# Patient Record
Sex: Female | Born: 1965
Health system: Southern US, Community
[De-identification: ages and names within clinical notes are randomized; demographics above are authoritative.]

## PROBLEM LIST (undated history)

## (undated) DIAGNOSIS — E039 Hypothyroidism, unspecified: Secondary | ICD-10-CM

## (undated) DIAGNOSIS — N926 Irregular menstruation, unspecified: Secondary | ICD-10-CM

## (undated) DIAGNOSIS — T8859XA Other complications of anesthesia, initial encounter: Secondary | ICD-10-CM

## (undated) DIAGNOSIS — K5900 Constipation, unspecified: Secondary | ICD-10-CM

## (undated) DIAGNOSIS — T4145XA Adverse effect of unspecified anesthetic, initial encounter: Secondary | ICD-10-CM

## (undated) DIAGNOSIS — Z9889 Other specified postprocedural states: Secondary | ICD-10-CM

## (undated) DIAGNOSIS — N809 Endometriosis, unspecified: Secondary | ICD-10-CM

## (undated) DIAGNOSIS — K219 Gastro-esophageal reflux disease without esophagitis: Secondary | ICD-10-CM

## (undated) DIAGNOSIS — M797 Fibromyalgia: Secondary | ICD-10-CM

## (undated) DIAGNOSIS — R112 Nausea with vomiting, unspecified: Secondary | ICD-10-CM

## (undated) DIAGNOSIS — G4700 Insomnia, unspecified: Secondary | ICD-10-CM

## (undated) DIAGNOSIS — D649 Anemia, unspecified: Secondary | ICD-10-CM

## (undated) DIAGNOSIS — R030 Elevated blood-pressure reading, without diagnosis of hypertension: Secondary | ICD-10-CM

## (undated) HISTORY — DX: Elevated blood-pressure reading, without diagnosis of hypertension: R03.0

## (undated) HISTORY — DX: Hypothyroidism, unspecified: E03.9

## (undated) HISTORY — DX: Constipation, unspecified: K59.00

## (undated) HISTORY — DX: Fibromyalgia: M79.7

## (undated) HISTORY — PX: CHOLECYSTECTOMY: SHX55

## (undated) HISTORY — DX: Irregular menstruation, unspecified: N92.6

## (undated) HISTORY — DX: Endometriosis, unspecified: N80.9

---

## 2007-01-29 ENCOUNTER — Ambulatory Visit (HOSPITAL_COMMUNITY): Admission: RE | Admit: 2007-01-29 | Discharge: 2007-01-29 | Payer: Self-pay | Admitting: Obstetrics and Gynecology

## 2008-02-03 ENCOUNTER — Other Ambulatory Visit: Admission: RE | Admit: 2008-02-03 | Discharge: 2008-02-03 | Payer: Self-pay | Admitting: Obstetrics and Gynecology

## 2008-04-27 ENCOUNTER — Ambulatory Visit (HOSPITAL_COMMUNITY): Admission: RE | Admit: 2008-04-27 | Discharge: 2008-04-27 | Payer: Self-pay | Admitting: Obstetrics and Gynecology

## 2009-02-16 ENCOUNTER — Other Ambulatory Visit: Admission: RE | Admit: 2009-02-16 | Discharge: 2009-02-16 | Payer: Self-pay | Admitting: Obstetrics and Gynecology

## 2009-02-16 ENCOUNTER — Encounter: Admission: RE | Admit: 2009-02-16 | Discharge: 2009-02-16 | Payer: Self-pay | Admitting: Allergy and Immunology

## 2009-02-18 ENCOUNTER — Ambulatory Visit (HOSPITAL_COMMUNITY): Admission: RE | Admit: 2009-02-18 | Discharge: 2009-02-18 | Payer: Self-pay | Admitting: Obstetrics & Gynecology

## 2009-09-03 ENCOUNTER — Inpatient Hospital Stay (HOSPITAL_COMMUNITY): Admission: EM | Admit: 2009-09-03 | Discharge: 2009-09-06 | Payer: Self-pay | Admitting: Emergency Medicine

## 2009-09-05 ENCOUNTER — Encounter (INDEPENDENT_AMBULATORY_CARE_PROVIDER_SITE_OTHER): Payer: Self-pay | Admitting: Internal Medicine

## 2010-03-09 ENCOUNTER — Other Ambulatory Visit: Admission: RE | Admit: 2010-03-09 | Discharge: 2010-03-09 | Payer: Self-pay | Admitting: Obstetrics and Gynecology

## 2010-03-15 ENCOUNTER — Ambulatory Visit (HOSPITAL_COMMUNITY): Admission: RE | Admit: 2010-03-15 | Discharge: 2010-03-15 | Payer: Self-pay | Admitting: Obstetrics & Gynecology

## 2010-05-03 ENCOUNTER — Ambulatory Visit (HOSPITAL_COMMUNITY)
Admission: RE | Admit: 2010-05-03 | Discharge: 2010-05-03 | Payer: Self-pay | Source: Home / Self Care | Attending: Obstetrics & Gynecology | Admitting: Obstetrics & Gynecology

## 2010-05-22 ENCOUNTER — Encounter: Payer: Self-pay | Admitting: Family Medicine

## 2010-07-19 LAB — COMPREHENSIVE METABOLIC PANEL
ALT: 44 U/L — ABNORMAL HIGH (ref 0–35)
ALT: 80 U/L — ABNORMAL HIGH (ref 0–35)
AST: 106 U/L — ABNORMAL HIGH (ref 0–37)
AST: 26 U/L (ref 0–37)
Albumin: 2.8 g/dL — ABNORMAL LOW (ref 3.5–5.2)
Albumin: 2.8 g/dL — ABNORMAL LOW (ref 3.5–5.2)
Albumin: 3.5 g/dL (ref 3.5–5.2)
Alkaline Phosphatase: 39 U/L (ref 39–117)
Alkaline Phosphatase: 51 U/L (ref 39–117)
Alkaline Phosphatase: 78 U/L (ref 39–117)
BUN: 4 mg/dL — ABNORMAL LOW (ref 6–23)
BUN: 6 mg/dL (ref 6–23)
CO2: 22 mEq/L (ref 19–32)
Calcium: 7.8 mg/dL — ABNORMAL LOW (ref 8.4–10.5)
Calcium: 8.6 mg/dL (ref 8.4–10.5)
Chloride: 109 mEq/L (ref 96–112)
Chloride: 109 mEq/L (ref 96–112)
Chloride: 110 mEq/L (ref 96–112)
Creatinine, Ser: 0.64 mg/dL (ref 0.4–1.2)
Creatinine, Ser: 0.74 mg/dL (ref 0.4–1.2)
GFR calc Af Amer: >60 mL/min (ref >60)
GFR calc Af Amer: >60 mL/min (ref >60)
GFR calc Af Amer: >60 mL/min (ref >60)
GFR calc non Af Amer: >60 mL/min (ref >60)
Glucose, Bld: 106 mg/dL — ABNORMAL HIGH (ref 70–99)
Glucose, Bld: 89 mg/dL (ref 70–99)
Potassium: 3.6 mEq/L (ref 3.5–5.1)
Potassium: 4.3 mEq/L (ref 3.5–5.1)
Sodium: 138 mEq/L (ref 135–145)
Sodium: 139 mEq/L (ref 135–145)
Total Bilirubin: 0.3 mg/dL (ref 0.3–1.2)
Total Bilirubin: 0.8 mg/dL (ref 0.3–1.2)
Total Bilirubin: 1.1 mg/dL (ref 0.3–1.2)
Total Protein: 5.7 g/dL — ABNORMAL LOW (ref 6.0–8.3)
Total Protein: 7 g/dL (ref 6.0–8.3)

## 2010-07-19 LAB — DIFFERENTIAL
Basophils Absolute: 0 10*3/uL (ref 0.0–0.1)
Basophils Relative: 0 % (ref 0–1)
Eosinophils Absolute: 0.1 10*3/uL (ref 0.0–0.7)
Eosinophils Relative: 1 % (ref 0–5)
Lymphocytes Relative: 28 % (ref 12–46)
Lymphs Abs: 2.4 10*3/uL (ref 0.7–4.0)
Lymphs Abs: 3.2 10*3/uL (ref 0.7–4.0)
Monocytes Absolute: 0.4 10*3/uL (ref 0.1–1.0)
Monocytes Absolute: 0.8 10*3/uL (ref 0.1–1.0)
Monocytes Relative: 7 % (ref 3–12)
Neutro Abs: 5.5 10*3/uL (ref 1.7–7.7)

## 2010-07-19 LAB — CBC
HCT: 29.3 % — ABNORMAL LOW (ref 36.0–46.0)
HCT: 31.2 % — ABNORMAL LOW (ref 36.0–46.0)
Hemoglobin: 12.4 g/dL (ref 12.0–15.0)
Hemoglobin: 9.7 g/dL — ABNORMAL LOW (ref 12.0–15.0)
Hemoglobin: 9.7 g/dL — ABNORMAL LOW (ref 12.0–15.0)
MCHC: 32.3 g/dL (ref 30.0–36.0)
MCHC: 33.1 g/dL (ref 30.0–36.0)
MCV: 87.5 fL (ref 78.0–100.0)
MCV: 87.8 fL (ref 78.0–100.0)
MCV: 88.1 fL (ref 78.0–100.0)
Platelets: 201 10*3/uL (ref 150–400)
Platelets: 208 10*3/uL (ref 150–400)
RBC: 3.38 MIL/uL — ABNORMAL LOW (ref 3.87–5.11)
RDW: 13.5 % (ref 11.5–15.5)
RDW: 13.6 % (ref 11.5–15.5)
RDW: 13.7 % (ref 11.5–15.5)
WBC: 6.6 10*3/uL (ref 4.0–10.5)
WBC: 8.4 10*3/uL (ref 4.0–10.5)

## 2010-07-19 LAB — URINALYSIS, ROUTINE W REFLEX MICROSCOPIC
Bilirubin Urine: NEGATIVE
Glucose, UA: NEGATIVE mg/dL
Ketones, ur: NEGATIVE mg/dL
Nitrite: NEGATIVE
Specific Gravity, Urine: 1.003 — ABNORMAL LOW (ref 1.005–1.030)
pH: 7 (ref 5.0–8.0)

## 2010-07-19 LAB — URINE MICROSCOPIC-ADD ON

## 2010-07-19 LAB — PREGNANCY, URINE: Preg Test, Ur: NEGATIVE

## 2010-07-19 LAB — LIPASE, BLOOD
Lipase: 22 U/L (ref 11–59)
Lipase: 647 U/L — ABNORMAL HIGH (ref 11–59)

## 2011-04-18 IMAGING — RF DG CHOLANGIOGRAM OPERATIVE
1 series · 4 of 4 positions shown · non-contrast
Comparison: None.

CLINICAL DATA: Gallstones

INTRAOPERATIVE CHOLANGIOGRAM
TECHNIQUE: Cholangiographic images from the C-arm fluoroscopic
device were submitted for interpretation post-operatively.  Please
see the procedural report for the amount of contrast and the
fluoroscopy time utilized.

[Series 1: run · 4 of 92 frames shown]
[frame 2/92]
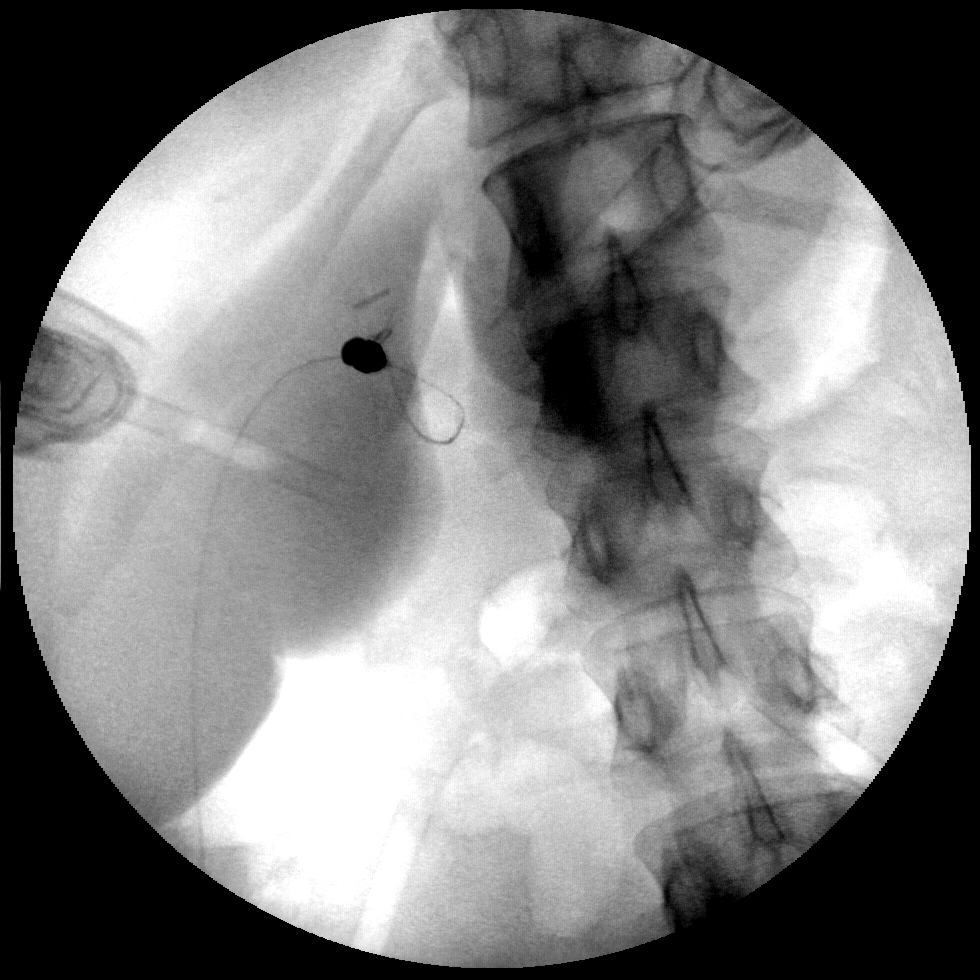
[frame 14/92]
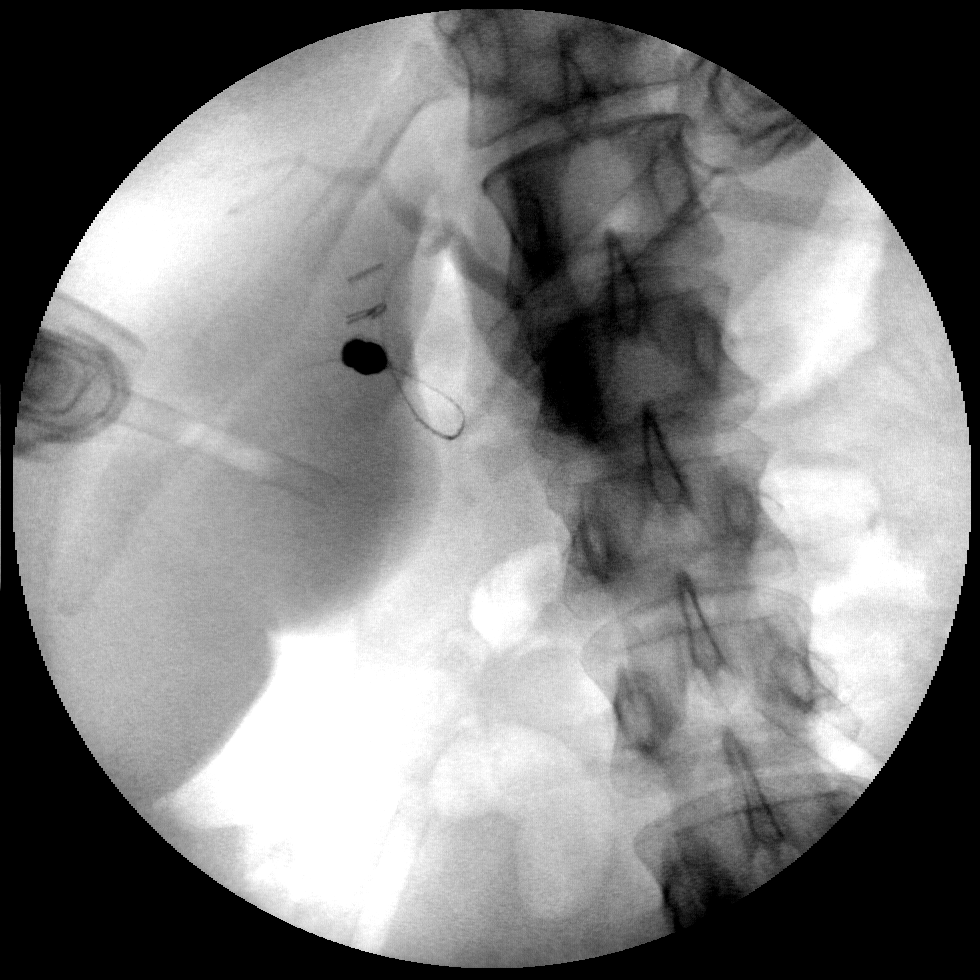
[frame 47/92]
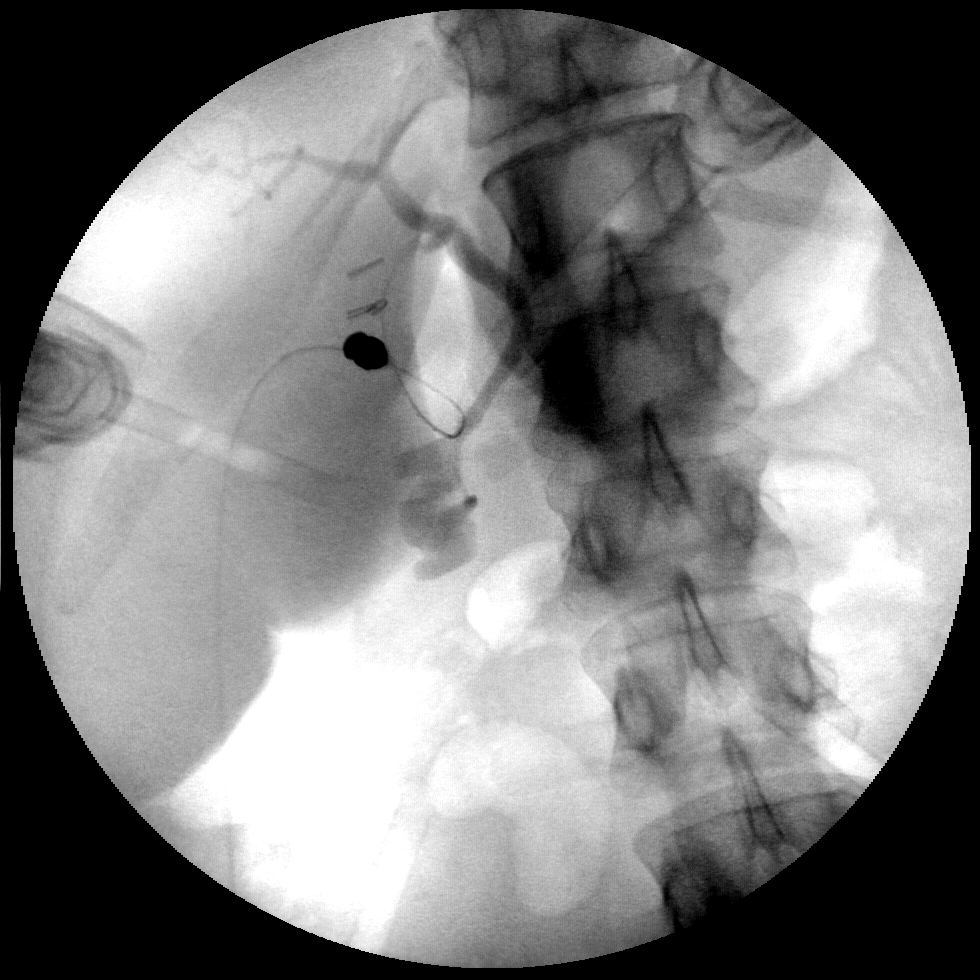
[frame 79/92]
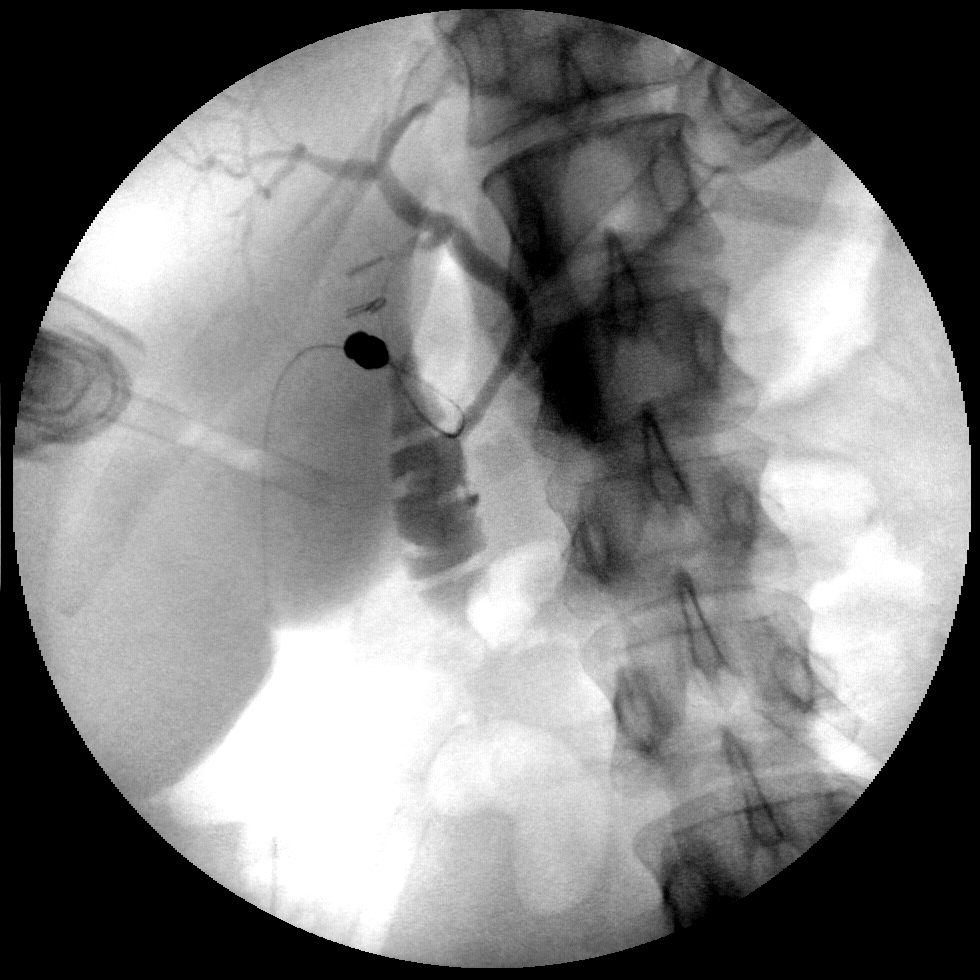

[4 of 4 positions shown; findings below may reference images not displayed]

FINDINGS: Contrast fills the biliary tree and duodenum compatible
patency.  No filling defects are present to suggest duct stones.
IMPRESSION: Biliary system is patent.  No definite duct stones.

## 2011-04-25 ENCOUNTER — Encounter: Payer: Self-pay | Admitting: Emergency Medicine

## 2011-04-25 ENCOUNTER — Emergency Department (HOSPITAL_COMMUNITY): Payer: 59

## 2011-04-25 ENCOUNTER — Other Ambulatory Visit: Payer: Self-pay

## 2011-04-25 ENCOUNTER — Emergency Department (HOSPITAL_COMMUNITY)
Admission: EM | Admit: 2011-04-25 | Discharge: 2011-04-25 | Disposition: A | Discharge status: Home / Self Care | Payer: 59 | Attending: Emergency Medicine | Admitting: Emergency Medicine

## 2011-04-25 DIAGNOSIS — K219 Gastro-esophageal reflux disease without esophagitis: Secondary | ICD-10-CM | POA: Insufficient documentation

## 2011-04-25 DIAGNOSIS — J45909 Unspecified asthma, uncomplicated: Secondary | ICD-10-CM | POA: Insufficient documentation

## 2011-04-25 DIAGNOSIS — R6884 Jaw pain: Secondary | ICD-10-CM | POA: Insufficient documentation

## 2011-04-25 DIAGNOSIS — R079 Chest pain, unspecified: Secondary | ICD-10-CM | POA: Insufficient documentation

## 2011-04-25 DIAGNOSIS — R112 Nausea with vomiting, unspecified: Secondary | ICD-10-CM | POA: Insufficient documentation

## 2011-04-25 DIAGNOSIS — E039 Hypothyroidism, unspecified: Secondary | ICD-10-CM | POA: Insufficient documentation

## 2011-04-25 DIAGNOSIS — J029 Acute pharyngitis, unspecified: Secondary | ICD-10-CM | POA: Insufficient documentation

## 2011-04-25 DIAGNOSIS — H9209 Otalgia, unspecified ear: Secondary | ICD-10-CM | POA: Insufficient documentation

## 2011-04-25 DIAGNOSIS — R109 Unspecified abdominal pain: Secondary | ICD-10-CM | POA: Insufficient documentation

## 2011-04-25 HISTORY — DX: Gastro-esophageal reflux disease without esophagitis: K21.9

## 2011-04-25 HISTORY — DX: Anemia, unspecified: D64.9

## 2011-04-25 HISTORY — DX: Insomnia, unspecified: G47.00

## 2011-04-25 HISTORY — DX: Hypothyroidism, unspecified: E03.9

## 2011-04-25 LAB — COMPREHENSIVE METABOLIC PANEL
AST: 12 U/L (ref 0–37)
Albumin: 3.2 g/dL — ABNORMAL LOW (ref 3.5–5.2)
BUN: 11 mg/dL (ref 6–23)
Calcium: 8.8 mg/dL (ref 8.4–10.5)
Creatinine, Ser: 0.66 mg/dL (ref 0.50–1.10)
Total Bilirubin: 0.3 mg/dL (ref 0.3–1.2)
Total Protein: 7 g/dL (ref 6.0–8.3)

## 2011-04-25 LAB — URINALYSIS, ROUTINE W REFLEX MICROSCOPIC
Glucose, UA: NEGATIVE mg/dL
Ketones, ur: NEGATIVE mg/dL
Leukocytes, UA: NEGATIVE
Nitrite: NEGATIVE
Protein, ur: NEGATIVE mg/dL

## 2011-04-25 LAB — DIFFERENTIAL
Basophils Absolute: 0 10*3/uL (ref 0.0–0.1)
Lymphocytes Relative: 20 % (ref 12–46)
Lymphs Abs: 3.4 10*3/uL (ref 0.7–4.0)
Monocytes Absolute: 1.1 10*3/uL — ABNORMAL HIGH (ref 0.1–1.0)
Neutro Abs: 12.2 10*3/uL — ABNORMAL HIGH (ref 1.7–7.7)

## 2011-04-25 LAB — CBC
HCT: 35.7 % — ABNORMAL LOW (ref 36.0–46.0)
Hemoglobin: 11.8 g/dL — ABNORMAL LOW (ref 12.0–15.0)
MCV: 84.4 fL (ref 78.0–100.0)
RBC: 4.23 MIL/uL (ref 3.87–5.11)
RDW: 13.7 % (ref 11.5–15.5)
WBC: 16.8 10*3/uL — ABNORMAL HIGH (ref 4.0–10.5)

## 2011-04-25 LAB — POCT PREGNANCY, URINE: Preg Test, Ur: NEGATIVE

## 2011-04-25 LAB — URINE MICROSCOPIC-ADD ON

## 2011-04-25 LAB — LIPASE, BLOOD: Lipase: 26 U/L (ref 11–59)

## 2011-04-25 MED ORDER — ONDANSETRON HCL 4 MG PO TABS: 4.0000 mg | ORAL_TABLET | Freq: Four times a day (QID) | ORAL | Status: AC | PRN

## 2011-04-25 MED ORDER — GI COCKTAIL ~~LOC~~
ORAL | Status: AC
  Filled 2011-04-25: qty 30

## 2011-04-25 MED ORDER — ONDANSETRON 4 MG PO TBDP
ORAL_TABLET | ORAL | Status: AC
  Filled 2011-04-25: qty 2

## 2011-04-25 MED ORDER — GI COCKTAIL ~~LOC~~
30.0000 mL | Freq: Once | ORAL | Status: AC
  Administered 2011-04-25: 30 mL via ORAL

## 2011-04-25 MED ORDER — SODIUM CHLORIDE 0.9 % IV BOLUS (SEPSIS)
1000.0000 mL | Freq: Once | INTRAVENOUS | Status: AC
  Administered 2011-04-25: 1000 mL via INTRAVENOUS

## 2011-04-25 MED ORDER — ONDANSETRON 4 MG PO TBDP
8.0000 mg | ORAL_TABLET | Freq: Once | ORAL | Status: AC
  Administered 2011-04-25: 8 mg via ORAL

## 2011-04-25 NOTE — ED Notes (Signed)
Pt reports became nauseated and threw up once. Reports feeling of something in throat but cannot clear it. Pain in ears, back, behind neck, shaky but not cold. Pt reports jaw and throat sore especially when swallowing.

## 2011-04-25 NOTE — ED Provider Notes (Signed)
History     CSN: [id number]  Arrival date & time 04/25/11  0123    Chief Complaint  Patient presents with  . Abdominal Pain    HPI Pt was seen at 0145.  Per pt, c/o sudden onset and resolution of one episode of N/V that began PTA.  Pt states she has felt "shakey" since vomiting, as well as has pain in the side of her jaw, ears, sore throat as well as her lower lateral ribs.  Sore throat worsens with swallowing.  Denies diarrhea, no CP/palpitations, no SOB/cough, no fevers, no back pain, no rash.     Past Medical History  Diagnosis Date  . Asthma   . GERD (gastroesophageal reflux disease)   . Insomnia   . Anemia   . Hypothyroidism     Past Surgical History  Procedure Date  . Cholecystectomy     History  Substance Use Topics  . Smoking status: Never Smoker   . Smokeless tobacco: Not on file  . Alcohol Use: Yes   Review of Systems ROS: Statement: All systems negative except as marked or noted in the HPI; Constitutional: Negative for fever and chills. ; ; Eyes: Negative for eye pain, redness and discharge. ; ; ENMT: Negative for ear pain, hoarseness, nasal congestion, sinus pressure and +sore throat. ; ; Cardiovascular: Negative for chest pain, palpitations, diaphoresis, dyspnea and peripheral edema. ; ; Respiratory: Negative for cough, wheezing and stridor. ; ; Gastrointestinal: +N/V.  Negative for diarrhea, abdominal pain, blood in stool, hematemesis, jaundice and rectal bleeding. . ; ; Genitourinary: Negative for dysuria, flank pain and hematuria. ; ; Musculoskeletal: Negative for back pain and neck pain. Negative for swelling and trauma.; ; Skin: Negative for pruritus, rash, abrasions, blisters, bruising and skin lesion.; ; Neuro: Negative for headache, lightheadedness and neck stiffness. Negative for weakness, altered level of consciousness , altered mental status, extremity weakness, paresthesias, involuntary movement, seizure and syncope.     Allergies  Review of  patient's allergies indicates no known allergies.  Home Medications   Current Outpatient Rx  Name Route Sig Dispense Refill  . ALBUTEROL SULFATE HFA 108 (90 BASE) MCG/ACT IN AERS Inhalation Inhale 2 puffs into the lungs every 6 (six) hours as needed. For breathing     . BUDESONIDE-FORMOTEROL FUMARATE 80-4.5 MCG/ACT IN AERO Inhalation Inhale 2 puffs into the lungs 2 (two) times daily.      Marland Kitchen LEVOTHYROXINE SODIUM 100 MCG PO TABS Oral Take 100 mcg by mouth daily.      Marland Kitchen LORATADINE 10 MG PO TABS Oral Take 10 mg by mouth daily.      Vladimir Creeks ESTRAD-FE 1-20/1-30/1-35 MG-MCG PO TABS Oral Take 1 tablet by mouth daily.        BP 154/85  Pulse 131  Temp(Src) 98.2 F (36.8 C) (Oral)  Resp 18  SpO2 97%  Physical Exam 0150: Physical examination:  Nursing notes reviewed; Vital signs and O2 SAT reviewed;  Constitutional: Well developed, Well nourished, Well hydrated, In no acute distress; Head:  Normocephalic, atraumatic; Eyes: EOMI, PERRL, No scleral icterus; ENMT: Mouth and pharynx normal, Mucous membranes moist; Neck: Supple, Full range of motion, No lymphadenopathy; Cardiovascular: Tachycardic rate and rhythm, No murmur or gallop; Respiratory: Breath sounds clear & equal bilaterally, No rales, rhonchi, wheezes, or rub, Normal respiratory effort/excursion; Chest: Nontender, Movement normal; Abdomen: Soft, Nontender, Nondistended, Normal bowel sounds; Genitourinary: No CVA tenderness; Extremities: Pulses normal, No tenderness, No edema, No calf edema or asymmetry.; Neuro: AA&Ox3, Major CN grossly  intact.  No gross focal motor or sensory deficits in extremities.; Skin: Color normal, Warm, Dry, no rash.  Psych:  Very anxious.    ED Course  Procedures    MDM  MDM Reviewed: nursing note and vitals Interpretation: ECG, labs and x-ray    Date: 04/25/2011  Rate: 93  Rhythm: normal sinus rhythm  QRS Axis: normal  Intervals: normal  ST/T Wave abnormalities: normal  Conduction  Disutrbances:none  Narrative Interpretation:   Old EKG Reviewed: none available.  Results for orders placed during the hospital encounter of 04/25/11  URINALYSIS, ROUTINE W REFLEX MICROSCOPIC      Component Value Range   Color, Urine YELLOW  YELLOW    APPearance TURBID (*) CLEAR    Specific Gravity, Urine 1.030  1.005 - 1.030    pH 5.0  5.0 - 8.0    Glucose, UA NEGATIVE  NEGATIVE (mg/dL)   Hgb urine dipstick TRACE (*) NEGATIVE    Bilirubin Urine NEGATIVE  NEGATIVE    Ketones, ur NEGATIVE  NEGATIVE (mg/dL)   Protein, ur NEGATIVE  NEGATIVE (mg/dL)   Urobilinogen, UA 0.2  0.0 - 1.0 (mg/dL)   Nitrite NEGATIVE  NEGATIVE    Leukocytes, UA NEGATIVE  NEGATIVE   POCT PREGNANCY, URINE      Component Value Range   Preg Test, Ur NEGATIVE    COMPREHENSIVE METABOLIC PANEL      Component Value Range   Sodium 136  135 - 145 (mEq/L)   Potassium 3.4 (*) 3.5 - 5.1 (mEq/L)   Chloride 102  96 - 112 (mEq/L)   CO2 22  19 - 32 (mEq/L)   Glucose, Bld 116 (*) 70 - 99 (mg/dL)   BUN 11  6 - 23 (mg/dL)   Creatinine, Ser 6.96  0.50 - 1.10 (mg/dL)   Calcium 8.8  8.4 - 29.5 (mg/dL)   Total Protein 7.0  6.0 - 8.3 (g/dL)   Albumin 3.2 (*) 3.5 - 5.2 (g/dL)   AST 12  0 - 37 (U/L)   ALT 10  0 - 35 (U/L)   Alkaline Phosphatase 46  39 - 117 (U/L)   Total Bilirubin 0.3  0.3 - 1.2 (mg/dL)   GFR calc non Af Amer >90  >90 (mL/min)   GFR calc Af Amer >90  >90 (mL/min)  LIPASE, BLOOD      Component Value Range   Lipase 26  11 - 59 (U/L)  CBC      Component Value Range   WBC 16.8 (*) 4.0 - 10.5 (K/uL)   RBC 4.23  3.87 - 5.11 (MIL/uL)   Hemoglobin 11.8 (*) 12.0 - 15.0 (g/dL)   HCT 28.4 (*) 13.2 - 46.0 (%)   MCV 84.4  78.0 - 100.0 (fL)   MCH 27.9  26.0 - 34.0 (pg)   MCHC 33.1  30.0 - 36.0 (g/dL)   RDW 44.0  10.2 - 72.5 (%)   Platelets 248  150 - 400 (K/uL)  DIFFERENTIAL      Component Value Range   Neutrophils Relative 72  43 - 77 (%)   Neutro Abs 12.2 (*) 1.7 - 7.7 (K/uL)   Lymphocytes Relative 20  12 -  46 (%)   Lymphs Abs 3.4  0.7 - 4.0 (K/uL)   Monocytes Relative 6  3 - 12 (%)   Monocytes Absolute 1.1 (*) 0.1 - 1.0 (K/uL)   Eosinophils Relative 1  0 - 5 (%)   Eosinophils Absolute 0.2  0.0 - 0.7 (K/uL)   Basophils  Relative 0  0 - 1 (%)   Basophils Absolute 0.0  0.0 - 0.1 (K/uL)  RAPID STREP SCREEN      Component Value Range   Streptococcus, Group A Screen (Direct) NEGATIVE  NEGATIVE   URINE MICROSCOPIC-ADD ON      Component Value Range   Squamous Epithelial / LPF MANY (*) RARE    WBC, UA 0-2  <3 (WBC/hpf)   RBC / HPF 3-6  <3 (RBC/hpf)   Bacteria, UA MANY (*) RARE    Urine-Other MUCOUS PRESENT    POCT I-STAT TROPONIN I      Component Value Range   Troponin i, poc 0.00  0.00 - 0.08 (ng/mL)   Comment 3            Dg Neck Soft Tissue 04/25/2011  *RADIOLOGY REPORT*  Clinical Data: Throat pain; multiple episodes of vomiting.  NECK SOFT TISSUES - 1+ VIEW  Comparison: None.  Findings: The nasopharynx, oropharynx and hypopharynx are unremarkable in appearance.  The epiglottis appears grossly normal in thickness, though not well assessed due to its position.  The aryepiglottic folds are within normal limits.  The proximal trachea is normal in appearance.  Prevertebral soft tissues are normal in thickness.  The visualized paranasal sinuses and mastoid air cells are well- aerated.  No acute osseous abnormalities are seen.  Mild degenerative change is noted about the dens.  IMPRESSION: No significant abnormalities seen with respect to the soft tissues of the neck.  Original Report Authenticated By: Tonia Ghent, M.D.   Dg Chest 2 View 04/25/2011  *RADIOLOGY REPORT*  Clinical Data: Shortness of breath and jaw pain.  CHEST - 2 VIEW  Comparison: Chest radiograph performed 02/16/2009  Findings: The lungs are well-aerated and clear.  There is no evidence of focal opacification, pleural effusion or pneumothorax.  The heart is borderline normal in size; the mediastinal contour is within normal limits.  No  acute osseous abnormalities are seen. Clips are noted within the right upper quadrant, reflecting prior cholecystectomy.  IMPRESSION: No acute cardiopulmonary process seen.  Original Report Authenticated By: Tonia Ghent, M.D.    3:47 AM:  UA contaminated.  Feels better after meds.  Has tol PO well in ED without N/V.  Pt has ambulated to the BR with easy resps, steady gait, NAD.  Appears less anxious.  HR improved to 90's.  Wants to go home now.  Dx testing d/w pt and family.  Questions answered.  Verb understanding, agreeable to d/c home with outpt f/u.       Brett Darko Allison Quarry, DO 04/26/11 [phone]

## 2011-04-25 NOTE — ED Notes (Signed)
Pt reports no nausea at this time. Pt reports tenderness under ribs.

## 2011-04-25 NOTE — ED Notes (Signed)
Patient transported to X-ray 

## 2011-04-25 NOTE — ED Notes (Signed)
EKG GIVEN TO DR [facility]

## 2011-05-08 ENCOUNTER — Other Ambulatory Visit: Payer: Self-pay | Admitting: Obstetrics and Gynecology

## 2011-05-08 DIAGNOSIS — Z139 Encounter for screening, unspecified: Secondary | ICD-10-CM

## 2011-05-22 ENCOUNTER — Ambulatory Visit (HOSPITAL_COMMUNITY)
Admission: RE | Admit: 2011-05-22 | Discharge: 2011-05-22 | Disposition: A | Discharge status: Home / Self Care | Payer: 59 | Source: Ambulatory Visit | Attending: Obstetrics and Gynecology | Admitting: Obstetrics and Gynecology

## 2011-05-22 DIAGNOSIS — Z139 Encounter for screening, unspecified: Secondary | ICD-10-CM

## 2011-05-22 DIAGNOSIS — Z1231 Encounter for screening mammogram for malignant neoplasm of breast: Secondary | ICD-10-CM | POA: Insufficient documentation

## 2011-06-05 ENCOUNTER — Other Ambulatory Visit (HOSPITAL_COMMUNITY)
Admission: RE | Admit: 2011-06-05 | Discharge: 2011-06-05 | Disposition: A | Discharge status: Home / Self Care | Payer: 59 | Source: Ambulatory Visit | Attending: Obstetrics and Gynecology | Admitting: Obstetrics and Gynecology

## 2011-06-05 ENCOUNTER — Other Ambulatory Visit: Payer: Self-pay | Admitting: Adult Health

## 2011-06-05 DIAGNOSIS — Z01419 Encounter for gynecological examination (general) (routine) without abnormal findings: Secondary | ICD-10-CM | POA: Insufficient documentation

## 2011-06-05 DIAGNOSIS — Z113 Encounter for screening for infections with a predominantly sexual mode of transmission: Secondary | ICD-10-CM | POA: Insufficient documentation

## 2012-03-30 ENCOUNTER — Other Ambulatory Visit (HOSPITAL_COMMUNITY): Payer: Self-pay | Admitting: Family Medicine

## 2012-03-30 ENCOUNTER — Ambulatory Visit (HOSPITAL_COMMUNITY)
Admission: RE | Admit: 2012-03-30 | Discharge: 2012-03-30 | Disposition: A | Discharge status: Home / Self Care | Payer: 59 | Source: Ambulatory Visit | Attending: Family Medicine | Admitting: Family Medicine

## 2012-03-30 DIAGNOSIS — S8990XA Unspecified injury of unspecified lower leg, initial encounter: Secondary | ICD-10-CM | POA: Insufficient documentation

## 2012-03-30 DIAGNOSIS — S99919A Unspecified injury of unspecified ankle, initial encounter: Secondary | ICD-10-CM | POA: Insufficient documentation

## 2012-03-30 DIAGNOSIS — X58XXXA Exposure to other specified factors, initial encounter: Secondary | ICD-10-CM | POA: Insufficient documentation

## 2012-03-30 DIAGNOSIS — T1490XA Injury, unspecified, initial encounter: Secondary | ICD-10-CM

## 2012-05-06 ENCOUNTER — Other Ambulatory Visit: Payer: Self-pay | Admitting: Adult Health

## 2012-05-06 DIAGNOSIS — Z139 Encounter for screening, unspecified: Secondary | ICD-10-CM

## 2012-05-28 ENCOUNTER — Ambulatory Visit (HOSPITAL_COMMUNITY)
Admission: RE | Admit: 2012-05-28 | Discharge: 2012-05-28 | Disposition: A | Discharge status: Home / Self Care | Payer: 59 | Source: Ambulatory Visit | Attending: Adult Health | Admitting: Adult Health

## 2012-05-28 DIAGNOSIS — Z1231 Encounter for screening mammogram for malignant neoplasm of breast: Secondary | ICD-10-CM | POA: Insufficient documentation

## 2012-05-28 DIAGNOSIS — Z139 Encounter for screening, unspecified: Secondary | ICD-10-CM

## 2012-12-05 ENCOUNTER — Encounter: Payer: Self-pay | Admitting: Adult Health

## 2012-12-05 ENCOUNTER — Ambulatory Visit (INDEPENDENT_AMBULATORY_CARE_PROVIDER_SITE_OTHER): Payer: 59 | Admitting: Adult Health

## 2012-12-05 VITALS — BP 120/88 | Ht 60.0 in | Wt 203.0 lb

## 2012-12-05 DIAGNOSIS — E039 Hypothyroidism, unspecified: Secondary | ICD-10-CM

## 2012-12-05 DIAGNOSIS — K5900 Constipation, unspecified: Secondary | ICD-10-CM

## 2012-12-05 DIAGNOSIS — N926 Irregular menstruation, unspecified: Secondary | ICD-10-CM

## 2012-12-05 HISTORY — DX: Constipation, unspecified: K59.00

## 2012-12-05 HISTORY — DX: Hypothyroidism, unspecified: E03.9

## 2012-12-05 HISTORY — DX: Irregular menstruation, unspecified: N92.6

## 2012-12-05 LAB — CBC
HCT: 36.1 % (ref 36.0–46.0)
Hemoglobin: 11.7 g/dL — ABNORMAL LOW (ref 12.0–15.0)
MCV: 84.3 fL (ref 78.0–100.0)
RDW: 14.3 % (ref 11.5–15.5)
WBC: 10.7 10*3/uL — ABNORMAL HIGH (ref 4.0–10.5)

## 2012-12-05 IMAGING — CR DG CHEST 2V
2 series · 2 of 2 positions shown · non-contrast
Comparison: Chest radiograph performed 02/16/2009

CLINICAL DATA: Shortness of breath and jaw pain.

CHEST - 2 VIEW

[w chest pa]
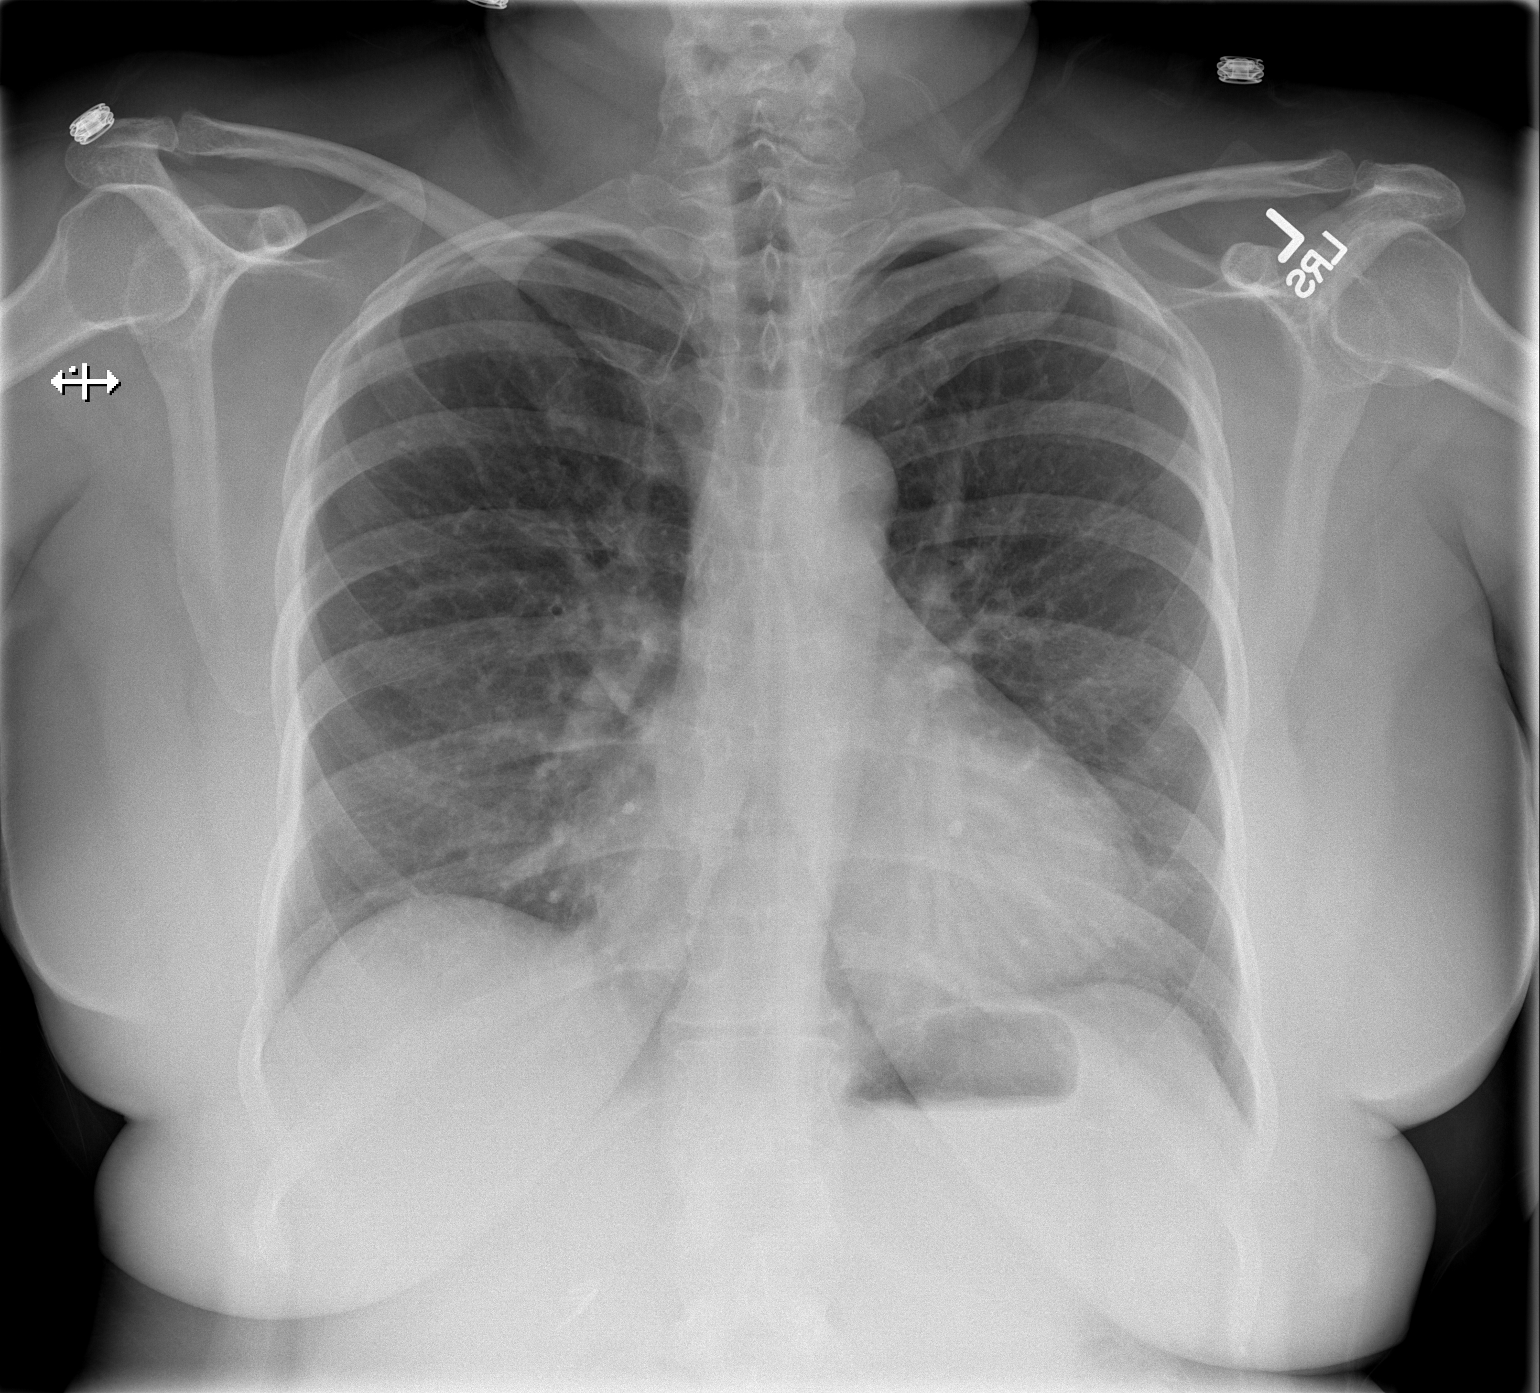

[w chest lat]
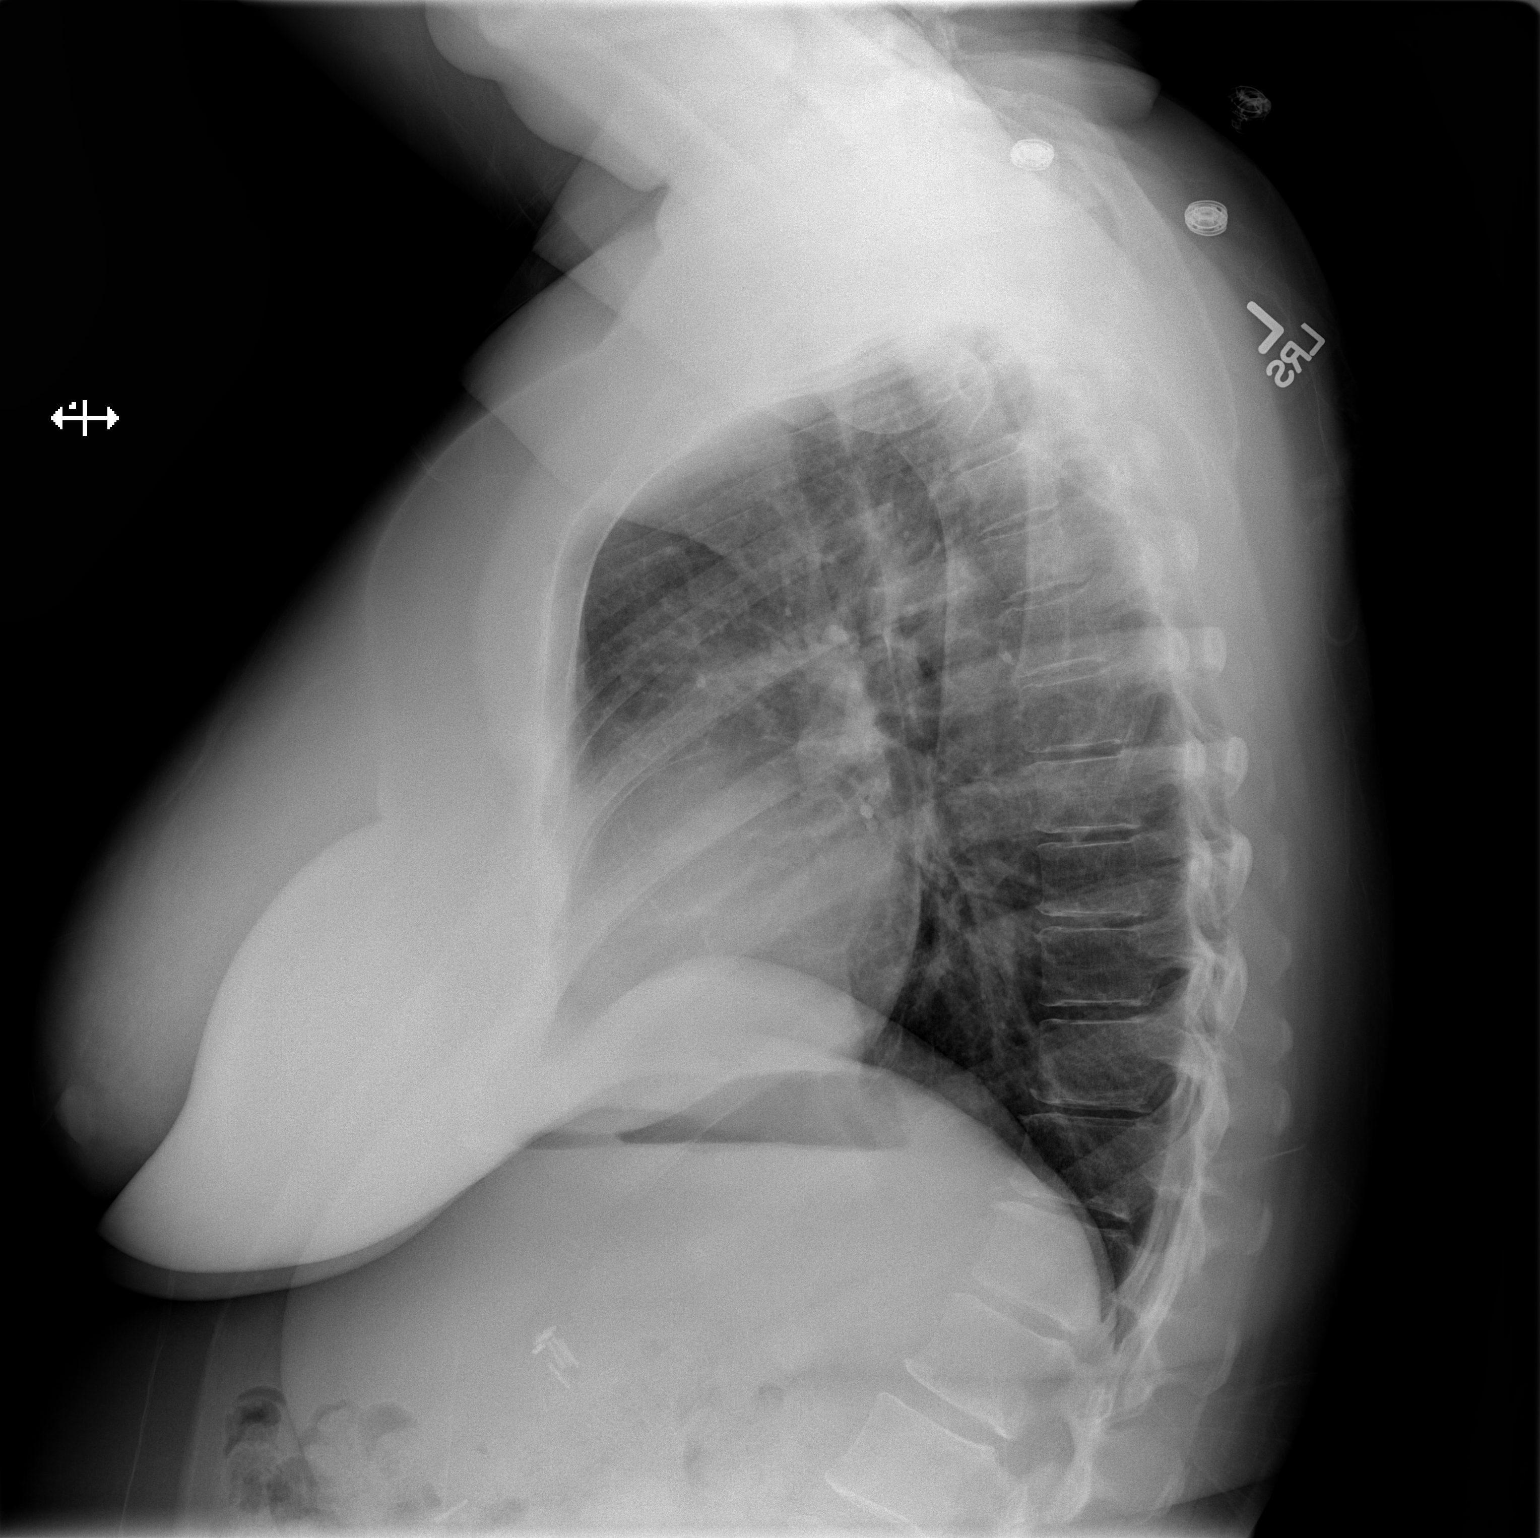

[2 of 2 positions shown; findings below may reference images not displayed]

FINDINGS: The lungs are well-aerated and clear.  There is no
evidence of focal opacification, pleural effusion or pneumothorax.

The heart is borderline normal in size; the mediastinal contour is
within normal limits.  No acute osseous abnormalities are seen.
Clips are noted within the right upper quadrant, reflecting prior
cholecystectomy.
IMPRESSION: No acute cardiopulmonary process seen.

## 2012-12-05 NOTE — Patient Instructions (Addendum)

## 2012-12-05 NOTE — Progress Notes (Signed)
Subjective:     Patient ID: Valerie Henderson, female   DOB: 11/20/1965, 47 y.o.   MRN: [id number]  HPI Valerie Henderson is a 47 year old white female married in today complaining of irregular bleeding, tired with decrease energy,can not get to sleep, constipated, bloating, dizziness at times and achy at times,pain with sex and teary at times.And some occasional hot flashes and weight gain. She is on synthroid and estrostep.   Review of Systems Positives in HPI Reviewed past medical,surgical, social and family history. Reviewed medications and allergies.     Objective:   Physical Exam BP 120/88  Ht 5' (1.524 m)  Wt 203 lb (92.08 kg)  BMI 39.65 kg/m2  LMP 11/24/2012   Skin warm and dry.Pelvic: external genitalia is normal in appearance, vagina: pinkish discharge without odor, cervix:smooth and bulbous, uterus: slightly enlarged, shape and contour, non tender, no masses felt, adnexa: no masses or tenderness noted.   Assessment:     Irregular bleeding Hypothyroid Constipation Fatigue Peri menopausal symptoms    Plan:     Continue OCs  Check CBC,CMP,TSH Return in 1 week for pelvic US and see me, will discuss options then Review handout on DUB

## 2012-12-06 LAB — COMPREHENSIVE METABOLIC PANEL
AST: 12 U/L (ref 0–37)
BUN: 10 mg/dL (ref 6–23)
Calcium: 9.1 mg/dL (ref 8.4–10.5)
Chloride: 103 mEq/L (ref 96–112)
Creat: 0.71 mg/dL (ref 0.50–1.10)
Glucose, Bld: 79 mg/dL (ref 70–99)

## 2012-12-06 LAB — TSH: TSH: 3.662 u[IU]/mL (ref 0.350–4.500)

## 2012-12-09 ENCOUNTER — Telehealth: Payer: Self-pay | Admitting: Adult Health

## 2012-12-09 NOTE — Telephone Encounter (Signed)
Spoke with the pt and she stated that she has stopped bleeding but should be starting a period next week. Pt feels very drained. Pt states she is supposed to have an [place] on the 18th do you want her to continue on with that? Does she need OTC iron? Pt aware of results.

## 2012-12-10 NOTE — Telephone Encounter (Signed)
Keep [place] appt and it is ok to take MV with Fe

## 2012-12-16 ENCOUNTER — Ambulatory Visit (INDEPENDENT_AMBULATORY_CARE_PROVIDER_SITE_OTHER): Payer: 59 | Admitting: Adult Health

## 2012-12-16 ENCOUNTER — Ambulatory Visit (INDEPENDENT_AMBULATORY_CARE_PROVIDER_SITE_OTHER): Payer: 59

## 2012-12-16 ENCOUNTER — Encounter: Payer: Self-pay | Admitting: Adult Health

## 2012-12-16 VITALS — BP 120/82 | Ht 60.0 in | Wt 204.0 lb

## 2012-12-16 DIAGNOSIS — N926 Irregular menstruation, unspecified: Secondary | ICD-10-CM

## 2012-12-16 DIAGNOSIS — D259 Leiomyoma of uterus, unspecified: Secondary | ICD-10-CM

## 2012-12-16 DIAGNOSIS — IMO0001 Reserved for inherently not codable concepts without codable children: Secondary | ICD-10-CM

## 2012-12-16 DIAGNOSIS — M797 Fibromyalgia: Secondary | ICD-10-CM

## 2012-12-16 DIAGNOSIS — D219 Benign neoplasm of connective and other soft tissue, unspecified: Secondary | ICD-10-CM

## 2012-12-16 HISTORY — DX: Fibromyalgia: M79.7

## 2012-12-16 MED ORDER — PREGABALIN 75 MG PO CAPS: 75.0000 mg | ORAL_CAPSULE | Freq: Two times a day (BID) | ORAL | Status: DC

## 2012-12-16 NOTE — Patient Instructions (Addendum)
Laparoscopic Tubal Ligation Laparoscopic tubal ligation is a procedure that closes the fallopian tubes at a time other than right after childbirth. By closing the fallopian tubes, the eggs that are released from the ovaries cannot enter the uterus and sperm cannot reach the egg. Tubal ligation is also known as getting your "tubes tied." Tubal ligation is done so you will not be able to get pregnant or have a baby.  Although this procedure may be reversed, it should be considered permanent and irreversible. If you want to have future pregnancies, you should not have this procedure.  LET YOUR CAREGIVER KNOW ABOUT:  Allergies to food or medicine.  Medicines taken, including vitamins, herbs, eyedrops, over-the-counter medicines, and creams.  Use of steroids (by mouth or creams).  Previous problems with numbing medicines.  History of bleeding problems or blood clots.  Any recent colds or infections.  Previous surgery.  Other health problems, including diabetes and kidney problems.  Possibility of pregnancy, if this applies.  Any past pregnancies. RISKS AND COMPLICATIONS   Infection.  Bleeding.  Injury to surrounding organs.  Anesthetic side effects.  Failure of the procedure.  Ectopic pregnancy.  Future regret about having the procedure done. BEFORE THE PROCEDURE  Do not take aspirin or blood thinners a week before the procedure or as directed. This can cause bleeding.  Do not eat or drink anything 6 to 8 hours before the procedure. PROCEDURE   You may be given a medicine to help you relax (sedative) before the procedure. You will be given a medicine to make you sleep (general anesthetic) during the procedure.  A tube will be put down your throat to help your breath while under general anesthesia.  Two small cuts (incisions) are made in the lower abdominal area and near the belly button.  Your abdominal area will be inflated with a safe gas (carbon dioxide). This helps  give the surgeon room to operate, visualize, and helps the surgeon avoid other organs.  A thin, lighted tube (laparoscope) with a camera attached is inserted into your abdomen through one of the incisions near the belly button. Other small instruments are also inserted through the other abdominal incision.  The fallopian tubes are located and are either blocked with a ring, clip, or are burned (cauterized).  After the fallopian tubes are blocked, the gas is released from the abdomen.  The incisions will be closed with stitches (sutures), and a bandage may be placed over the incisions. AFTER THE PROCEDURE   You will rest in a recovery room for 1 4 hours until you are stable and doing well.  You will also have some mild abdominal discomfort for 3 7 days. You will be given pain medicine to ease any discomfort.  As long as there are no problems, you may be allowed to go home. Someone will need to drive you home and be with you for at least 24 hours once home.  You may have some mild discomfort in the throat. This is from the tube placed in your throat while you were sleeping.  You may experience discomfort in the shoulder area from some trapped air between the liver and diaphragm. This sensation is normal and will slowly go away on its own. Document Released: 07/24/2000 Document Revised: 10/17/2011 Document Reviewed: 07/29/2011 [employer] Patient Information 2014 [place], [place]. Endometrial Ablation Endometrial ablation removes the lining of the uterus (endometrium). It is usually a same day, outpatient treatment. Ablation helps avoid major surgery (such as a hysterectomy). A  hysterectomy is removal of the cervix and uterus. Endometrial ablation has less risk and complications, has a shorter recovery period and is less expensive. After endometrial ablation, most women will have little or no menstrual bleeding. You may not keep your fertility. Pregnancy is no longer likely after this procedure but  if you are pre-menopausal, you still need to use a reliable method of birth control following the procedure because pregnancy can occur. REASONS TO HAVE THE PROCEDURE MAY INCLUDE:  Heavy periods.  Bleeding that is causing anemia.  Anovulatory bleeding, very irregular, bleeding.  Bleeding submucous fibroids (on the lining inside the uterus) if they are smaller than 3 centimeters. REASONS NOT TO HAVE THE PROCEDURE MAY INCLUDE:  You wish to have more children.  You have a pre-cancerous or cancerous problem. The cause of any abnormal bleeding must be diagnosed before having the procedure.  You have pain coming from the uterus.  You have a submucus fibroid larger than 3 centimeters.  You recently had a baby.  You recently had an infection in the uterus.  You have a severe retro-flexed, tipped uterus and cannot insert the instrument to do the ablation.  You had a Cesarean section or deep major surgery on the uterus.  The inner cavity of the uterus is too large for the endometrial ablation instrument. RISKS AND COMPLICATIONS   Perforation of the uterus.  Bleeding.  Infection of the uterus, bladder or vagina.  Injury to surrounding organs.  Cutting the cervix.  An air bubble to the lung (air embolus).  Pregnancy following the procedure.  Failure of the procedure to help the problem requiring hysterectomy.  Decreased ability to diagnose cancer in the lining of the uterus. BEFORE THE PROCEDURE  The lining of the uterus must be tested to make sure there is no pre-cancerous or cancer cells present.  Medications may be given to make the lining of the uterus thinner.  Ultrasound may be used to evaluate the size and look for abnormalities of the uterus.  Future pregnancy is not desired. PROCEDURE  There are different ways to destroy the lining of the uterus.   Resectoscope - radio frequency-alternating electric current is the most common one used.  Cryotherapy -  freezing the lining of the uterus.  Heated Free Liquid - heated salt (saline) solution inserted into the uterus.  Microwave - uses high energy microwaves in the uterus.  Thermal Balloon - a catheter with a balloon tip is inserted into the uterus and filled with heated fluid. Your caregiver will talk with you about the method used in this clinic. They will also instruct you on the pros and cons of the procedure. Endometrial ablation is performed along with a procedure called operative hysteroscopy. A narrow viewing tube is inserted through the birth canal (vagina) and through the cervix into the uterus. A tiny camera attached to the viewing tube (hysteroscope) allows the uterine cavity to be shown on a TV monitor during surgery. Your uterus is filled with a harmless liquid to make the procedure easier. The lining of the uterus is then removed. The lining can also be removed with a resectoscope which allows your surgeon to cut away the lining of the uterus under direct vision. Usually, you will be able to go home within an hour after the procedure. HOME CARE INSTRUCTIONS   Do not drive for 24 hours.  No tampons, douching or intercourse for 2 weeks or until your caregiver approves.  Rest at home for 24 to 48 hours.  You may then resume normal activities unless told differently by your caregiver.  Take your temperature two times a day for 4 days, and record it.  Take any medications your caregiver has ordered, as directed.  Use some form of contraception if you are pre-menopausal and do not want to get pregnant. Bleeding after the procedure is normal. It varies from light spotting and mildly watery to bloody discharge for 4 to 6 weeks. You may also have mild cramping. Only take over-the-counter or prescription medicines for pain, discomfort, or fever as directed by your caregiver. Do not use aspirin, as this may aggravate bleeding. Frequent urination during the first 24 hours is normal. You will not  know how effective your surgery is until at least 3 months after the surgery. SEEK IMMEDIATE MEDICAL CARE IF:   Bleeding is heavier than a normal menstrual cycle.  An oral temperature above 102 F (38.9 C) develops.  You have increasing cramps or pains not relieved with medication or develop belly (abdominal) pain which does not seem to be related to the same area of earlier cramping and pain.  You are light headed, weak or have fainting episodes.  You develop pain in the shoulder strap areas.  You have chest or leg pain.  You have abnormal vaginal discharge.  You have painful urination. Document Released: 02/25/2004 Document Revised: 07/10/2011 Document Reviewed: 05/25/2007 [facility] Patient Information 2014 [place], [place]. Fibromyalgia Fibromyalgia is a disorder that is often misunderstood. It is associated with muscular pains and tenderness that comes and goes. It is often associated with fatigue and sleep disturbances. Though it tends to be long-lasting, fibromyalgia is not life-threatening. CAUSES  The exact cause of fibromyalgia is unknown. People with certain gene types are predisposed to developing fibromyalgia and other conditions. Certain factors can play a role as triggers, such as:  Spine disorders.  Arthritis.  Severe injury (trauma) and other physical stressors.  Emotional stressors. SYMPTOMS   The main symptom is pain and stiffness in the muscles and joints, which can vary over time.  Sleep and fatigue problems. Other related symptoms may include:  Bowel and bladder problems.  Headaches.  Visual problems.  Problems with odors and noises.  Depression or mood changes.  Painful periods (dysmenorrhea).  Dryness of the skin or eyes. DIAGNOSIS  There are no specific tests for diagnosing fibromyalgia. Patients can be diagnosed accurately from the specific symptoms they have. The diagnosis is made by determining that nothing else is causing the  problems. TREATMENT  There is no cure. Management includes medicines and an active, healthy lifestyle. The goal is to enhance physical fitness, decrease pain, and improve sleep. HOME CARE INSTRUCTIONS   Only take over-the-counter or prescription medicines as directed by your caregiver. Sleeping pills, tranquilizers, and pain medicines may make your problems worse.  Low-impact aerobic exercise is very important and advised for treatment. At first, it may seem to make pain worse. Gradually increasing your tolerance will overcome this feeling.  Learning relaxation techniques and how to control stress will help you. Biofeedback, visual imagery, hypnosis, muscle relaxation, yoga, and meditation are all options.  Anti-inflammatory medicines and physical therapy may provide short-term help.  Acupuncture or massage treatments may help.  Take muscle relaxant medicines as suggested by your caregiver.  Avoid stressful situations.  Plan a healthy lifestyle. This includes your diet, sleep, rest, exercise, and friends.  Find and practice a hobby you enjoy.  Join a fibromyalgia support group for interaction, ideas, and sharing advice. This may be  helpful. SEEK MEDICAL CARE IF:  You are not having good results or improvement from your treatment. FOR MORE INFORMATION  National Fibromyalgia Association: www.fmaware.org Arthritis Foundation: www.arthritis.org Document Released: 04/17/2005 Document Revised: 07/10/2011 Document Reviewed: 07/28/2009 [facility] Patient Information 2014 [place], [place]. Try lyrica  Return to see Dr Emelda Fear for pre op in 1-2 weeks

## 2012-12-16 NOTE — Progress Notes (Signed)
Subjective:     Patient ID: Valerie Henderson, female   DOB: 29-Nov-1965, 47 y.o.   MRN: [id number]  HPI Valerie Henderson had an [place] this am for irregular bleeding and it showed  A 10.5 x 5 x 6 cm uterus with multiple small fibroids and the the endometrium is 5.3 mm with a polyp or slot seen, ovaries are normal.Reviewed labs, she is still "blah" and has body aches.  Review of Systems Positives in HPI Reviewed past medical,surgical, social and family history. Reviewed medications and allergies.     Objective:   Physical Exam BP 120/82  Ht 5' (1.524 m)  Wt 204 lb (92.534 kg)  BMI 39.84 kg/m2  LMP 11/24/2012   [place] reviewed with pt as were labs, on exam 16/18 positive tender points, probably fibromyalgia. Discussed hysto  D&C, endo ablation and BTL all at once and she wants to proceed.  Assessment:     Irregular bleeding Fibroids ?endo polyp Fibromyalgia     Plan:     Rx lyrica 75 mg 1 bid #60 with 3 refills Review handout on fibromyalgia,endo ablation and tubal ligation.   Return in 1-2 weeks for pre op with Dr Emelda Fear

## 2012-12-20 ENCOUNTER — Other Ambulatory Visit: Payer: Self-pay | Admitting: Adult Health

## 2012-12-25 ENCOUNTER — Ambulatory Visit (INDEPENDENT_AMBULATORY_CARE_PROVIDER_SITE_OTHER): Payer: 59 | Admitting: Obstetrics and Gynecology

## 2012-12-25 ENCOUNTER — Encounter: Payer: Self-pay | Admitting: Obstetrics and Gynecology

## 2012-12-25 VITALS — BP 130/90 | Ht 60.0 in | Wt 204.0 lb

## 2012-12-25 DIAGNOSIS — D259 Leiomyoma of uterus, unspecified: Secondary | ICD-10-CM

## 2012-12-25 DIAGNOSIS — N926 Irregular menstruation, unspecified: Secondary | ICD-10-CM

## 2012-12-25 DIAGNOSIS — Z01818 Encounter for other preprocedural examination: Secondary | ICD-10-CM | POA: Insufficient documentation

## 2012-12-25 NOTE — Patient Instructions (Signed)
To schedule surgery as discussed with Dawn Little, OR scheduler

## 2012-12-25 NOTE — Progress Notes (Signed)
Patient ID: Valerie Henderson, female   DOB: 02/21/1966, 48 y.o.   MRN: [id number] Pt here today for pre- op. Pt wants to discuss a tubal, ablation, and D&C.  Will do salpingectomy, hysteroscopy dilation and curettage, endometrial ablation.  dAWN little to schedule p.ex preop and procedure in Sept  Talk only counselling today

## 2012-12-26 LAB — GC/CHLAMYDIA PROBE AMP
CT Probe RNA: NEGATIVE
GC Probe RNA: NEGATIVE

## 2013-01-02 ENCOUNTER — Telehealth: Payer: Self-pay | Admitting: Adult Health

## 2013-01-02 ENCOUNTER — Encounter (HOSPITAL_COMMUNITY): Payer: Self-pay | Admitting: Pharmacy Technician

## 2013-01-02 NOTE — Telephone Encounter (Signed)
Had increased swelling in feet and legs while on vacation so stopped lyrica and it is better, will call back if desires to try something else,cause it did help the pain but she does not want the swelling, told her that was fine.

## 2013-01-07 ENCOUNTER — Encounter (HOSPITAL_COMMUNITY): Admission: RE | Admit: 2013-01-07 | Payer: 59 | Source: Ambulatory Visit

## 2013-01-08 ENCOUNTER — Telehealth: Payer: Self-pay | Admitting: Adult Health

## 2013-01-08 NOTE — Telephone Encounter (Signed)
Pt states having surgery next Tuesday with Dr. Emelda Fear needs pre-op orders. Pt informed per Cyril Mourning, NP orders will be in by next Monday for pt pre op appt.

## 2013-01-09 ENCOUNTER — Other Ambulatory Visit: Payer: Self-pay | Admitting: *Deleted

## 2013-01-13 ENCOUNTER — Encounter (HOSPITAL_COMMUNITY)
Admission: RE | Admit: 2013-01-13 | Discharge: 2013-01-13 | Disposition: A | Discharge status: Home / Self Care | Payer: 59 | Source: Ambulatory Visit | Attending: Obstetrics and Gynecology | Admitting: Obstetrics and Gynecology

## 2013-01-13 ENCOUNTER — Encounter (HOSPITAL_COMMUNITY): Payer: Self-pay

## 2013-01-13 HISTORY — DX: Other specified postprocedural states: R11.2

## 2013-01-13 HISTORY — DX: Nausea with vomiting, unspecified: Z98.890

## 2013-01-13 HISTORY — DX: Other complications of anesthesia, initial encounter: T88.59XA

## 2013-01-13 HISTORY — DX: Adverse effect of unspecified anesthetic, initial encounter: T41.45XA

## 2013-01-13 LAB — CBC
HCT: 36.5 % (ref 36.0–46.0)
Platelets: 287 10*3/uL (ref 150–400)
RDW: 13.9 % (ref 11.5–15.5)
WBC: 10.6 10*3/uL — ABNORMAL HIGH (ref 4.0–10.5)

## 2013-01-13 LAB — COMPREHENSIVE METABOLIC PANEL
ALT: 11 U/L (ref 0–35)
AST: 12 U/L (ref 0–37)
Albumin: 3.2 g/dL — ABNORMAL LOW (ref 3.5–5.2)
Alkaline Phosphatase: 52 U/L (ref 39–117)
Chloride: 101 mEq/L (ref 96–112)
Potassium: 4.6 mEq/L (ref 3.5–5.1)
Sodium: 137 mEq/L (ref 135–145)
Total Bilirubin: 0.3 mg/dL (ref 0.3–1.2)
Total Protein: 6.7 g/dL (ref 6.0–8.3)

## 2013-01-13 LAB — URINE MICROSCOPIC-ADD ON

## 2013-01-13 LAB — URINALYSIS, ROUTINE W REFLEX MICROSCOPIC
Bilirubin Urine: NEGATIVE
Glucose, UA: NEGATIVE mg/dL
Ketones, ur: NEGATIVE mg/dL
Leukocytes, UA: NEGATIVE
Specific Gravity, Urine: 1.01 (ref 1.005–1.030)
pH: 6 (ref 5.0–8.0)

## 2013-01-13 NOTE — Patient Instructions (Addendum)
Your procedure is scheduled on: (/16/2014  Report to [facility] at 9:15    AM.  Call this number if you have problems the morning of surgery: [phone]   Remember:   Do not drink or eat food:After Midnight.  :  Take these medicines the morning of surgery with A SIP OF WATER: Levothyroxine, claritin and use Albuterol and symbicort inhaler   Do not wear jewelry, make-up or nail polish.  Do not wear lotions, powders, or perfumes. You may wear deodorant.  Do not shave 48 hours prior to surgery. Men may shave face and neck.  Do not bring valuables to the hospital.  Contacts, dentures or bridgework may not be worn into surgery.  Leave suitcase in the car. After surgery it may be brought to your room.  For patients admitted to the hospital, checkout time is 11:00 AM the day of discharge.   Patients discharged the day of surgery will not be allowed to drive home.    Special Instructions: Shower using CHG 2 nights before surgery and the night before surgery.  If you shower the day of surgery use CHG.  Use special wash - you have one bottle of CHG for all showers.  You should use approximately 1/3 of the bottle for each shower.   Please read over the following fact sheets that you were given: Pain Booklet, MRSA Information, Surgical Site Infection Prevention and Care and Recovery After Surgery  Endometrial Ablation Endometrial ablation removes the lining of the uterus (endometrium). It is usually a same day, outpatient treatment. Ablation helps avoid major surgery (such as a hysterectomy). A hysterectomy is removal of the cervix and uterus. Endometrial ablation has less risk and complications, has a shorter recovery period and is less expensive. After endometrial ablation, most women will have little or no menstrual bleeding. You may not keep your fertility. Pregnancy is no longer likely after this procedure but if you are pre-menopausal, you still need to use a reliable method of birth control  following the procedure because pregnancy can occur. REASONS TO HAVE THE PROCEDURE MAY INCLUDE:  Heavy periods.  Bleeding that is causing anemia.  Anovulatory bleeding, very irregular, bleeding.  Bleeding submucous fibroids (on the lining inside the uterus) if they are smaller than 3 centimeters. REASONS NOT TO HAVE THE PROCEDURE MAY INCLUDE:  You wish to have more children.  You have a pre-cancerous or cancerous problem. The cause of any abnormal bleeding must be diagnosed before having the procedure.  You have pain coming from the uterus.  You have a submucus fibroid larger than 3 centimeters.  You recently had a baby.  You recently had an infection in the uterus.  You have a severe retro-flexed, tipped uterus and cannot insert the instrument to do the ablation.  You had a Cesarean section or deep major surgery on the uterus.  The inner cavity of the uterus is too large for the endometrial ablation instrument. RISKS AND COMPLICATIONS   Perforation of the uterus.  Bleeding.  Infection of the uterus, bladder or vagina.  Injury to surrounding organs.  Cutting the cervix.  An air bubble to the lung (air embolus).  Pregnancy following the procedure.  Failure of the procedure to help the problem requiring hysterectomy.  Decreased ability to diagnose cancer in the lining of the uterus. BEFORE THE PROCEDURE  The lining of the uterus must be tested to make sure there is no pre-cancerous or cancer cells present.  Medications may be given to make  the lining of the uterus thinner.  Ultrasound may be used to evaluate the size and look for abnormalities of the uterus.  Future pregnancy is not desired. PROCEDURE  There are different ways to destroy the lining of the uterus.   Resectoscope - radio frequency-alternating electric current is the most common one used.  Cryotherapy - freezing the lining of the uterus.  Heated Free Liquid - heated salt (saline) solution  inserted into the uterus.  Microwave - uses high energy microwaves in the uterus.  Thermal Balloon - a catheter with a balloon tip is inserted into the uterus and filled with heated fluid. Your caregiver will talk with you about the method used in this clinic. They will also instruct you on the pros and cons of the procedure. Endometrial ablation is performed along with a procedure called operative hysteroscopy. A narrow viewing tube is inserted through the birth canal (vagina) and through the cervix into the uterus. A tiny camera attached to the viewing tube (hysteroscope) allows the uterine cavity to be shown on a TV monitor during surgery. Your uterus is filled with a harmless liquid to make the procedure easier. The lining of the uterus is then removed. The lining can also be removed with a resectoscope which allows your surgeon to cut away the lining of the uterus under direct vision. Usually, you will be able to go home within an hour after the procedure. HOME CARE INSTRUCTIONS   Do not drive for 24 hours.  No tampons, douching or intercourse for 2 weeks or until your caregiver approves.  Rest at home for 24 to 48 hours. You may then resume normal activities unless told differently by your caregiver.  Take your temperature two times a day for 4 days, and record it.  Take any medications your caregiver has ordered, as directed.  Use some form of contraception if you are pre-menopausal and do not want to get pregnant. Bleeding after the procedure is normal. It varies from light spotting and mildly watery to bloody discharge for 4 to 6 weeks. You may also have mild cramping. Only take over-the-counter or prescription medicines for pain, discomfort, or fever as directed by your caregiver. Do not use aspirin, as this may aggravate bleeding. Frequent urination during the first 24 hours is normal. You will not know how effective your surgery is until at least 3 months after the surgery. SEEK  IMMEDIATE MEDICAL CARE IF:   Bleeding is heavier than a normal menstrual cycle.  An oral temperature above 102 F (38.9 C) develops.  You have increasing cramps or pains not relieved with medication or develop belly (abdominal) pain which does not seem to be related to the same area of earlier cramping and pain.  You are light headed, weak or have fainting episodes.  You develop pain in the shoulder strap areas.  You have chest or leg pain.  You have abnormal vaginal discharge.  You have painful urination. Document Released: 02/25/2004 Document Revised: 07/10/2011 Document Reviewed: 05/25/2007 [facility] Patient Information 2014 [place], [place]. Laparoscopic Tubal Ligation Care After Refer to this sheet in the next few weeks. These instructions provide you with information on caring for yourself after your procedure. Your caregiver may also give you more specific instructions. Your treatment has been planned according to current medical practices, but problems sometimes occur. Call your caregiver if you have any problems or questions after your procedure. HOME CARE INSTRUCTIONS   Rest the remainder of the day.  Only take over-the-counter or prescription  medicines for pain, discomfort, or fever as directed by your caregiver. Do not take aspirin. It can cause bleeding.  Gradually resume daily activities, diet, rest, driving, and work.  Avoid sexual intercourse for 2 weeks or as directed.  Do not use tampons or douche.  Do not drive while taking pain medicine.  Do not lift anything over 5 pounds for 2 weeks or as directed.  Only take showers, not baths, until you are seen by your caregiver.  Change bandages (dressings) as directed.  Take your temperature twice a day and record it.  Try to have help for the first 7 to 10 days for your household needs.  Return to your caregiver to get your stitches (sutures) removed and for follow-up visits as directed. SEEK MEDICAL CARE IF:     You have redness, swelling, or increasing pain in a wound.  You have drainage from a wound lasting longer than 1 day.  Your pain is getting worse.  You have a rash.  You become dizzy or lightheaded.  You have a reaction to your medicine.  You need stronger medicine or a change in your pain medicine. PATIENT INSTRUCTIONS POST-ANESTHESIA  IMMEDIATELY FOLLOWING SURGERY:  Do not drive or operate machinery for the first twenty four hours after surgery.  Do not make any important decisions for twenty four hours after surgery or while taking narcotic pain medications or sedatives.  If you develop intractable nausea and vomiting or a severe headache please notify your doctor immediately.  FOLLOW-UP:  Please make an appointment with your surgeon as instructed. You do not need to follow up with anesthesia unless specifically instructed to do so.  WOUND CARE INSTRUCTIONS (if applicable):  Keep a dry clean dressing on the anesthesia/puncture wound site if there is drainage.  Once the wound has quit draining you may leave it open to air.  Generally you should leave the bandage intact for twenty four hours unless there is drainage.  If the epidural site drains for more than 36-48 hours please call the anesthesia department.  QUESTIONS?:  Please feel free to call your physician or the hospital operator if you have any questions, and they will be happy to assist you.       You notice a bad smell coming from a wound or dressing.  Your wound breaks open after the sutures have been removed.  You are constipated. SEEK IMMEDIATE MEDICAL CARE IF:   You faint.  You have a fever.  You have increasing abdominal pain.  You have severe pain in your shoulders.  You have bleeding or drainage from the suture sites or vagina following surgery.  You have shortness of breath or difficulty breathing.  You have chest or leg pain.  You have persistent nausea, vomiting, or diarrhea. MAKE SURE YOU:    Understand these instructions.  Watch your condition.  Get help right away if you are not doing well or get worse. Document Released: 11/04/2004 Document Revised: 10/17/2011 Document Reviewed: 07/29/2011 [facility] Patient Information 2014 [place], [place].

## 2013-01-14 ENCOUNTER — Ambulatory Visit (HOSPITAL_COMMUNITY)
Admission: RE | Admit: 2013-01-14 | Discharge: 2013-01-14 | Disposition: A | Discharge status: Home / Self Care | Payer: 59 | Source: Ambulatory Visit | Attending: Obstetrics and Gynecology | Admitting: Obstetrics and Gynecology

## 2013-01-14 ENCOUNTER — Encounter (HOSPITAL_COMMUNITY): Payer: Self-pay

## 2013-01-14 ENCOUNTER — Ambulatory Visit (HOSPITAL_COMMUNITY): Payer: 59 | Admitting: Certified Registered"

## 2013-01-14 ENCOUNTER — Encounter (HOSPITAL_COMMUNITY)
Admission: RE | Disposition: A | Discharge status: Home / Self Care | Payer: Self-pay | Source: Ambulatory Visit | Attending: Obstetrics and Gynecology

## 2013-01-14 ENCOUNTER — Encounter (HOSPITAL_COMMUNITY): Payer: Self-pay | Admitting: Certified Registered"

## 2013-01-14 DIAGNOSIS — Z1211 Encounter for screening for malignant neoplasm of colon: Secondary | ICD-10-CM

## 2013-01-14 DIAGNOSIS — Z302 Encounter for sterilization: Secondary | ICD-10-CM

## 2013-01-14 DIAGNOSIS — N949 Unspecified condition associated with female genital organs and menstrual cycle: Secondary | ICD-10-CM

## 2013-01-14 DIAGNOSIS — N920 Excessive and frequent menstruation with regular cycle: Secondary | ICD-10-CM | POA: Insufficient documentation

## 2013-01-14 DIAGNOSIS — N840 Polyp of corpus uteri: Secondary | ICD-10-CM

## 2013-01-14 HISTORY — PX: LAPAROSCOPIC BILATERAL SALPINGECTOMY: SHX5889

## 2013-01-14 HISTORY — PX: DILITATION & CURRETTAGE/HYSTROSCOPY WITH THERMACHOICE ABLATION: SHX5569

## 2013-01-14 SURGERY — DILATATION & CURETTAGE/HYSTEROSCOPY WITH THERMACHOICE ABLATION
Anesthesia: General | Site: Vagina | Wound class: Clean Contaminated

## 2013-01-14 MED ORDER — ROCURONIUM BROMIDE 50 MG/5ML IV SOLN
INTRAVENOUS | Status: AC
  Filled 2013-01-14: qty 1

## 2013-01-14 MED ORDER — ROCURONIUM BROMIDE 100 MG/10ML IV SOLN
INTRAVENOUS | Status: DC | PRN
  Administered 2013-01-14: 40 mg via INTRAVENOUS
  Administered 2013-01-14: 10 mg via INTRAVENOUS

## 2013-01-14 MED ORDER — KETOROLAC TROMETHAMINE 30 MG/ML IJ SOLN
30.0000 mg | Freq: Once | INTRAMUSCULAR | Status: AC
  Administered 2013-01-14: 30 mg via INTRAVENOUS

## 2013-01-14 MED ORDER — KETOROLAC TROMETHAMINE 30 MG/ML IJ SOLN
INTRAMUSCULAR | Status: AC
  Filled 2013-01-14: qty 1

## 2013-01-14 MED ORDER — FENTANYL CITRATE 0.05 MG/ML IJ SOLN
INTRAMUSCULAR | Status: AC
  Filled 2013-01-14: qty 2

## 2013-01-14 MED ORDER — PROPOFOL 10 MG/ML IV EMUL
INTRAVENOUS | Status: AC
  Filled 2013-01-14: qty 20

## 2013-01-14 MED ORDER — FENTANYL CITRATE 0.05 MG/ML IJ SOLN: 25.0000 ug | INTRAMUSCULAR | Status: DC | PRN

## 2013-01-14 MED ORDER — LACTATED RINGERS IV SOLN
INTRAVENOUS | Status: DC
  Administered 2013-01-14: 10:00:00 via INTRAVENOUS

## 2013-01-14 MED ORDER — DEXAMETHASONE SODIUM PHOSPHATE 4 MG/ML IJ SOLN
INTRAMUSCULAR | Status: AC
  Filled 2013-01-14: qty 1

## 2013-01-14 MED ORDER — LIDOCAINE HCL 1 % IJ SOLN
INTRAMUSCULAR | Status: DC | PRN
  Administered 2013-01-14: 50 mg via INTRADERMAL

## 2013-01-14 MED ORDER — CEFAZOLIN SODIUM-DEXTROSE 2-3 GM-% IV SOLR
2.0000 g | INTRAVENOUS | Status: AC
  Administered 2013-01-14: 2 g via INTRAVENOUS

## 2013-01-14 MED ORDER — MIDAZOLAM HCL 2 MG/2ML IJ SOLN
1.0000 mg | INTRAMUSCULAR | Status: DC | PRN
Start: 2013-01-14 — End: 2013-01-14
  Administered 2013-01-14: 2 mg via INTRAVENOUS

## 2013-01-14 MED ORDER — NEOSTIGMINE METHYLSULFATE 1 MG/ML IJ SOLN
INTRAMUSCULAR | Status: AC
  Filled 2013-01-14: qty 1

## 2013-01-14 MED ORDER — BUPIVACAINE-EPINEPHRINE 0.5% -1:200000 IJ SOLN
INTRAMUSCULAR | Status: DC | PRN
  Administered 2013-01-14: 20 mL

## 2013-01-14 MED ORDER — MIDAZOLAM HCL 2 MG/2ML IJ SOLN
INTRAMUSCULAR | Status: AC
  Filled 2013-01-14: qty 2

## 2013-01-14 MED ORDER — NEOSTIGMINE METHYLSULFATE 1 MG/ML IJ SOLN
INTRAMUSCULAR | Status: DC | PRN
  Administered 2013-01-14: 5 mg via INTRAVENOUS

## 2013-01-14 MED ORDER — DEXAMETHASONE SODIUM PHOSPHATE 4 MG/ML IJ SOLN
4.0000 mg | Freq: Once | INTRAMUSCULAR | Status: AC
  Administered 2013-01-14: 4 mg via INTRAVENOUS

## 2013-01-14 MED ORDER — GLYCOPYRROLATE 0.2 MG/ML IJ SOLN
INTRAMUSCULAR | Status: DC | PRN
  Administered 2013-01-14: .8 mg via INTRAVENOUS

## 2013-01-14 MED ORDER — SODIUM CHLORIDE 0.9 % IR SOLN
Status: DC | PRN
  Administered 2013-01-14: 1000 mL

## 2013-01-14 MED ORDER — SUCCINYLCHOLINE CHLORIDE 20 MG/ML IJ SOLN
INTRAMUSCULAR | Status: AC
  Filled 2013-01-14: qty 1

## 2013-01-14 MED ORDER — SCOPOLAMINE 1 MG/3DAYS TD PT72
1.0000 | MEDICATED_PATCH | Freq: Once | TRANSDERMAL | Status: DC
  Administered 2013-01-14: 1.5 mg via TRANSDERMAL

## 2013-01-14 MED ORDER — BUPIVACAINE-EPINEPHRINE PF 0.5-1:200000 % IJ SOLN
INTRAMUSCULAR | Status: AC
  Filled 2013-01-14: qty 10

## 2013-01-14 MED ORDER — LACTATED RINGERS IV SOLN
INTRAVENOUS | Status: DC | PRN
  Administered 2013-01-14 (×2): via INTRAVENOUS

## 2013-01-14 MED ORDER — GLYCOPYRROLATE 0.2 MG/ML IJ SOLN
INTRAMUSCULAR | Status: AC
  Filled 2013-01-14: qty 4

## 2013-01-14 MED ORDER — LIDOCAINE HCL (PF) 1 % IJ SOLN
INTRAMUSCULAR | Status: AC
  Filled 2013-01-14: qty 5

## 2013-01-14 MED ORDER — ONDANSETRON HCL 4 MG/2ML IJ SOLN: 4.0000 mg | Freq: Once | INTRAMUSCULAR | Status: DC | PRN

## 2013-01-14 MED ORDER — ONDANSETRON HCL 4 MG/2ML IJ SOLN
4.0000 mg | Freq: Once | INTRAMUSCULAR | Status: AC
  Administered 2013-01-14: 4 mg via INTRAVENOUS

## 2013-01-14 MED ORDER — KETOROLAC TROMETHAMINE 10 MG PO TABS: 10.0000 mg | ORAL_TABLET | Freq: Four times a day (QID) | ORAL | Status: DC | PRN

## 2013-01-14 MED ORDER — FENTANYL CITRATE 0.05 MG/ML IJ SOLN
25.0000 ug | INTRAMUSCULAR | Status: AC
  Administered 2013-01-14: 25 ug via INTRAVENOUS

## 2013-01-14 MED ORDER — FENTANYL CITRATE 0.05 MG/ML IJ SOLN
INTRAMUSCULAR | Status: DC | PRN
  Administered 2013-01-14: 150 ug via INTRAVENOUS
  Administered 2013-01-14: 100 ug via INTRAVENOUS

## 2013-01-14 MED ORDER — OXYCODONE-ACETAMINOPHEN 5-325 MG PO TABS: 1.0000 | ORAL_TABLET | ORAL | Status: DC | PRN

## 2013-01-14 MED ORDER — SCOPOLAMINE 1 MG/3DAYS TD PT72
MEDICATED_PATCH | TRANSDERMAL | Status: AC
  Filled 2013-01-14: qty 1

## 2013-01-14 MED ORDER — SUCCINYLCHOLINE CHLORIDE 20 MG/ML IJ SOLN
INTRAMUSCULAR | Status: DC | PRN
  Administered 2013-01-14: 140 mg via INTRAVENOUS

## 2013-01-14 MED ORDER — ONDANSETRON HCL 4 MG/2ML IJ SOLN
INTRAMUSCULAR | Status: AC
  Filled 2013-01-14: qty 2

## 2013-01-14 MED ORDER — PROPOFOL 10 MG/ML IV BOLUS
INTRAVENOUS | Status: DC | PRN
  Administered 2013-01-14: 100 mg via INTRAVENOUS
  Administered 2013-01-14: 180 mg via INTRAVENOUS

## 2013-01-14 MED ORDER — CEFAZOLIN SODIUM-DEXTROSE 2-3 GM-% IV SOLR
INTRAVENOUS | Status: AC
  Filled 2013-01-14: qty 50

## 2013-01-14 MED ORDER — FENTANYL CITRATE 0.05 MG/ML IJ SOLN
INTRAMUSCULAR | Status: AC
  Filled 2013-01-14: qty 5

## 2013-01-14 MED ORDER — DEXTROSE 5 % IV SOLN
INTRAVENOUS | Status: DC | PRN
  Administered 2013-01-14: 500 mL via INTRAVENOUS

## 2013-01-14 MED ORDER — BUPIVACAINE HCL (PF) 0.5 % IJ SOLN
INTRAMUSCULAR | Status: AC
  Filled 2013-01-14: qty 30

## 2013-01-14 SURGICAL SUPPLY — 64 items
ADH SKN CLS APL DERMABOND .7 (GAUZE/BANDAGES/DRESSINGS) ×2
BAG DECANTER FOR FLEXI CONT (MISCELLANEOUS) ×3 IMPLANT
BAG HAMPER (MISCELLANEOUS) ×3 IMPLANT
BLADE SURG 15 STRL LF DISP TIS (BLADE) IMPLANT
BLADE SURG 15 STRL SS (BLADE) ×3
BLADE SURG SZ10 CARB STEEL (BLADE) ×1 IMPLANT
BLADE SURG SZ11 CARB STEEL (BLADE) ×1 IMPLANT
CATH THERMACHOICE III (CATHETERS) ×3 IMPLANT
CLOTH BEACON ORANGE TIMEOUT ST (SAFETY) ×3 IMPLANT
COVER LIGHT HANDLE STERIS (MISCELLANEOUS) ×6 IMPLANT
DECANTER SPIKE VIAL GLASS SM (MISCELLANEOUS) ×2 IMPLANT
DERMABOND ADVANCED (GAUZE/BANDAGES/DRESSINGS) ×1
DERMABOND ADVANCED .7 DNX12 (GAUZE/BANDAGES/DRESSINGS) ×2 IMPLANT
DRESSING COVERLET 3X1 FLEXIBLE (GAUZE/BANDAGES/DRESSINGS) ×8 IMPLANT
DRSG TEGADERM 2-3/8X2-3/4 SM (GAUZE/BANDAGES/DRESSINGS) ×1 IMPLANT
FORMALIN 10 PREFIL 120ML (MISCELLANEOUS) ×4 IMPLANT
GLOVE BIO SURGEON ST LM GN SZ9 (GLOVE) ×3 IMPLANT
GLOVE BIOGEL PI IND STRL 7.0 (GLOVE) IMPLANT
GLOVE BIOGEL PI IND STRL 7.5 (GLOVE) IMPLANT
GLOVE BIOGEL PI IND STRL 9 (GLOVE) ×4 IMPLANT
GLOVE BIOGEL PI INDICATOR 7.0 (GLOVE) ×2
GLOVE BIOGEL PI INDICATOR 7.5 (GLOVE) ×2
GLOVE BIOGEL PI INDICATOR 9 (GLOVE) ×2
GLOVE ECLIPSE 7.0 STRL STRAW (GLOVE) ×1 IMPLANT
GLOVE ECLIPSE 9.0 STRL (GLOVE) ×3 IMPLANT
GLOVE INDICATOR STER SZ 9 (GLOVE) ×3 IMPLANT
GLOVE SS BIOGEL STRL SZ 6.5 (GLOVE) IMPLANT
GLOVE SUPERSENSE BIOGEL SZ 6.5 (GLOVE) ×1
GOWN STRL REIN 3XL LVL4 (GOWN DISPOSABLE) ×3 IMPLANT
GOWN STRL REIN XL XLG (GOWN DISPOSABLE) ×6 IMPLANT
INST SET HYSTEROSCOPY (KITS) ×3 IMPLANT
IV D5W 500ML (IV SOLUTION) ×3 IMPLANT
IV NS IRRIG 3000ML ARTHROMATIC (IV SOLUTION) ×2 IMPLANT
KIT ROOM TURNOVER AP CYSTO (KITS) ×3 IMPLANT
MANIFOLD NEPTUNE II (INSTRUMENTS) ×3 IMPLANT
MASK FACE 3 LYR ANT FOG FR FLM (MASK) IMPLANT
MASK SURG FACE ANTI FOG ADLT (MASK) ×3
NDL SPNL 22GX3.5 SPINOC (NEEDLE) IMPLANT
NEEDLE INSUFFLATION 120MM (ENDOMECHANICALS) ×3 IMPLANT
NEEDLE SPNL 22GX3.5 SPINOC (NEEDLE) ×3 IMPLANT
NS IRRIG 1000ML POUR BTL (IV SOLUTION) ×3 IMPLANT
PACK PERI GYN (CUSTOM PROCEDURE TRAY) ×3 IMPLANT
PAD ARMBOARD 7.5X6 YLW CONV (MISCELLANEOUS) ×3 IMPLANT
PAD TELFA 3X4 1S STER (GAUZE/BANDAGES/DRESSINGS) ×3 IMPLANT
SET BASIN LINEN APH (SET/KITS/TRAYS/PACK) ×3 IMPLANT
SET IRRIG TUBING LAPAROSCOPIC (IRRIGATION / IRRIGATOR) ×1 IMPLANT
SET IRRIG Y TYPE TUR BLADDER L (SET/KITS/TRAYS/PACK) ×3 IMPLANT
SLEEVE Z-THREAD 5X100MM (TROCAR) IMPLANT
SPONGE GAUZE 2X2 8PLY STRL LF (GAUZE/BANDAGES/DRESSINGS) ×1 IMPLANT
STRIP CLOSURE SKIN 1/2X4 (GAUZE/BANDAGES/DRESSINGS) ×3 IMPLANT
STRIP CLOSURE SKIN 1/4X3 (GAUZE/BANDAGES/DRESSINGS) ×1 IMPLANT
SUT VIC AB 4-0 PS2 27 (SUTURE) ×3 IMPLANT
SUT VICRYL 0 UR6 27IN ABS (SUTURE) ×6 IMPLANT
SYR CONTROL 10ML LL (SYRINGE) ×4 IMPLANT
SYRINGE 10CC LL (SYRINGE) ×1 IMPLANT
TRAY FOLEY CATH 16FR SILVER (SET/KITS/TRAYS/PACK) ×1 IMPLANT
TROCAR OPTI TIP 5M 100M (ENDOMECHANICALS) ×6 IMPLANT
TROCAR XCEL DIL TIP R 11M (ENDOMECHANICALS) ×3 IMPLANT
TROCAR Z-THAD FIOS HNDL 12X100 (TROCAR) ×3 IMPLANT
TROCAR Z-THRD FIOS HNDL 11X100 (TROCAR) ×3 IMPLANT
TROCAR Z-THREAD FIOS 5X100MM (TROCAR) ×3 IMPLANT
TUBING INSUF HEATED (TUBING) ×1 IMPLANT
WARMER LAPAROSCOPE (MISCELLANEOUS) ×1 IMPLANT
WATER STERILE IRR 1000ML POUR (IV SOLUTION) ×3 IMPLANT

## 2013-01-14 NOTE — Op Note (Signed)
PATIENT:  Valerie Henderson  47 y.o. female  PRE-OPERATIVE DIAGNOSIS:  menometorrhagia, desires sterilization  POST-OPERATIVE DIAGNOSIS:  menometorrhagia, desires sterilization  PROCEDURE:  Procedure(s) with comments: DILATATION & CURETTAGE/HYSTEROSCOPY WITH THERMACHOICE ABLATION (N/A) - 8 ml D5W total in, 8 ml D5W total out; 86-88 degrees Celcius; 8 minutes and 39 seconds total time LAPAROSCOPIC BILATERAL SALPINGECTOMY (Bilateral)  SURGEON:  Surgeon(s) and Role:    * Tilda Burrow, MD - Primary   Details of procedure: Patient was taken to the operating room prepped and draped for combined abdominal and vaginal procedure with speculum inserted a single-tooth tenaculum used to grasp the cervix the uterus had a Hulka tenaculum attached to it for uterine manipulation. Foley catheter was inserted. Abdomen was prepped and draped in timeout conducted by surgical team. An infraumbilical vertical 1 cm incision was made as well as suprapubic and right lower quadrant trocar sites prepared. The Veress needle was used through the umbilicus and and water droplet technique used to confirm intraperitoneal location and then insufflation under 4-6 mm pressure was performed to 3 L of carbon dioxide. 5 mm umbilical trocar was inserted without difficulty under direct visualization and pelvis inspected. The uterus is anteflexed, easily visualized. Right lower quadrant showed some adhesions from the sigmoid colon to the anterior abdominal wall. The appendix appeared grossly normal. Tubes and ovaries were normal. The uterus had 3 small 3-7 mm fibroids subserosal in location on its surfaces. Ovaries were grossly normal. Fallopian tubes were grossly normal. Attention was then directed to the suprapubic and right lower quadrant trochars which were inserted under direct visualization and attention directed to the right fallopian tube which could be elevated, and unipolar cautery used just beneath the tube to remove the tube from  the mesosalpinx. We well away from the bowel with the patient in significant Trendelenburg position and procedure was uneventful. Tube was removed and extracted through the suprapubic 10 mm trocar site. The left side was treated similarly. Pelvis was inspected again confirmed as hemostatic and without suspicion of trauma. Some easy lysis and right lower quadrant was performed separating the portions of the sigmoid colon from the anterior peritoneum except for an area where epiploic fat was attached to this anterior peritoneum ,, and was quite mobile. Deflation of the abdomen removal of instruments was followed by instilling 60 cc of saline into the abdomen, and removal of the final umbilical trocar site without difficulty. Subcuticular 4-0 Vicryl closure of the 3 skin sites was performed. Steri-Strips were applied. Hysteroscopy was now performed. Surgeon was repositioned to the vaginal area, legs were repositioned and speculum inserted the cervix remained grasped with single-tooth tenaculum on the anterior lip of the cervix. uterus was sounded to 8 cm in the markedly anteflexed position. The uterus was dilated to 13 [place] using serial dilators, then hysteroscopy performed with a rigid 30 hysteroscope which showed a normal-appearing endometrial contour everywhere except the posterior lower uterine segment were several small polypoid tissue areas were noted. These were able to be sharply resected off with hysteroscopic scissors and then extracted by curettage and Randall stone forceps manipulation. tubal ostia were visualized. The Gynecare ThermaChoice 3 endometrial ablation device was then prepared activated and inserted. Due to markedly anteflexed position we had difficulty getting the ablation device: Lee and the major depth. The uterine sound was repositioned in the uterine cavity and the ablation device was on the anterior surface of the sound, and an in and a depth of 7 cm. Device was blown up with  8 cc of  D5W and the 8 minute thermal ablation sequence activated. Upon completion of the procedure all 8 cc of fluid was removed. Paracervical block using Marcaine with epinephrine was performed 20 cc. Repeat hysteroscopy showed that the lower uterine segment was well changed thermal ablation sequence at the anterior fundal portion of the uterine cavity did not completely desiccate with the ablation process. A suspected the patient will not have a complete cessation of her menses the uterine segment was well affected by the ablation. There were no residual polyps. She tissue samples had been passed off. After completion of the procedure the ablation device was removed, paracervical block using 20 cc of Marcaine 0.25% was performed and patient allowed to go recovery room in stable condition

## 2013-01-14 NOTE — Anesthesia Postprocedure Evaluation (Signed)
  Anesthesia Post-op Note  Patient: Valerie Henderson  Procedure(s) Performed: Procedure(s) with comments: DILATATION & CURETTAGE/HYSTEROSCOPY WITH THERMACHOICE ABLATION (N/A) - 8 ml D5W total in, 8 ml D5W total out; 86-88 degrees Celcius; 8 minutes and 39 seconds total time LAPAROSCOPIC BILATERAL SALPINGECTOMY (Bilateral)  Patient Location: PACU  Anesthesia Type:General  Level of Consciousness: awake, alert , oriented and patient cooperative  Airway and Oxygen Therapy: Patient Spontanous Breathing and Patient connected to face mask oxygen  Post-op Pain: mild  Post-op Assessment: Post-op Vital signs reviewed, Patient's Cardiovascular Status Stable, Respiratory Function Stable, Patent Airway and No signs of Nausea or vomiting  Post-op Vital Signs: Reviewed and stable  Complications: No apparent anesthesia complications

## 2013-01-14 NOTE — Anesthesia Preprocedure Evaluation (Signed)
Anesthesia Evaluation  Patient identified by MRN, date of birth, ID band Patient awake    Reviewed: Allergy & Precautions, H&P , NPO status , Patient's Chart, lab work & pertinent test results  History of Anesthesia Complications (+) PONV  Airway Mallampati: I TM Distance: >3 FB Neck ROM: Full    Dental  (+) Teeth Intact   Pulmonary asthma ,  breath sounds clear to auscultation        Cardiovascular negative cardio ROS  Rhythm:Regular Rate:Normal     Neuro/Psych    GI/Hepatic GERD-  ,  Endo/Other  Hypothyroidism   Renal/GU      Musculoskeletal  (+) Fibromyalgia -  Abdominal   Peds  Hematology   Anesthesia Other Findings   Reproductive/Obstetrics                           Anesthesia Physical Anesthesia Plan  ASA: II  Anesthesia Plan: General   Post-op Pain Management:    Induction: Intravenous, Rapid sequence and Cricoid pressure planned  Airway Management Planned: Oral ETT  Additional Equipment:   Intra-op Plan:   Post-operative Plan: Extubation in OR  Informed Consent: I have reviewed the patients History and Physical, chart, labs and discussed the procedure including the risks, benefits and alternatives for the proposed anesthesia with the patient or authorized representative who has indicated his/her understanding and acceptance.     Plan Discussed with:   Anesthesia Plan Comments:         Anesthesia Quick Evaluation

## 2013-01-14 NOTE — H&P (Signed)
Valerie Henderson is an 47 y.o. female. She is admitted for Hysteroscopy with Dilation and curettage, Endometrial Ablation, as well as sterilization by bilateral salpingectomy.  She has been seen in the office for irregular and heavy bleeding , with ultrasound showing a slightly enlarged uterus, 10 cm long x 5 cm x 6 cm, several small intramural fibroids suspected, with a thin endometrium at 5.3 mm, containing a small polyp or blood clot.   She tolerates the discomfort of menses poorly due to her fibromyalgia, for which she has been placed on Lyrica.  Tissue sampling at the time of hysteroscopy will be performed.   In addition, the patient requests permanent sterilization, which will be performed by bilateral salpingectomy to reduce the risk of future tubal or ovarian neoplasm.    Treatment alternatives discussed such as endometrial biopsy and subsequent IUD placement discussed during preop discussion, and hysteroscopy treatment chosen.     Pertinent Gynecological History: Menses: becoming slightly irregular, perimenopausal. Bleeding:  Contraception: salpingectomy planned,  negative pregnancy test 9/15 DES exposure: unknown Blood transfusions: none Sexually transmitted diseases: no past history and negative GC/Chl at last pap Previous GYN Procedures:   Last mammogram: normal Date: 05/2012 Last pap: normal Date: feb 2013 OB History: G, P   Menstrual History: Menarche age:  No LMP recorded.    Past Medical History  Diagnosis Date  . Asthma   . GERD (gastroesophageal reflux disease)   . Insomnia   . Anemia   . Hypothyroidism   . Irregular bleeding 12/05/2012  . Hypothyroid 12/05/2012  . Constipation 12/05/2012  . Fibromyalgia 12/16/2012  . Complication of anesthesia   . PONV (postoperative nausea and vomiting)     Past Surgical History  Procedure Laterality Date  . Cholecystectomy      Family History  Problem Relation Age of Onset  . Heart disease Mother     heart attack  . Heart  disease Father     heart attack  . Cancer Maternal Aunt     breast  . Cancer Maternal Grandmother     cervical  . Cancer Maternal Grandfather     lung  . Cancer Paternal Grandmother     cervical   . Hypertension Other     Social History:  reports that she has never smoked. She has never used smokeless tobacco. She reports that  drinks alcohol. She reports that she does not use illicit drugs.  Allergies: No Known Allergies  No prescriptions prior to admission    ROS  There were no vitals taken for this visit. There were no vitals taken for this visit. Physical Exam Physical Examination: General appearance - alert, well appearing, and in no distress, oriented to person, place, and time and overweight Mental status - alert, oriented to person, place, and time, normal mood, behavior, speech, dress, motor activity, and thought processes Eyes - pupils equal and reactive, extraocular eye movements intact Chest - clear to auscultation, no wheezes, rales or rhonchi, symmetric air entry Heart - normal rate and regular rhythm Abdomen - soft, nontender, nondistended, no masses or organomegaly Pelvic - VULVA: normal appearing vulva with no masses, tenderness or lesions, VAGINA: normal appearing vagina with normal color and discharge, no lesions, CERVIX: normal appearing cervix without discharge or lesions, on menses, UTERUS: uterus is normal size, shape, consistency and nontender, enlarged to 6-8 week's size, ADNEXA: normal adnexa in size, nontender and no masses   Results for orders placed during the hospital encounter of 01/13/13 (from the past  24 hour(s))  URINALYSIS, ROUTINE W REFLEX MICROSCOPIC     Status: Abnormal   Collection Time    01/13/13  8:31 AM      Result Value Range   Color, Urine YELLOW  YELLOW   APPearance CLEAR  CLEAR   Specific Gravity, Urine 1.010  1.005 - 1.030   pH 6.0  5.0 - 8.0   Glucose, UA NEGATIVE  NEGATIVE mg/dL   Hgb urine dipstick LARGE (*) NEGATIVE    Bilirubin Urine NEGATIVE  NEGATIVE   Ketones, ur NEGATIVE  NEGATIVE mg/dL   Protein, ur NEGATIVE  NEGATIVE mg/dL   Urobilinogen, UA 0.2  0.0 - 1.0 mg/dL   Nitrite NEGATIVE  NEGATIVE   Leukocytes, UA NEGATIVE  NEGATIVE  URINE MICROSCOPIC-ADD ON     Status: Abnormal   Collection Time    01/13/13  8:31 AM      Result Value Range   Squamous Epithelial / LPF MANY (*) RARE   RBC / HPF TOO NUMEROUS TO COUNT  <3 RBC/hpf   Bacteria, UA FEW (*) RARE  CBC     Status: Abnormal   Collection Time    01/13/13  8:32 AM      Result Value Range   WBC 10.6 (*) 4.0 - 10.5 K/uL   RBC 4.26  3.87 - 5.11 MIL/uL   Hemoglobin 11.9 (*) 12.0 - 15.0 g/dL   HCT 14.7  82.9 - 56.2 %   MCV 85.7  78.0 - 100.0 fL   MCH 27.9  26.0 - 34.0 pg   MCHC 32.6  30.0 - 36.0 g/dL   RDW 13.0  86.5 - 78.4 %   Platelets 287  150 - 400 K/uL  COMPREHENSIVE METABOLIC PANEL     Status: Abnormal   Collection Time    01/13/13  8:32 AM      Result Value Range   Sodium 137  135 - 145 mEq/L   Potassium 4.6  3.5 - 5.1 mEq/L   Chloride 101  96 - 112 mEq/L   CO2 27  19 - 32 mEq/L   Glucose, Bld 94  70 - 99 mg/dL   BUN 10  6 - 23 mg/dL   Creatinine, Ser 6.96  0.50 - 1.10 mg/dL   Calcium 9.1  8.4 - 29.5 mg/dL   Total Protein 6.7  6.0 - 8.3 g/dL   Albumin 3.2 (*) 3.5 - 5.2 g/dL   AST 12  0 - 37 U/L   ALT 11  0 - 35 U/L   Alkaline Phosphatase 52  39 - 117 U/L   Total Bilirubin 0.3  0.3 - 1.2 mg/dL   GFR calc non Af Amer >90  >90 mL/min   GFR calc Af Amer >90  >90 mL/min  HCG, QUANTITATIVE, PREGNANCY     Status: None   Collection Time    01/13/13  8:33 AM      Result Value Range   hCG, Beta Chain, Quant, S <1  <5 mIU/mL    No results found.  Assessment/Plan: Menorrhagia, suspected endometrial polyp Desire for sterilization Plan:  Hysteroscopy, Dilation and curettage, with endometrial ablation           Sterilization by bilateral salpingectomy.  Mussa Groesbeck V 01/14/2013, 6:30 AM

## 2013-01-14 NOTE — Transfer of Care (Signed)
Immediate Anesthesia Transfer of Care Note  Patient: BEATRYCE COLOMBO  Procedure(s) Performed: Procedure(s) with comments: DILATATION & CURETTAGE/HYSTEROSCOPY WITH THERMACHOICE ABLATION (N/A) - 8 ml D5W total in, 8 ml D5W total out; 86-88 degrees Celcius; 8 minutes and 39 seconds total time LAPAROSCOPIC BILATERAL SALPINGECTOMY (Bilateral)  Patient Location: PACU  Anesthesia Type:General  Level of Consciousness: awake, alert  and patient cooperative  Airway & Oxygen Therapy: Patient Spontanous Breathing and Patient connected to face mask oxygen  Post-op Assessment: Report given to PACU RN and Post -op Vital signs reviewed and stable  Post vital signs: Reviewed and stable  Complications: No apparent anesthesia complications

## 2013-01-14 NOTE — Interval H&P Note (Signed)
History and Physical Interval Note:  01/14/2013 9:40 AM  Valerie Henderson  has presented today for surgery, with the diagnosis of menometorrhagia, desires sterilization  The various methods of treatment have been discussed with the patient and family. After consideration of risks, benefits and other options for treatment, the patient has consented to  Procedure(s): DILATATION & CURETTAGE/HYSTEROSCOPY WITH THERMACHOICE ABLATION (N/A) LAPAROSCOPIC BILATERAL SALPINGECTOMY (Bilateral) as a surgical intervention .  The patient's history has been reviewed, patient examined, no change in status, stable for surgery.  I have reviewed the patient's chart and labs.  Questions were answered to the patient's satisfaction.   She finished pill pack last week and just began her menses. She has a menstrual migraine this morning. No other changes in exam or plans.   Valerie Henderson

## 2013-01-14 NOTE — Brief Op Note (Signed)
01/14/2013  12:29 PM  PATIENT:  Valerie Henderson  47 y.o. female  PRE-OPERATIVE DIAGNOSIS:  menometorrhagia, desires sterilization  POST-OPERATIVE DIAGNOSIS:  menometorrhagia, desires sterilization  PROCEDURE:  Procedure(s) with comments: DILATATION & CURETTAGE/HYSTEROSCOPY WITH THERMACHOICE ABLATION (N/A) - 8 ml D5W total in, 8 ml D5W total out; 86-88 degrees Celcius; 8 minutes and 39 seconds total time LAPAROSCOPIC BILATERAL SALPINGECTOMY (Bilateral)  SURGEON:  Surgeon(s) and Role:    * Tilda Burrow, MD - Primary  PHYSICIAN ASSISTANT:   ASSISTANTS: none   ANESTHESIA:   local, general and paracervical block  EBL:  Total I/O In: 1700 [I.V.:1700] Out: 400 [Urine:350; Blood:50]  BLOOD ADMINISTERED:none  DRAINS: none   LOCAL MEDICATIONS USED:  MARCAINE    and Amount: 20 ml  SPECIMEN:  Source of Specimen:  endometrial curettings bilateral fallopian tubes  DISPOSITION OF SPECIMEN:  PATHOLOGY  COUNTS:  YES  TOURNIQUET:  * No tourniquets in log *  DICTATION: .Dragon Dictation  PLAN OF CARE: Discharge to home after PACU  PATIENT DISPOSITION:  PACU - hemodynamically stable.   Delay start of Pharmacological VTE agent (>24hrs) due to surgical blood loss or risk of bleeding: not applicable

## 2013-01-15 ENCOUNTER — Encounter (HOSPITAL_COMMUNITY): Payer: Self-pay | Admitting: Obstetrics and Gynecology

## 2013-01-20 ENCOUNTER — Encounter: Payer: Self-pay | Admitting: Obstetrics and Gynecology

## 2013-01-22 ENCOUNTER — Encounter: Payer: 59 | Admitting: Obstetrics and Gynecology

## 2013-01-29 ENCOUNTER — Encounter: Payer: Self-pay | Admitting: Obstetrics and Gynecology

## 2013-01-29 ENCOUNTER — Ambulatory Visit (INDEPENDENT_AMBULATORY_CARE_PROVIDER_SITE_OTHER): Payer: 59 | Admitting: Obstetrics and Gynecology

## 2013-01-29 VITALS — BP 120/80 | Ht 60.0 in | Wt 202.0 lb

## 2013-01-29 DIAGNOSIS — Z9889 Other specified postprocedural states: Secondary | ICD-10-CM

## 2013-01-29 DIAGNOSIS — G4700 Insomnia, unspecified: Secondary | ICD-10-CM

## 2013-01-29 MED ORDER — ZOLPIDEM TARTRATE 5 MG PO TABS: 5.0000 mg | ORAL_TABLET | Freq: Every evening | ORAL | Status: DC | PRN

## 2013-01-29 NOTE — Patient Instructions (Signed)
Keep a calendar of your future periods. Let me know if it is heavy or irregular We have discussed the biopsy results

## 2013-01-29 NOTE — Progress Notes (Signed)
Patient ID: Valerie Henderson, female   DOB: 02-Jul-1965, 47 y.o.   MRN: [id number] Pt here for post op visit. Pt had ablation and polyp removal on 01/14/13. Pt states she is still having a pinkish discharge, and having some pain on the left side when she voids. Pt states that she does have some pains here and there but she thinks it is normal. ROS:  Slight weekend pain, feels some pain on left with voiding, only in last wk. Minimal pain immed postop "a Lot" of watery pink d/c. Physical Examination: General appearance - alert, well appearing, and in no distress Mental status - alert, oriented to person, place, and time, affect appropriate to mood Abdomen - soft, nontender, nondistended, no masses or organomegaly Pelvic - VULVA: normal appearing vulva with no masses, tenderness or lesions, VAGINA: normal appearing vagina with normal color and discharge, no lesions, watery lite discharge , CERVIX: normal appearing cervix without discharge or lesions, appropriate watery d/c pink from uterus ,no odor, UTERUS: uterus is normal size, shape, consistency and nontender, midplane, low mobility, ADNEXA: normal adnexa in size, nontender and no masses, no masses, surgically absent tubes.

## 2013-02-10 ENCOUNTER — Telehealth: Payer: Self-pay | Admitting: Obstetrics and Gynecology

## 2013-02-10 NOTE — Telephone Encounter (Signed)
Pt had surgery on 01/14/2013, endo ablation, now having heavy bleeding, no odor. Appt made for pt to see Dr. Emelda Fear for Wednesday at 11:15 pm.

## 2013-02-12 ENCOUNTER — Encounter: Payer: 59 | Admitting: Obstetrics and Gynecology

## 2013-02-24 ENCOUNTER — Telehealth: Payer: Self-pay | Admitting: Adult Health

## 2013-02-24 NOTE — Telephone Encounter (Signed)
Left message

## 2013-02-24 NOTE — Telephone Encounter (Signed)
Tamiya called back complains of body aches,like lyrica except for swelling told her to reach neurotin and cymbalta and let me know which she wants to try.

## 2013-02-24 NOTE — Telephone Encounter (Signed)
Pt states had surgery 01/14/2013, had stopped Lyrica due to swelling in lower extremities per Victorino Dike. Pt states recovering from surgery now and would like to discuss medication options verses Lyrica.

## 2013-02-25 ENCOUNTER — Telehealth: Payer: Self-pay | Admitting: Adult Health

## 2013-02-25 MED ORDER — GABAPENTIN 300 MG PO CAPS: 300.0000 mg | ORAL_CAPSULE | Freq: Three times a day (TID) | ORAL | Status: DC

## 2013-02-25 NOTE — Telephone Encounter (Signed)
Pt wants to try neurotin will rx 300 mg tid

## 2013-02-25 NOTE — Telephone Encounter (Signed)
Pt states would like to try Neurontin because of less side effects.

## 2013-03-06 ENCOUNTER — Other Ambulatory Visit: Payer: Self-pay

## 2013-07-17 ENCOUNTER — Ambulatory Visit (INDEPENDENT_AMBULATORY_CARE_PROVIDER_SITE_OTHER): Payer: 59 | Admitting: Obstetrics and Gynecology

## 2013-07-17 ENCOUNTER — Encounter: Payer: Self-pay | Admitting: Obstetrics and Gynecology

## 2013-07-17 VITALS — BP 120/60 | Ht 60.0 in | Wt 212.4 lb

## 2013-07-17 DIAGNOSIS — N946 Dysmenorrhea, unspecified: Secondary | ICD-10-CM

## 2013-07-17 NOTE — Patient Instructions (Signed)
Intrauterine Device Information An intrauterine device (IUD) is inserted into your uterus to prevent pregnancy. There are two types of IUDs available:   Copper IUD This type of IUD is wrapped in copper wire and is placed inside the uterus. Copper makes the uterus and fallopian tubes produce a fluid that kills sperm. The copper IUD can stay in place for 10 years.  Hormone IUD This type of IUD contains the hormone progestin (synthetic progesterone). The hormone thickens the cervical mucus and prevents sperm from entering the uterus. It also thins the uterine lining to prevent implantation of a fertilized egg. The hormone can weaken or kill the sperm that get into the uterus. One type of hormone IUD can stay in place for 5 years, and another type can stay in place for 3 years. Your health care provider will make sure you are a good candidate for a contraceptive IUD. Discuss with your health care provider the possible side effects.  ADVANTAGES OF AN INTRAUTERINE DEVICE  IUDs are highly effective, reversible, long acting, and low maintenance.   There are no estrogen-related side effects.   An IUD can be used when breastfeeding.   IUDs are not associated with weight gain.   The copper IUD works immediately after insertion.   The hormone IUD works right away if inserted within 7 days of your period starting. You will need to use a backup method of birth control for 7 days if the hormone IUD is inserted at any other time in your cycle.  The copper IUD does not interfere with your female hormones.   The hormone IUD can make heavy menstrual periods lighter and decrease cramping.   The hormone IUD can be used for 3 or 5 years.   The copper IUD can be used for 10 years. DISADVANTAGES OF AN INTRAUTERINE DEVICE  The hormone IUD can be associated with irregular bleeding patterns.   The copper IUD can make your menstrual flow heavier and more painful.   You may experience cramping and  vaginal bleeding after insertion.  Document Released: 03/21/2004 Document Revised: 12/18/2012 Document Reviewed: 10/06/2012 [employer] Patient Information 2014 [employer].  

## 2013-07-17 NOTE — Progress Notes (Signed)
This chart was scribed by Ludger Nutting, Medical Scribe, for Dr. Mallory Shirk on 07/17/13 at 3:48 PM. This chart was reviewed by Dr. Mallory Shirk and is accurate.   [facility] Visit  Patient name: Valerie Henderson MRN [id number]  Date of birth: 06/11/1965  CC & HPI:  Valerie Henderson is a 48 y.o. female presenting today for heavy and prolonged menses. She reports having an endometrial ablation in 12/2012. She reports initially having thin vaginal bleeding with her periods. She states the bleeding is becoming heavier with each month. States her most current period is 7-8 days long, started on 07/09/13.   ROS:  Negative except listed above  Pertinent History Reviewed:  Medical & Surgical Hx:  Reviewed: Significant for   Past Medical History  Diagnosis Date  . Asthma   . GERD (gastroesophageal reflux disease)   . Insomnia   . Anemia   . Hypothyroidism   . Irregular bleeding 12/05/2012    Endometrial  ablation 9/14  . Hypothyroid 12/05/2012  . Constipation 12/05/2012  . Fibromyalgia 12/16/2012  . Complication of anesthesia   . PONV (postoperative nausea and vomiting)     Past Surgical History  Procedure Laterality Date  . Cholecystectomy    . Dilitation & currettage/hystroscopy with thermachoice ablation N/A 01/14/2013    Procedure: DILATATION & CURETTAGE/HYSTEROSCOPY WITH THERMACHOICE ABLATION;  Surgeon: Jonnie Kind, MD;  Location: [facility];  Service: Gynecology;  Laterality: N/A;  8 ml D5W total in, 8 ml D5W total out; 86-88 degrees Celcius; 8 minutes and 39 seconds total time  . Laparoscopic bilateral salpingectomy Bilateral 01/14/2013    Procedure: LAPAROSCOPIC BILATERAL SALPINGECTOMY;  Surgeon: Jonnie Kind, MD;  Location: [facility];  Service: Gynecology;  Laterality: Bilateral;     Medications: Reviewed & Updated - see associated section Social History: Reviewed -  reports that she has quit smoking. She has never used smokeless tobacco.  Objective Findings:  Vitals: BP 120/60   Ht 5' (1.524 m)  Wt 212 lb 6.4 oz (96.344 kg)  BMI 41.48 kg/m2  LMP 07/09/2013  Physical Examination: General appearance - alert, well appearing, and in no distress and oriented to person, place, and time Pelvic - Chaperone present  VULVA: normal appearing vulva with no masses, tenderness or lesions,  VAGINA: normal appearing vagina with normal color and discharge, no lesions, CERVIX: normal appearing cervix without discharge or lesions, well supported and very firm.  UTERUS: uterus is normal size, shape, consistency and nontender,  ADNEXA: normal adnexa in size, nontender and no masses   Assessment & Plan:   A: 1. Dysmenorrhea   2. S/p endometrial ablation 2014  P: 1. Pelvic U/s in 3 weeks, to check endometrial stripe 2. Discussed IUD for long term suppression of uterine bleeding

## 2013-07-22 ENCOUNTER — Other Ambulatory Visit: Payer: Self-pay | Admitting: Obstetrics and Gynecology

## 2013-07-22 DIAGNOSIS — Z1231 Encounter for screening mammogram for malignant neoplasm of breast: Secondary | ICD-10-CM

## 2013-07-29 ENCOUNTER — Telehealth: Payer: Self-pay | Admitting: *Deleted

## 2013-07-29 NOTE — Telephone Encounter (Signed)
Pt is going to call [facility] to have them send [place] records.

## 2013-07-30 ENCOUNTER — Ambulatory Visit (HOSPITAL_COMMUNITY)
Admission: RE | Admit: 2013-07-30 | Discharge: 2013-07-30 | Disposition: A | Discharge status: Home / Self Care | Payer: 59 | Source: Ambulatory Visit | Attending: Allergy and Immunology | Admitting: Allergy and Immunology

## 2013-07-30 ENCOUNTER — Other Ambulatory Visit: Payer: Self-pay | Admitting: Allergy and Immunology

## 2013-07-30 DIAGNOSIS — R0602 Shortness of breath: Secondary | ICD-10-CM | POA: Insufficient documentation

## 2013-07-30 DIAGNOSIS — J45909 Unspecified asthma, uncomplicated: Secondary | ICD-10-CM

## 2013-07-30 DIAGNOSIS — R0600 Dyspnea, unspecified: Secondary | ICD-10-CM

## 2013-08-05 ENCOUNTER — Other Ambulatory Visit: Payer: Self-pay | Admitting: Obstetrics and Gynecology

## 2013-08-05 DIAGNOSIS — N946 Dysmenorrhea, unspecified: Secondary | ICD-10-CM

## 2013-08-06 ENCOUNTER — Encounter: Payer: Self-pay | Admitting: Obstetrics and Gynecology

## 2013-08-06 ENCOUNTER — Ambulatory Visit (INDEPENDENT_AMBULATORY_CARE_PROVIDER_SITE_OTHER): Payer: 59 | Admitting: Obstetrics and Gynecology

## 2013-08-06 ENCOUNTER — Ambulatory Visit (INDEPENDENT_AMBULATORY_CARE_PROVIDER_SITE_OTHER): Payer: 59

## 2013-08-06 VITALS — BP 132/90 | Ht 60.0 in | Wt 210.0 lb

## 2013-08-06 DIAGNOSIS — N920 Excessive and frequent menstruation with regular cycle: Secondary | ICD-10-CM

## 2013-08-06 DIAGNOSIS — N946 Dysmenorrhea, unspecified: Secondary | ICD-10-CM

## 2013-08-06 DIAGNOSIS — N882 Stricture and stenosis of cervix uteri: Secondary | ICD-10-CM

## 2013-08-06 MED ORDER — PHENTERMINE HCL 37.5 MG PO CAPS: 37.5000 mg | ORAL_CAPSULE | ORAL | Status: DC

## 2013-08-06 NOTE — Progress Notes (Signed)
This chart was scribed by Jenne Campus, Medical Scribe, for Dr. Mallory Shirk on 08/06/13 at 3:02 PM. This chart was reviewed by Dr. Mallory Shirk and is accurate.   [facility] Visit  Patient name: Valerie Henderson MRN [id number]  Date of birth: April 22, 1966  CC & HPI:  Valerie Henderson is a 48 y.o. female presenting today for severe suprapubic pain with menorrhagia. Reports symptoms are worsening. H/O endometrial polyps treated with endometrial ablation in September 2104 with no improvement. SHG was attempted today but was unable to be performed due to cervical stenosis.   Transvaginal US done on 08/06/13 Anteverted uterus noted with asymmetrical endometrial cavity noted, fundal area of thickening noted within endom. Cavity ?polyp with +Doppler flow noted(SHG attempted but Dr. Glo Herring was unable to insert catheter into cx due to stenosis)   ROS:  +menorrhagia +suprapubic pain No other complaints   Pertinent History Reviewed:  Medical & Surgical Hx:  Reviewed: Significant for endometrial polyps, bilateral salpingectomy  Medications: Reviewed & Updated - see associated section Social History: Reviewed -  reports that she has quit smoking. She has never used smokeless tobacco.  Objective Findings:  Vitals: BP 132/90  Ht 5' (1.524 m)  Wt 210 lb (95.255 kg)  BMI 41.01 kg/m2  LMP 07/09/2013  Physical Examination: General appearance - alert, well appearing, and in no distress, oriented to person, place, and time and overweight Mental status - alert, oriented to person, place, and time, normal mood, behavior, speech, dress, motor activity, and thought processes   Assessment & Plan:  A: 1. Menorrhagia  2. Cervical Stenosis   P: 1. Schedule supracervical hysterectomy  2. Preop exam in 2 weeks 3. Pt given prescription for phentermine for weight loss preceding surgery

## 2013-08-06 NOTE — Patient Instructions (Signed)
Supracervical Hysterectomy A supracervical hysterectomy is surgery to remove the top part of the uterus, but not the cervix. You will no longer have menstrual periods or be able to get pregnant after this surgery. The fallopian tubes and ovaries may also be removed (bilateral salpingo-oophorectomy) during this surgery. This surgery is usually performed using a minimally invasive technique called laparoscopy. This technique allows the surgery to be done through small incisions. The minimally invasive technique provides benefits such as less pain, less risk of infection, and shorter recovery time. LET [facility] CARE PROVIDER KNOW ABOUT:  Any allergies you have.  All medicines you are taking, including vitamins, herbs, eye drops, creams, and over-the-counter medicines.  Previous problems you or members of your family have had with the use of anesthetics.  Any blood disorders you have.  Previous surgeries you have had.  Medical conditions you have. RISKS AND COMPLICATIONS  Generally, this is a safe procedure. However, as with any procedure, complications can occur. Possible complications include:  Bleeding.  Blood clots in the legs or lung.  Infection.  Injury to surrounding organs.  Problems related to anesthesia.  Conversion to an open abdominal surgery.  Additional surgery later to remove the cervix if you have problems with the cervix. BEFORE THE PROCEDURE  Ask your health care provider about changing or stopping your regular medicines.  Do not take aspirin or blood thinners (anticoagulants) for 1 week before the surgery, or as directed by your health care provider.  Do not eat or drink anything for 8 hours before the surgery, or as directed by your health care provider.  Quit smoking if you smoke.  Arrange for a ride home after surgery and for someone to help you at home during recovery. PROCEDURE   You will be given an antibiotic medicine.  An IV tube will be placed  in one of your veins. You will be given medicine to make you sleep (general anesthetic).  A gas (carbon dioxide) will be used to inflate your abdomen. This will allow your surgeon to look inside your abdomen, perform your surgery, and treat any other problems found if necessary.  Three or four small incisions will be made in your abdomen. One of these incisions will be made in the area of your belly button (navel). A thin, flexible tube with a tiny camera and light on the end of it (laparoscope) will be inserted into the incision. The camera on the laparoscope sends a picture to a TV screen in the operating room. This gives your surgeon a good view inside the abdomen.  Other surgical instruments will be inserted through the other incisions.  The uterus will be cut into small pieces and removed through the small incisions.  Your incisions will be closed. AFTER THE PROCEDURE   You will be taken to a recovery area where your progress will be monitored until you are awake, stable, and taking fluids well. If there are no other problems, you will then be moved to a regular hospital room, or you will be allowed to go home.  You will likely have minimal discomfort after the surgery because the incisions are so small with the laparoscopic technique.  You will be given pain medicine while you are in the hospital and for when you go home.  If a bilateral salpingo-oophorectomy was performed before menopause, you will go through a sudden (abrupt) menopause. This can be helped with hormone medicines. Document Released: 10/04/2007 Document Revised: 02/05/2013 Document Reviewed: 10/18/2012 Exit[employer] Patient Information  2014 [place], [place].

## 2013-08-07 ENCOUNTER — Ambulatory Visit (HOSPITAL_COMMUNITY)
Admission: RE | Admit: 2013-08-07 | Discharge: 2013-08-07 | Disposition: A | Discharge status: Home / Self Care | Payer: 59 | Source: Ambulatory Visit | Attending: Obstetrics and Gynecology | Admitting: Obstetrics and Gynecology

## 2013-08-07 DIAGNOSIS — Z1231 Encounter for screening mammogram for malignant neoplasm of breast: Secondary | ICD-10-CM | POA: Insufficient documentation

## 2013-08-18 ENCOUNTER — Ambulatory Visit (INDEPENDENT_AMBULATORY_CARE_PROVIDER_SITE_OTHER): Payer: 59 | Admitting: Gastroenterology

## 2013-08-18 ENCOUNTER — Encounter: Payer: Self-pay | Admitting: Gastroenterology

## 2013-08-18 VITALS — BP 148/88 | HR 92 | Temp 98.1°F | Ht 60.0 in | Wt 205.0 lb

## 2013-08-18 DIAGNOSIS — K5900 Constipation, unspecified: Secondary | ICD-10-CM

## 2013-08-18 MED ORDER — LINACLOTIDE 145 MCG PO CAPS: 145.0000 ug | ORAL_CAPSULE | Freq: Every day | ORAL | Status: DC

## 2013-08-18 NOTE — Assessment & Plan Note (Signed)
48 year old female with chronic constipation but no concerning features. Likely multifactorial. No prior colonoscopy or FH of colon cancer. Discussed starting Linzess 145 mcg daily, obtain ifobt. If positive, proceed with colonoscopy. If normal, no colonoscopy until age 89 or otherwise indicated. Return in 3 months.

## 2013-08-18 NOTE — Progress Notes (Signed)
Primary Care Physician:  Purvis Kilts, MD Primary Gastroenterologist:  Dr. Gala Romney   Chief Complaint  Patient presents with  . Constipation  . Abdominal Pain    HPI:   Valerie Henderson presents today as a self-referral due to chronic constipation. Whole life. States a few times a year would have a "clean out" with diaphoresis, diarrhea, then will pass. Eating fiber to stay regular. Seldom has a "regular" BM. Sometimes very small, hard balls. Sometimes with mucus in it. Vague, intermittent lower abdominal cramping/pain associated with constipation. No rectal bleeding. No unexplained weight loss or lack of appetite. Partial hysterectomy upcoming. Prescribed phentermine recently to assist with weight loss prior to procedure.    Past Medical History  Diagnosis Date  . Asthma   . GERD (gastroesophageal reflux disease)   . Insomnia   . Anemia   . Hypothyroidism   . Irregular bleeding 12/05/2012    Endometrial  ablation 9/14  . Hypothyroid 12/05/2012  . Constipation 12/05/2012  . Fibromyalgia 12/16/2012  . Complication of anesthesia   . PONV (postoperative nausea and vomiting)     Past Surgical History  Procedure Laterality Date  . Cholecystectomy    . Dilitation & currettage/hystroscopy with thermachoice ablation N/A 01/14/2013    Procedure: DILATATION & CURETTAGE/HYSTEROSCOPY WITH THERMACHOICE ABLATION;  Surgeon: Jonnie Kind, MD;  Location: [facility];  Service: Gynecology;  Laterality: N/A;  8 ml D5W total in, 8 ml D5W total out; 86-88 degrees Celcius; 8 minutes and 39 seconds total time  . Laparoscopic bilateral salpingectomy Bilateral 01/14/2013    Procedure: LAPAROSCOPIC BILATERAL SALPINGECTOMY;  Surgeon: Jonnie Kind, MD;  Location: [facility];  Service: Gynecology;  Laterality: Bilateral;    Current Outpatient Prescriptions  Medication Sig Dispense Refill  . albuterol (PROVENTIL HFA;VENTOLIN HFA) 108 (90 BASE) MCG/ACT inhaler Inhale 2 puffs into the lungs every 6 (six) hours  as needed. For breathing       . Ascorbic Acid (VITAMIN C GUMMIE PO) Take by mouth.      . budesonide-formoterol (SYMBICORT) 80-4.5 MCG/ACT inhaler Inhale 2 puffs into the lungs 2 (two) times daily.        Marland Kitchen gabapentin (NEURONTIN) 300 MG capsule Take 1 capsule (300 mg total) by mouth 3 (three) times daily.  90 capsule  1  . levothyroxine (SYNTHROID, LEVOTHROID) 100 MCG tablet take 1 tablet once daily  30 tablet  12  . loratadine (CLARITIN) 10 MG tablet Take 10 mg by mouth daily.        . phentermine 37.5 MG capsule Take 1 capsule (37.5 mg total) by mouth every morning.  30 capsule  1  . valACYclovir (VALTREX) 1000 MG tablet Take 1,000 mg by mouth as needed.       . zolpidem (AMBIEN) 5 MG tablet Take 1 tablet (5 mg total) by mouth at bedtime as needed for sleep.  30 tablet  2   No current facility-administered medications for this visit.    Allergies as of 08/18/2013  . (No Known Allergies)    Family History  Problem Relation Age of Onset  . Heart disease Mother     heart attack  . Heart disease Father     heart attack  . Cancer Maternal Aunt     breast  . Cancer Maternal Grandmother     cervical  . Cancer Maternal Grandfather     lung  . Cancer Paternal Grandmother     cervical   . Hypertension Other   .  Colon cancer Neg Hx     History   Social History  . Marital Status: Married    Spouse Name: N/A    Number of Children: N/A  . Years of Education: N/A   Occupational History  . self-employed     Hair by [employer]   Social History Main Topics  . Smoking status: Former Research scientist (life sciences)  . Smokeless tobacco: Never Used     Comment: high school   . Alcohol Use: Yes     Comment: occassional  . Drug Use: No  . Sexual Activity: Yes    Birth Control/ Protection: Surgical   Other Topics Concern  . Not on file   Social History Narrative  . No narrative on file    Review of Systems: Gen: Denies any fever, chills, fatigue, weight loss, lack of appetite.  CV: Denies chest pain,  heart palpitations, peripheral edema, syncope.  Resp: Denies shortness of breath at rest or with exertion. Denies wheezing or cough.  GI: see HPI GU : Denies urinary burning, urinary frequency, urinary hesitancy MS: +joint pain Derm: Denies rash, itching, dry skin Psych: Denies depression, anxiety, memory loss, and confusion Heme: Denies bruising, bleeding, and enlarged lymph nodes.  Physical Exam: BP 148/88  Pulse 92  Temp(Src) 98.1 F (36.7 C) (Oral)  Ht 5' (1.524 m)  Wt 205 lb (92.987 kg)  BMI 40.04 kg/m2  LMP 08/15/2013 General:   Alert and oriented. Pleasant and cooperative. Well-nourished and well-developed.  Head:  Normocephalic and atraumatic. Eyes:  Without icterus, sclera clear and conjunctiva pink.  Ears:  Normal auditory acuity. Nose:  No deformity, discharge,  or lesions. Mouth:  No deformity or lesions, oral mucosa pink.  Neck:  Supple, without mass or thyromegaly. Lungs:  Clear to auscultation bilaterally. No wheezes, rales, or rhonchi. No distress.  Heart:  S1, S2 present without murmurs appreciated.  Abdomen:  +BS, soft, non-tender and non-distended. No HSM noted. No guarding or rebound. No masses appreciated.  Rectal:  Deferred  Msk:  Symmetrical without gross deformities. Normal posture. Extremities:  Without clubbing or edema. Neurologic:  Alert and  oriented x4;  grossly normal neurologically. Skin:  Intact without significant lesions or rashes. Cervical Nodes:  No significant cervical adenopathy. Psych:  Alert and cooperative. Normal mood and affect.

## 2013-08-18 NOTE — Patient Instructions (Signed)
Start taking Linzess 145 mcg (1 capsule) each morning on an empty stomach, 30 minutes before breakfast.   Complete the stool sample. If this is positive, we will schedule you for a colonoscopy.   Otherwise, we will see you back in 3 months!

## 2013-08-19 ENCOUNTER — Ambulatory Visit (INDEPENDENT_AMBULATORY_CARE_PROVIDER_SITE_OTHER): Payer: 59 | Admitting: Gastroenterology

## 2013-08-19 DIAGNOSIS — K5900 Constipation, unspecified: Secondary | ICD-10-CM

## 2013-08-19 LAB — IFOBT (OCCULT BLOOD): IMMUNOLOGICAL FECAL OCCULT BLOOD TEST: NEGATIVE

## 2013-08-19 NOTE — Progress Notes (Signed)
Pt return IFOBT test and it was NEGATIVE. 

## 2013-08-19 NOTE — Progress Notes (Signed)
Please let patient know she is heme negative. Return in 3 months as planned.

## 2013-08-19 NOTE — Progress Notes (Signed)
cc'd to pcp 

## 2013-08-20 NOTE — Progress Notes (Signed)
Pt is aware.  

## 2013-08-25 ENCOUNTER — Telehealth: Payer: Self-pay | Admitting: Internal Medicine

## 2013-08-25 ENCOUNTER — Encounter: Payer: Self-pay | Admitting: Obstetrics and Gynecology

## 2013-08-25 NOTE — Telephone Encounter (Signed)
Patient was calling to follow up on a PA for her lInzess. Please call her back at [phone]

## 2013-08-25 NOTE — Telephone Encounter (Signed)
Pt states she got a letter about her surgery saying it was denied. Pt was questioning the denial. I spoke with Dawn in insurance at our office about this and she said that Dr. Glo Herring is aware and he is going to call the insurance for a peer review and hopefully get the surgery approved. Pt aware that Dr. Glo Herring will have to do peer review and then call her with the decision.

## 2013-08-25 NOTE — Telephone Encounter (Signed)
Working on PA

## 2013-08-27 ENCOUNTER — Ambulatory Visit: Payer: 59 | Admitting: Gastroenterology

## 2013-08-27 NOTE — Telephone Encounter (Signed)
PA has been approved and faxed to the pharmacy 

## 2013-09-01 ENCOUNTER — Encounter (HOSPITAL_COMMUNITY): Payer: Self-pay | Admitting: Pharmacy Technician

## 2013-09-03 ENCOUNTER — Ambulatory Visit (INDEPENDENT_AMBULATORY_CARE_PROVIDER_SITE_OTHER): Payer: 59 | Admitting: Obstetrics and Gynecology

## 2013-09-03 VITALS — BP 118/82 | Ht 60.0 in | Wt 199.0 lb

## 2013-09-03 DIAGNOSIS — N926 Irregular menstruation, unspecified: Secondary | ICD-10-CM

## 2013-09-03 DIAGNOSIS — N882 Stricture and stenosis of cervix uteri: Secondary | ICD-10-CM

## 2013-09-03 DIAGNOSIS — N920 Excessive and frequent menstruation with regular cycle: Secondary | ICD-10-CM

## 2013-09-03 NOTE — Patient Instructions (Signed)
Hysteroscopy °Hysteroscopy is a procedure used for looking inside the womb (uterus). It may be done for various reasons, including: °· To evaluate abnormal bleeding, fibroid (benign, noncancerous) tumors, polyps, scar tissue (adhesions), and possibly cancer of the uterus. °· To look for lumps (tumors) and other uterine growths. °· To look for causes of why a woman cannot get pregnant (infertility), causes of recurrent loss of pregnancy (miscarriages), or a lost intrauterine device (IUD). °· To perform a sterilization by blocking the fallopian tubes from inside the uterus. °In this procedure, a thin, flexible tube with a tiny light and camera on the end of it (hysteroscope) is used to look inside the uterus. A hysteroscopy should be done right after a menstrual period to be sure you are not pregnant. °LET YOUR HEALTH CARE PROVIDER KNOW ABOUT:  °· Any allergies you have. °· All medicines you are taking, including vitamins, herbs, eye drops, creams, and over-the-counter medicines. °· Previous problems you or members of your family have had with the use of anesthetics. °· Any blood disorders you have. °· Previous surgeries you have had. °· Medical conditions you have. °RISKS AND COMPLICATIONS  °Generally, this is a safe procedure. However, as with any procedure, complications can occur. Possible complications include: °· Putting a hole in the uterus. °· Excessive bleeding. °· Infection. °· Damage to the cervix. °· Injury to other organs. °· Allergic reaction to medicines. °· Too much fluid used in the uterus for the procedure. °BEFORE THE PROCEDURE  °· Ask your health care provider about changing or stopping any regular medicines. °· Do not take aspirin or blood thinners for 1 week before the procedure, or as directed by your health care provider. These can cause bleeding. °· If you smoke, do not smoke for 2 weeks before the procedure. °· In some cases, a medicine is placed in the cervix the day before the procedure.  This medicine makes the cervix have a larger opening (dilate). This makes it easier for the instrument to be inserted into the uterus during the procedure. °· Do not eat or drink anything for at least 8 hours before the surgery. °· Arrange for someone to take you home after the procedure. °PROCEDURE  °· You may be given a medicine to relax you (sedative). You may also be given one of the following: °· A medicine that numbs the area around the cervix (local anesthetic). °· A medicine that makes you sleep through the procedure (general anesthetic). °· The hysteroscope is inserted through the vagina into the uterus. The camera on the hysteroscope sends a picture to a TV screen. This gives the surgeon a good view inside the uterus. °· During the procedure, air or a liquid is put into the uterus, which allows the surgeon to see better. °· Sometimes, tissue is gently scraped from inside the uterus. These tissue samples are sent to a lab for testing. °AFTER THE PROCEDURE  °· If you had a general anesthetic, you may be groggy for a couple hours after the procedure. °· If you had a local anesthetic, you will be able to go home as soon as you are stable and feel ready. °· You may have some cramping. This normally lasts for a couple days. °· You may have bleeding, which varies from light spotting for a few days to menstrual-like bleeding for 3 7 days. This is normal. °· If your test results are not back during the visit, make an appointment with your health care provider to find out   the results. °Document Released: 07/24/2000 Document Revised: 02/05/2013 Document Reviewed: 11/14/2012 °[employer]® Patient Information ©2014 [employer]. ° °

## 2013-09-03 NOTE — Progress Notes (Deleted)
This note was scribed for Mallory Shirk, MD, by Jenne Campus, Medical Scribe on 09/03/13. The information in this note was reviewed by Mallory Shirk, MD, and is accurate.   [facility] Visit  Patient name: Valerie Henderson MRN [id number]  Date of birth: [birth year]  CC & HPI:  Valerie Henderson is a 48 y.o. female presenting today for a f/u for suprapubic pain with menorrhagia during menses. Reports pain is so intense that she passes out and cramping is starting sooner and lasting later each month. Bleeding is "like water" and then "drips" for several days. She is having dyspareunia with a mild amount of spotting afterwards. H/O endometrial polyps treated with endometrial ablation in September 2104 with no improvement. SHG was attempted last visit but was unable to be performed due to cervical stenosis. Pt is s/p bilateral salpingectomy and endometrial ablation. Supracervical hysterectomy surgery is scheduled on May 19th, 2015. Pt given phenetamine last visit for to help with weight loss prior to surgery. Reports a 10 lb weight loss.   Transvaginal US done on 08/06/13  Anteverted uterus noted with asymmetrical endometrial cavity noted, fundal area of thickening noted within endom. Cavity ?polyp with +Doppler flow noted (SHG attempted but Dr. Glo Herring was unable to insert catheter into cx due to stenosis)   ROS:  +menorrhagia  +suprapubic pain +dysparuenia due to uterine contact, starting to avoid it due to discomfort  10 lb weight loss on phenetamine since last visit No complaints   Pertinent History Reviewed:  Medical & Surgical Hx:  Reviewed: Significant for s/p bilateral salpingectomy, endometrial ablation and cholecystectomy  Medications: Reviewed & Updated - see associated section Social History: Reviewed -  reports that she has quit smoking. She has never used smokeless tobacco.  Objective Findings:  Vitals: BP 118/82   Ht 5' (1.524 m)   Wt 199 lb (90.266 kg)   BMI 38.86 kg/m2   LMP  08/15/2013 Chaperone present for exam which was performed with pt's permission Physical Examination: General appearance: alert and cooperative Abdomen: soft, non-tender; bowel sounds normal; no masses,  no organomegaly Pelvic: external genitalia normal and positive findings: cervical stenosis, mid positioned uterus that does not move up  Assessment & Plan:  A: 1. Menorrhagia   2. Cervical stenosis 3. Dyspareunia due to uterine contact  P: 1. Hysteroscopy scheduled on 09/16/13  2. Speak with insurer tomorrow  3. F/u by phone in 2 days. We will call. 4. Supracervical hysterectomy if hysteroscopy fails to improve symptoms     *note deleted due to improper sharing

## 2013-09-09 ENCOUNTER — Telehealth: Payer: Self-pay | Admitting: Obstetrics and Gynecology

## 2013-09-09 NOTE — Telephone Encounter (Signed)
A telephone call to patient made to get full information regarding patient's dyspareunia complaints , in order to give full records to [employer].  Patient describes her complaints of dyspareunia, as being primarily deep penetration dyspareunia, " like he's hitting something", and that pain does not resolve with continued intimacy.  We discussed the pro & con of cervix preservation given this type of dyspareunia, and I have a concern that while vaginal lubrication may be improved with cervix preservation, there is some potential that cervix preservation could result in some residual dyspareunia from cervix contact even after hysterectomy. After this discussion, we have agreed that if hysterectomy is to be done, that removal of cervix is preferable. Ovarian preservation is desirable and planned for any surgical procedure.  Additionally, the patient gives the additional FAMILY HISTORY  Of both her maternal grandmother having had "uterine cancer" 56 yrs ago, and her Paternal grandmother having "uterine cancer" 22 yrs ago. No breast cancer mentioned.  Will provide all requested info to insurance reviewers, including this note.  Currently plans would be for abdominal hysterectomy with preservation of ovaries.

## 2013-09-11 ENCOUNTER — Other Ambulatory Visit: Payer: Self-pay | Admitting: Obstetrics and Gynecology

## 2013-09-11 ENCOUNTER — Encounter (HOSPITAL_COMMUNITY): Payer: Self-pay

## 2013-09-11 ENCOUNTER — Encounter (HOSPITAL_COMMUNITY)
Admission: RE | Admit: 2013-09-11 | Discharge: 2013-09-11 | Disposition: A | Discharge status: Home / Self Care | Payer: 59 | Source: Ambulatory Visit | Attending: Obstetrics and Gynecology | Admitting: Obstetrics and Gynecology

## 2013-09-11 DIAGNOSIS — Z01812 Encounter for preprocedural laboratory examination: Secondary | ICD-10-CM | POA: Insufficient documentation

## 2013-09-11 LAB — CBC
HCT: 34.9 % — ABNORMAL LOW (ref 36.0–46.0)
Hemoglobin: 11.5 g/dL — ABNORMAL LOW (ref 12.0–15.0)
MCH: 27.6 pg (ref 26.0–34.0)
MCHC: 33 g/dL (ref 30.0–36.0)
MCV: 83.9 fL (ref 78.0–100.0)
PLATELETS: 253 10*3/uL (ref 150–400)
RBC: 4.16 MIL/uL (ref 3.87–5.11)
RDW: 13.9 % (ref 11.5–15.5)
WBC: 8.6 10*3/uL (ref 4.0–10.5)

## 2013-09-11 LAB — BASIC METABOLIC PANEL
BUN: 9 mg/dL (ref 6–23)
CALCIUM: 9.1 mg/dL (ref 8.4–10.5)
CO2: 26 mEq/L (ref 19–32)
Chloride: 101 mEq/L (ref 96–112)
Creatinine, Ser: 0.65 mg/dL (ref 0.50–1.10)
GFR calc non Af Amer: >90 mL/min (ref >90)
Glucose, Bld: 101 mg/dL — ABNORMAL HIGH (ref 70–99)
POTASSIUM: 4 meq/L (ref 3.7–5.3)
Sodium: 138 mEq/L (ref 137–147)

## 2013-09-11 LAB — URINE MICROSCOPIC-ADD ON

## 2013-09-11 LAB — URINALYSIS, ROUTINE W REFLEX MICROSCOPIC
BILIRUBIN URINE: NEGATIVE
GLUCOSE, UA: NEGATIVE mg/dL
KETONES UR: NEGATIVE mg/dL
Leukocytes, UA: NEGATIVE
Nitrite: NEGATIVE
PROTEIN: NEGATIVE mg/dL
Specific Gravity, Urine: 1.01 (ref 1.005–1.030)
Urobilinogen, UA: 0.2 mg/dL (ref 0.0–1.0)
pH: 5.5 (ref 5.0–8.0)

## 2013-09-11 LAB — TYPE AND SCREEN
ABO/RH(D): A POS
Antibody Screen: NEGATIVE

## 2013-09-11 LAB — HCG, SERUM, QUALITATIVE: PREG SERUM: NEGATIVE

## 2013-09-11 NOTE — Progress Notes (Signed)
This note was scribed for Valerie Shirk, MD, by Jenne Campus, Medical Scribe on 09/03/13. The information in this note was reviewed by Valerie Shirk, MD, and is accurate.   [facility] Visit  Patient name: Valerie Henderson MRN [id number]  Date of birth: 07/10/1965  CC & HPI:  TRINTY Henderson is a 48 y.o. female presenting today for a f/u for suprapubic pain with menorrhagia during menses. Reports pain is so intense that she passes out and cramping is starting sooner and lasting later each month. Bleeding is "like water" and then "drips" for several days. She is having dyspareunia with a mild amount of spotting afterwards. H/O endometrial polyps treated with endometrial ablation in September 2104 with no improvement. SHG was attempted last visit but was unable to be performed due to cervical stenosis. Pt is s/p bilateral salpingectomy and endometrial ablation. Supracervical hysterectomy surgery is scheduled on May 19th, 2015. Pt given phenetamine last visit for to help with weight loss prior to surgery. Reports a 10 lb weight loss.   Transvaginal US done on 08/06/13  Anteverted uterus noted with asymmetrical endometrial cavity noted, fundal area of thickening noted within endom. Cavity ?polyp with +Doppler flow noted (SHG attempted but Dr. Glo Herring was unable to insert catheter into cx due to stenosis)   ROS:  +menorrhagia  +suprapubic pain +dysparuenia due to uterine contact, starting to avoid it due to discomfort  10 lb weight loss on phenetamine since last visit No complaints   Pertinent History Reviewed:  Medical & Surgical Hx:  Reviewed: Significant for s/p bilateral salpingectomy, endometrial ablation and cholecystectomy  Medications: Reviewed & Updated - see associated section Social History: Reviewed -  reports that she has quit smoking. She has never used smokeless tobacco.  Objective Findings:  Vitals: BP 118/82  Ht 5' (1.524 m)  Wt 199 lb (90.266 kg)  BMI 38.86 kg/m2  LMP  08/15/2013 Chaperone present for exam which was performed with pt's permission Physical Examination: General appearance: alert and cooperative Abdomen: soft, non-tender; bowel sounds normal; no masses,  no organomegaly Pelvic: external genitalia normal and positive findings: cervical stenosis, mid positioned uterus that does not move up  Assessment & Plan:  A: 1. Menorrhagia   2. Cervical stenosis 3. Dyspareunia due to uterine contact  P: 1. Hysteroscopy scheduled on 09/16/13  2. Speak with insurer tomorrow  3. F/u by phone in 2 days. We will call. 4. Supracervical hysterectomy if hysteroscopy fails to improve symptoms     *note recreated due to improper sharing

## 2013-09-11 NOTE — Patient Instructions (Signed)
Valerie Henderson  09/11/2013   Your procedure is scheduled on:  09/16/13  Report to [facility] at 09:00 AM.  Call this number if you have problems the morning of surgery: [phone]   Remember:   Do not eat food or drink liquids after midnight.   Take these medicines the morning of surgery with A SIP OF WATER: Levothyroxine and Gabapentin. Use your Symbicort and Albuterol inhaler the morning of surgery also.   Do not wear jewelry, make-up or nail polish.  Do not wear lotions, powders, or perfumes. You may wear deodorant.  Do not shave 48 hours prior to surgery. Men may shave face and neck.  Do not bring valuables to the hospital.  [facility] is not responsible for any belongings or valuables.               Contacts, dentures or bridgework may not be worn into surgery.  Leave suitcase in the car. After surgery it may be brought to your room.  For patients admitted to the hospital, discharge time is determined by your treatment team.               Patients discharged the day of surgery will not be allowed to drive home.    Special Instructions: Shower using CHG 1 night before surgery and the morning of surgery. Use special wash - you have one bottle of CHG for both showers.  You should use approximately 1/2 of the bottle for each shower.   Please read over the following fact sheets that you were given: Anesthesia Post-op Instructions and Care and Recovery After Surgery    Hysterectomy Information A hysterectomy is a surgery to remove your uterus. After surgery, you will no longer have periods. Also, you will not be able to get pregnant.  REASONS FOR THIS SURGERY  You have bleeding that is not normal and keeps coming back.  You have lasting (chronic) lower belly (pelvic) pain.  You have a lasting infection.  The lining of your uterus grows outside your uterus.  The lining of your uterus grows in the muscle of your uterus.  Your uterus falls down into your vagina.  You have a  growth in your uterus that causes problems.  You have cells that could turn into cancer (precancerous cells).  You have cancer of the uterus or cervix. TYPES  There are 3 types of hysterectomies. Depending on the type, the surgery will:  Remove the top part of the uterus only.  Remove the uterus and the cervix.  Remove the uterus, cervix, and tissue that holds the uterus in place in the lower belly. WAYS A HYSTERECTOMY CAN BE PERFORMED There are 5 ways this surgery can be performed.   A cut (incision) is made in the belly (abdomen). The uterus is taken out through the cut.  A cut is made in the vagina. The uterus is taken out through the cut.  Three or four cuts are made in the belly. A surgical device with a camera is put through one of the cuts. The uterus is cut into small pieces. The uterus is taken out through the cuts or the vagina.  Three or four cuts are made in the belly. A surgical device with a camera is put through one of the cuts. The uterus is taken out through the vagina.  Three or four cuts are made in the belly. A surgical device that is controlled by a computer makes a visual image. The device helps  the surgeon control the surgical tools. The uterus is cut into small pieces. The pieces are taken out through the cuts or through the vagina. WHAT TO EXPECT AFTER THE SURGERY  You will be given pain medicine.  You will need help at home for 3 5 days after surgery.  You will need to see your doctor in 2 4 weeks after surgery.  You may get hot flashes, have night sweats, and have trouble sleeping.  You may need to have Pap tests in the future if your surgery was related to cancer. Talk to your doctor. It is still good to have regular exams. Document Released: 07/10/2011 Document Revised: 02/05/2013 Document Reviewed: 12/23/2012 [facility] Patient Information 2014 [place].   Hysterectomy Care After Refer to this sheet in the next few weeks. These instructions  provide you with information on caring for yourself after your procedure. Your caregiver may also give you more specific instructions. Your treatment has been planned according to current medical practices, but problems sometimes occur. Call your caregiver if you have any problems or questions after your procedure. HOME CARE INSTRUCTIONS  Healing will take time. You may have discomfort, tenderness, swelling, and bruising at the surgical site for about 2 weeks. This is normal and will get better as time goes on.  Only take over-the-counter or prescription medicines for pain, discomfort, or fever as directed by your caregiver.  Do not take aspirin. It can cause bleeding.  Do not drive when taking pain medicine.  Follow your caregiver's advice regarding exercise, lifting, driving, and general activities.  Resume your usual diet as directed and allowed.  Get plenty of rest and sleep.  Do not douche, use tampons, or have sexual intercourse for at least 6 weeks or until your caregiver gives you permission.  Change your bandages (dressings) as directed by your caregiver.  Monitor your temperature.  Take showers instead of baths for 2 to 3 weeks.  Do not drink alcohol until your caregiver gives you permission.  If you are constipated, you may take a mild laxative with your caregiver's permission. Bran foods may help with constipation problems. Drinking enough fluids to keep your urine clear or pale yellow may help as well.  Try to have someone home with you for 1 or 2 weeks to help around the house.  Keep all of your follow-up appointments as directed by your caregiver. SEEK MEDICAL CARE IF:   You have swelling, redness, or increasing pain in the surgical cut (incision) area.  You have pus coming from the incision.  You notice a bad smell coming from the incision or dressing.  You have swelling, redness, or pain around the intravenous (IV) site.  Your incision breaks open.  You  feel dizzy or lightheaded.  You have pain or bleeding when you urinate.  You have persistent diarrhea.  You have persistent nausea and vomiting.  You have abnormal vaginal discharge.  You have a rash.  You have any type of abnormal reaction or develop an allergy to your medicine.  Your pain is not controlled with your prescribed medicine. SEEK IMMEDIATE MEDICAL CARE IF:   You have a fever.  You have severe abdominal pain.  You have chest pain.  You have shortness of breath.  You faint.  You have pain, swelling, or redness of your leg.  You have heavy vaginal bleeding with blood clots. MAKE SURE YOU:  Understand these instructions.  Will watch your condition.  Will get help right away if you are  not doing well or get worse. Document Released: 11/04/2004 Document Revised: 07/10/2011 Document Reviewed: 12/02/2010 [facility] Patient Information 2014 [place].    PATIENT INSTRUCTIONS POST-ANESTHESIA  IMMEDIATELY FOLLOWING SURGERY:  Do not drive or operate machinery for the first twenty four hours after surgery.  Do not make any important decisions for twenty four hours after surgery or while taking narcotic pain medications or sedatives.  If you develop intractable nausea and vomiting or a severe headache please notify your doctor immediately.  FOLLOW-UP:  Please make an appointment with your surgeon as instructed. You do not need to follow up with anesthesia unless specifically instructed to do so.  WOUND CARE INSTRUCTIONS (if applicable):  Keep a dry clean dressing on the anesthesia/puncture wound site if there is drainage.  Once the wound has quit draining you may leave it open to air.  Generally you should leave the bandage intact for twenty four hours unless there is drainage.  If the epidural site drains for more than 36-48 hours please call the anesthesia department.  QUESTIONS?:  Please feel free to call your physician or the hospital operator if you have  any questions, and they will be happy to assist you.

## 2013-09-16 ENCOUNTER — Encounter (HOSPITAL_COMMUNITY): Payer: 59 | Admitting: Anesthesiology

## 2013-09-16 ENCOUNTER — Encounter (HOSPITAL_COMMUNITY): Payer: Self-pay | Admitting: *Deleted

## 2013-09-16 ENCOUNTER — Inpatient Hospital Stay (HOSPITAL_COMMUNITY): Payer: 59 | Admitting: Anesthesiology

## 2013-09-16 ENCOUNTER — Inpatient Hospital Stay (HOSPITAL_COMMUNITY)
Admission: RE | Admit: 2013-09-16 | Discharge: 2013-09-17 | DRG: 743 | Disposition: A | Discharge status: Home / Self Care | Payer: 59 | Source: Ambulatory Visit | Attending: Obstetrics and Gynecology | Admitting: Obstetrics and Gynecology

## 2013-09-16 ENCOUNTER — Encounter (HOSPITAL_COMMUNITY)
Admission: RE | Disposition: A | Discharge status: Home / Self Care | Payer: Self-pay | Source: Ambulatory Visit | Attending: Obstetrics and Gynecology

## 2013-09-16 DIAGNOSIS — N801 Endometriosis of ovary: Principal | ICD-10-CM | POA: Diagnosis present

## 2013-09-16 DIAGNOSIS — N882 Stricture and stenosis of cervix uteri: Secondary | ICD-10-CM

## 2013-09-16 DIAGNOSIS — N8000 Endometriosis of the uterus, unspecified: Secondary | ICD-10-CM

## 2013-09-16 DIAGNOSIS — N946 Dysmenorrhea, unspecified: Secondary | ICD-10-CM | POA: Diagnosis present

## 2013-09-16 DIAGNOSIS — N803 Endometriosis of pelvic peritoneum: Secondary | ICD-10-CM | POA: Diagnosis not present

## 2013-09-16 DIAGNOSIS — D251 Intramural leiomyoma of uterus: Secondary | ICD-10-CM

## 2013-09-16 DIAGNOSIS — N80109 Endometriosis of ovary, unspecified side, unspecified depth: Principal | ICD-10-CM | POA: Diagnosis present

## 2013-09-16 DIAGNOSIS — M4802 Spinal stenosis, cervical region: Secondary | ICD-10-CM | POA: Diagnosis present

## 2013-09-16 DIAGNOSIS — Z87891 Personal history of nicotine dependence: Secondary | ICD-10-CM

## 2013-09-16 DIAGNOSIS — D252 Subserosal leiomyoma of uterus: Secondary | ICD-10-CM

## 2013-09-16 DIAGNOSIS — D259 Leiomyoma of uterus, unspecified: Secondary | ICD-10-CM | POA: Diagnosis present

## 2013-09-16 DIAGNOSIS — IMO0002 Reserved for concepts with insufficient information to code with codable children: Secondary | ICD-10-CM | POA: Diagnosis present

## 2013-09-16 DIAGNOSIS — Z90710 Acquired absence of both cervix and uterus: Secondary | ICD-10-CM

## 2013-09-16 DIAGNOSIS — N920 Excessive and frequent menstruation with regular cycle: Secondary | ICD-10-CM | POA: Diagnosis present

## 2013-09-16 DIAGNOSIS — Z23 Encounter for immunization: Secondary | ICD-10-CM

## 2013-09-16 HISTORY — PX: SUPRACERVICAL ABDOMINAL HYSTERECTOMY: SHX5393

## 2013-09-16 HISTORY — PX: OOPHORECTOMY: SHX6387

## 2013-09-16 SURGERY — HYSTERECTOMY, SUPRACERVICAL, ABDOMINAL
Anesthesia: General | Site: Abdomen

## 2013-09-16 MED ORDER — GLYCOPYRROLATE 0.2 MG/ML IJ SOLN
INTRAMUSCULAR | Status: DC | PRN
  Administered 2013-09-16: 0.6 mg via INTRAVENOUS

## 2013-09-16 MED ORDER — GABAPENTIN 300 MG PO CAPS
300.0000 mg | ORAL_CAPSULE | Freq: Three times a day (TID) | ORAL | Status: DC
  Administered 2013-09-16 – 2013-09-17 (×4): 300 mg via ORAL
  Filled 2013-09-16 (×4): qty 1

## 2013-09-16 MED ORDER — FENTANYL CITRATE 0.05 MG/ML IJ SOLN
INTRAMUSCULAR | Status: AC
  Filled 2013-09-16: qty 2

## 2013-09-16 MED ORDER — LORATADINE 10 MG PO TABS
10.0000 mg | ORAL_TABLET | Freq: Every day | ORAL | Status: DC
  Administered 2013-09-16 – 2013-09-17 (×2): 10 mg via ORAL
  Filled 2013-09-16 (×2): qty 1

## 2013-09-16 MED ORDER — KETOROLAC TROMETHAMINE 30 MG/ML IJ SOLN
30.0000 mg | Freq: Four times a day (QID) | INTRAMUSCULAR | Status: DC
  Filled 2013-09-16: qty 1

## 2013-09-16 MED ORDER — GLYCOPYRROLATE 0.2 MG/ML IJ SOLN
INTRAMUSCULAR | Status: AC
  Filled 2013-09-16: qty 3

## 2013-09-16 MED ORDER — DIPHENHYDRAMINE HCL 50 MG/ML IJ SOLN: 12.5000 mg | Freq: Four times a day (QID) | INTRAMUSCULAR | Status: DC | PRN

## 2013-09-16 MED ORDER — ONDANSETRON HCL 4 MG/2ML IJ SOLN
4.0000 mg | Freq: Once | INTRAMUSCULAR | Status: AC | PRN
  Administered 2013-09-16: 4 mg via INTRAVENOUS

## 2013-09-16 MED ORDER — BUPIVACAINE LIPOSOME 1.3 % IJ SUSP
20.0000 mL | Freq: Once | INTRAMUSCULAR | Status: AC
  Administered 2013-09-16: 40 mL
  Filled 2013-09-16: qty 20

## 2013-09-16 MED ORDER — ZOLPIDEM TARTRATE 5 MG PO TABS: 5.0000 mg | ORAL_TABLET | Freq: Every evening | ORAL | Status: DC | PRN

## 2013-09-16 MED ORDER — MIDAZOLAM HCL 2 MG/2ML IJ SOLN
1.0000 mg | INTRAMUSCULAR | Status: DC | PRN
  Administered 2013-09-16 (×2): 2 mg via INTRAVENOUS
  Filled 2013-09-16: qty 2

## 2013-09-16 MED ORDER — ONDANSETRON HCL 4 MG/2ML IJ SOLN
4.0000 mg | Freq: Four times a day (QID) | INTRAMUSCULAR | Status: DC | PRN
  Administered 2013-09-16: 4 mg via INTRAVENOUS
  Filled 2013-09-16: qty 2

## 2013-09-16 MED ORDER — NALOXONE HCL 0.4 MG/ML IJ SOLN: 0.4000 mg | INTRAMUSCULAR | Status: DC | PRN

## 2013-09-16 MED ORDER — CEFAZOLIN SODIUM-DEXTROSE 2-3 GM-% IV SOLR
2.0000 g | INTRAVENOUS | Status: AC
  Administered 2013-09-16: 2 g via INTRAVENOUS
  Filled 2013-09-16: qty 50

## 2013-09-16 MED ORDER — ONDANSETRON HCL 4 MG PO TABS: 4.0000 mg | ORAL_TABLET | Freq: Four times a day (QID) | ORAL | Status: DC | PRN

## 2013-09-16 MED ORDER — OXYCODONE-ACETAMINOPHEN 5-325 MG PO TABS: 1.0000 | ORAL_TABLET | ORAL | Status: DC | PRN

## 2013-09-16 MED ORDER — ROCURONIUM BROMIDE 100 MG/10ML IV SOLN
INTRAVENOUS | Status: DC | PRN
  Administered 2013-09-16: 50 mg via INTRAVENOUS
  Administered 2013-09-16: 10 mg via INTRAVENOUS

## 2013-09-16 MED ORDER — LINACLOTIDE 145 MCG PO CAPS
145.0000 ug | ORAL_CAPSULE | Freq: Every day | ORAL | Status: DC
  Filled 2013-09-16 (×3): qty 1

## 2013-09-16 MED ORDER — MIDAZOLAM HCL 2 MG/2ML IJ SOLN
INTRAMUSCULAR | Status: AC
  Filled 2013-09-16: qty 2

## 2013-09-16 MED ORDER — DEXAMETHASONE SODIUM PHOSPHATE 4 MG/ML IJ SOLN
INTRAMUSCULAR | Status: AC
  Filled 2013-09-16: qty 1

## 2013-09-16 MED ORDER — SODIUM CHLORIDE 0.9 % IJ SOLN: 9.0000 mL | INTRAMUSCULAR | Status: DC | PRN

## 2013-09-16 MED ORDER — FENTANYL CITRATE 0.05 MG/ML IJ SOLN
25.0000 ug | INTRAMUSCULAR | Status: DC | PRN
  Administered 2013-09-16 (×3): 50 ug via INTRAVENOUS

## 2013-09-16 MED ORDER — SCOPOLAMINE 1 MG/3DAYS TD PT72
1.0000 | MEDICATED_PATCH | Freq: Once | TRANSDERMAL | Status: DC
  Administered 2013-09-16: 1.5 mg via TRANSDERMAL

## 2013-09-16 MED ORDER — ONDANSETRON HCL 4 MG/2ML IJ SOLN
4.0000 mg | Freq: Once | INTRAMUSCULAR | Status: AC
  Administered 2013-09-16: 4 mg via INTRAVENOUS

## 2013-09-16 MED ORDER — ROCURONIUM BROMIDE 50 MG/5ML IV SOLN
INTRAVENOUS | Status: AC
  Filled 2013-09-16: qty 1

## 2013-09-16 MED ORDER — LIDOCAINE HCL (PF) 1 % IJ SOLN
INTRAMUSCULAR | Status: AC
  Filled 2013-09-16: qty 5

## 2013-09-16 MED ORDER — ONDANSETRON HCL 4 MG/2ML IJ SOLN
INTRAMUSCULAR | Status: AC
  Filled 2013-09-16: qty 2

## 2013-09-16 MED ORDER — LEVOTHYROXINE SODIUM 100 MCG PO TABS
100.0000 ug | ORAL_TABLET | Freq: Every day | ORAL | Status: DC
  Administered 2013-09-17: 100 ug via ORAL
  Filled 2013-09-16: qty 1

## 2013-09-16 MED ORDER — NEOSTIGMINE METHYLSULFATE 10 MG/10ML IV SOLN
INTRAVENOUS | Status: AC
  Filled 2013-09-16: qty 1

## 2013-09-16 MED ORDER — NEOSTIGMINE METHYLSULFATE 10 MG/10ML IV SOLN
INTRAVENOUS | Status: DC | PRN
  Administered 2013-09-16: 4 mg via INTRAVENOUS

## 2013-09-16 MED ORDER — ALBUTEROL SULFATE HFA 108 (90 BASE) MCG/ACT IN AERS: 2.0000 | INHALATION_SPRAY | Freq: Four times a day (QID) | RESPIRATORY_TRACT | Status: DC | PRN

## 2013-09-16 MED ORDER — PNEUMOCOCCAL VAC POLYVALENT 25 MCG/0.5ML IJ INJ
0.5000 mL | INJECTION | INTRAMUSCULAR | Status: AC
  Administered 2013-09-17: 0.5 mL via INTRAMUSCULAR
  Filled 2013-09-16: qty 0.5

## 2013-09-16 MED ORDER — KETOROLAC TROMETHAMINE 30 MG/ML IJ SOLN
30.0000 mg | Freq: Once | INTRAMUSCULAR | Status: AC
  Administered 2013-09-16: 30 mg via INTRAVENOUS
  Filled 2013-09-16: qty 1

## 2013-09-16 MED ORDER — DIPHENHYDRAMINE HCL 12.5 MG/5ML PO ELIX: 12.5000 mg | ORAL_SOLUTION | Freq: Four times a day (QID) | ORAL | Status: DC | PRN

## 2013-09-16 MED ORDER — KETOROLAC TROMETHAMINE 30 MG/ML IJ SOLN
30.0000 mg | Freq: Four times a day (QID) | INTRAMUSCULAR | Status: DC
  Administered 2013-09-16 – 2013-09-17 (×5): 30 mg via INTRAVENOUS
  Filled 2013-09-16 (×4): qty 1

## 2013-09-16 MED ORDER — PANTOPRAZOLE SODIUM 40 MG PO TBEC
40.0000 mg | DELAYED_RELEASE_TABLET | Freq: Every day | ORAL | Status: DC
  Administered 2013-09-17: 40 mg via ORAL
  Filled 2013-09-16: qty 1

## 2013-09-16 MED ORDER — SEVOFLURANE IN SOLN
RESPIRATORY_TRACT | Status: AC
  Filled 2013-09-16: qty 250

## 2013-09-16 MED ORDER — SODIUM CHLORIDE 0.9 % IJ SOLN
INTRAMUSCULAR | Status: AC
  Filled 2013-09-16: qty 20

## 2013-09-16 MED ORDER — SCOPOLAMINE 1 MG/3DAYS TD PT72
MEDICATED_PATCH | TRANSDERMAL | Status: AC
  Filled 2013-09-16: qty 1

## 2013-09-16 MED ORDER — ALBUTEROL SULFATE (2.5 MG/3ML) 0.083% IN NEBU
2.5000 mg | INHALATION_SOLUTION | Freq: Four times a day (QID) | RESPIRATORY_TRACT | Status: DC | PRN
  Administered 2013-09-17: 2.5 mg via RESPIRATORY_TRACT
  Filled 2013-09-16: qty 3

## 2013-09-16 MED ORDER — ONDANSETRON HCL 4 MG/2ML IJ SOLN: 4.0000 mg | Freq: Four times a day (QID) | INTRAMUSCULAR | Status: DC | PRN

## 2013-09-16 MED ORDER — MIDAZOLAM HCL 5 MG/5ML IJ SOLN
INTRAMUSCULAR | Status: DC | PRN
  Administered 2013-09-16: 2 mg via INTRAVENOUS

## 2013-09-16 MED ORDER — PROPOFOL 10 MG/ML IV BOLUS
INTRAVENOUS | Status: DC | PRN
  Administered 2013-09-16: 150 mg via INTRAVENOUS

## 2013-09-16 MED ORDER — ALBUTEROL SULFATE HFA 108 (90 BASE) MCG/ACT IN AERS
INHALATION_SPRAY | RESPIRATORY_TRACT | Status: DC | PRN
  Administered 2013-09-16: 2 via RESPIRATORY_TRACT

## 2013-09-16 MED ORDER — DOCUSATE SODIUM 100 MG PO CAPS
100.0000 mg | ORAL_CAPSULE | Freq: Two times a day (BID) | ORAL | Status: DC
  Administered 2013-09-17: 100 mg via ORAL
  Filled 2013-09-16: qty 1

## 2013-09-16 MED ORDER — ALBUTEROL SULFATE HFA 108 (90 BASE) MCG/ACT IN AERS
INHALATION_SPRAY | RESPIRATORY_TRACT | Status: AC
  Filled 2013-09-16: qty 6.7

## 2013-09-16 MED ORDER — HYDROMORPHONE 0.3 MG/ML IV SOLN
INTRAVENOUS | Status: DC
  Administered 2013-09-16: 15:00:00 via INTRAVENOUS
  Administered 2013-09-16: 0.8 mg via INTRAVENOUS
  Filled 2013-09-16: qty 25

## 2013-09-16 MED ORDER — IBUPROFEN 600 MG PO TABS: 600.0000 mg | ORAL_TABLET | Freq: Four times a day (QID) | ORAL | Status: DC | PRN

## 2013-09-16 MED ORDER — FENTANYL CITRATE 0.05 MG/ML IJ SOLN
INTRAMUSCULAR | Status: AC
  Filled 2013-09-16: qty 5

## 2013-09-16 MED ORDER — DEXAMETHASONE SODIUM PHOSPHATE 4 MG/ML IJ SOLN
4.0000 mg | Freq: Once | INTRAMUSCULAR | Status: AC
  Administered 2013-09-16: 4 mg via INTRAVENOUS

## 2013-09-16 MED ORDER — LACTATED RINGERS IV SOLN
INTRAVENOUS | Status: DC
  Administered 2013-09-16 (×2): via INTRAVENOUS
  Administered 2013-09-16: 1000 mL via INTRAVENOUS

## 2013-09-16 MED ORDER — 0.9 % SODIUM CHLORIDE (POUR BTL) OPTIME
TOPICAL | Status: DC | PRN
  Administered 2013-09-16: 2000 mL

## 2013-09-16 MED ORDER — SODIUM CHLORIDE 0.9 % IV SOLN
INTRAVENOUS | Status: DC
  Administered 2013-09-16 (×2): via INTRAVENOUS

## 2013-09-16 MED ORDER — FENTANYL CITRATE 0.05 MG/ML IJ SOLN
INTRAMUSCULAR | Status: DC | PRN
  Administered 2013-09-16 (×4): 50 ug via INTRAVENOUS
  Administered 2013-09-16: 100 ug via INTRAVENOUS
  Administered 2013-09-16 (×3): 50 ug via INTRAVENOUS

## 2013-09-16 MED ORDER — PROPOFOL 10 MG/ML IV EMUL
INTRAVENOUS | Status: AC
  Filled 2013-09-16: qty 20

## 2013-09-16 MED ORDER — LIDOCAINE HCL 1 % IJ SOLN
INTRAMUSCULAR | Status: DC | PRN
  Administered 2013-09-16: 50 mg via INTRADERMAL

## 2013-09-16 SURGICAL SUPPLY — 63 items
APL SKNCLS STERI-STRIP NONHPOA (GAUZE/BANDAGES/DRESSINGS) ×3
APPLIER CLIP 11 MED OPEN (CLIP)
APPLIER CLIP 13 LRG OPEN (CLIP)
APR CLP LRG 13 20 CLIP (CLIP)
APR CLP MED 11 20 MLT OPN (CLIP)
BAG HAMPER (MISCELLANEOUS) ×5 IMPLANT
BENZOIN TINCTURE PRP APPL 2/3 (GAUZE/BANDAGES/DRESSINGS) ×3 IMPLANT
BLADE SURG ROTATE 9660 (MISCELLANEOUS) ×3 IMPLANT
CELLS DAT CNTRL 66122 CELL SVR (MISCELLANEOUS) ×3 IMPLANT
CLIP APPLIE 11 MED OPEN (CLIP) IMPLANT
CLIP APPLIE 13 LRG OPEN (CLIP) IMPLANT
CLOSURE WOUND 1/2 X4 (GAUZE/BANDAGES/DRESSINGS) ×2
CLOTH BEACON ORANGE TIMEOUT ST (SAFETY) ×5 IMPLANT
COVER LIGHT HANDLE STERIS (MISCELLANEOUS) ×10 IMPLANT
DRAPE WARM FLUID 44X44 (DRAPE) ×5 IMPLANT
DURAPREP 26ML APPLICATOR (WOUND CARE) ×5 IMPLANT
ELECT REM PT RETURN 9FT ADLT (ELECTROSURGICAL) ×5
ELECTRODE REM PT RTRN 9FT ADLT (ELECTROSURGICAL) ×3 IMPLANT
EVACUATOR DRAINAGE 10X20 100CC (DRAIN) IMPLANT
EVACUATOR SILICONE 100CC (DRAIN)
FORMALIN 10 PREFIL 480ML (MISCELLANEOUS) ×5 IMPLANT
GLOVE BIOGEL PI IND STRL 9 (GLOVE) ×3 IMPLANT
GLOVE BIOGEL PI INDICATOR 9 (GLOVE) ×2
GLOVE ECLIPSE 9.0 STRL (GLOVE) ×5 IMPLANT
GOWN SPEC L3 XXLG W/TWL (GOWN DISPOSABLE) ×10 IMPLANT
GOWN STRL REUS W/TWL LRG LVL3 (GOWN DISPOSABLE) ×10 IMPLANT
INST SET MAJOR GENERAL (KITS) ×5 IMPLANT
KIT ROOM TURNOVER APOR (KITS) ×5 IMPLANT
MANIFOLD NEPTUNE II (INSTRUMENTS) ×5 IMPLANT
NDL HYPO 18GX1.5 BLUNT FILL (NEEDLE) IMPLANT
NDL HYPO 25X1 1.5 SAFETY (NEEDLE) IMPLANT
NDL SAFETY ECLIPSE 18X1.5 (NEEDLE) ×1 IMPLANT
NEEDLE HYPO 18GX1.5 BLUNT FILL (NEEDLE) ×5 IMPLANT
NEEDLE HYPO 18GX1.5 SHARP (NEEDLE) ×5
NEEDLE HYPO 25X1 1.5 SAFETY (NEEDLE) ×5 IMPLANT
NS IRRIG 1000ML POUR BTL (IV SOLUTION) ×10 IMPLANT
PACK ABDOMINAL MAJOR (CUSTOM PROCEDURE TRAY) ×5 IMPLANT
RETRACTOR WND ALEXIS 18 MED (MISCELLANEOUS) IMPLANT
RETRACTOR WND ALEXIS 25 LRG (MISCELLANEOUS) IMPLANT
RTRCTR WOUND ALEXIS 18CM MED (MISCELLANEOUS) ×5
RTRCTR WOUND ALEXIS 25CM LRG (MISCELLANEOUS)
SET BASIN LINEN APH (SET/KITS/TRAYS/PACK) ×5 IMPLANT
SPONGE DRAIN TRACH 4X4 STRL 2S (GAUZE/BANDAGES/DRESSINGS) IMPLANT
SPONGE GAUZE 4X4 12PLY (GAUZE/BANDAGES/DRESSINGS) ×8 IMPLANT
SPONGE LAP 18X18 X RAY DECT (DISPOSABLE) IMPLANT
STRIP CLOSURE SKIN 1/2X4 (GAUZE/BANDAGES/DRESSINGS) ×8 IMPLANT
SUT CHROMIC 0 CT 1 (SUTURE) ×40 IMPLANT
SUT CHROMIC 2 0 CT 1 (SUTURE) ×5 IMPLANT
SUT CHROMIC GUT BROWN 0 54 (SUTURE) IMPLANT
SUT CHROMIC GUT BROWN 0 54IN (SUTURE)
SUT ETHILON 3 0 FSL (SUTURE) IMPLANT
SUT PDS AB CT VIOLET #0 27IN (SUTURE) IMPLANT
SUT PLAIN CT 1/2CIR 2-0 27IN (SUTURE) ×5 IMPLANT
SUT PROLENE 0 CT 1 30 (SUTURE) IMPLANT
SUT VIC AB 0 CT1 27 (SUTURE)
SUT VIC AB 0 CT1 27XBRD ANTBC (SUTURE) IMPLANT
SUT VIC AB 2-0 CT1 27 (SUTURE)
SUT VIC AB 2-0 CT1 TAPERPNT 27 (SUTURE) IMPLANT
SUT VICRYL 4 0 KS 27 (SUTURE) ×5 IMPLANT
SYR 20CC LL (SYRINGE) ×6 IMPLANT
TAPE CLOTH SURG 4X10 WHT LF (GAUZE/BANDAGES/DRESSINGS) ×3 IMPLANT
TOWEL BLUE STERILE X RAY DET (MISCELLANEOUS) ×5 IMPLANT
TRAY FOLEY CATH 16FR SILVER (SET/KITS/TRAYS/PACK) ×5 IMPLANT

## 2013-09-16 NOTE — Transfer of Care (Signed)
Immediate Anesthesia Transfer of Care Note  Patient: Valerie Henderson  Procedure(s) Performed: Procedure(s): HYSTERECTOMY SUPRACERVICAL ABDOMINAL (N/A) OOPHORECTOMY (Bilateral)  Patient Location: PACU  Anesthesia Type:General  Level of Consciousness: sedated  Airway & Oxygen Therapy: Patient Spontanous Breathing, Patient connected to nasal cannula oxygen and Patient connected to T-piece oxygen  Post-op Assessment: Report given to PACU RN, Post -op Vital signs reviewed and stable and Patient moving all extremities  Post vital signs: Reviewed and stable  Complications: No apparent anesthesia complications

## 2013-09-16 NOTE — Progress Notes (Signed)
Dr Patsey Berthold at bedside to check pt. No new orders given. Cleared for d/c to room.

## 2013-09-16 NOTE — Anesthesia Postprocedure Evaluation (Signed)
  Anesthesia Post-op Note  Patient: Valerie Henderson  Procedure(s) Performed: Procedure(s): HYSTERECTOMY SUPRACERVICAL ABDOMINAL (N/A) OOPHORECTOMY (Bilateral)  Patient Location: PACU  Anesthesia Type:General  Level of Consciousness: awake, alert , oriented and patient cooperative  Airway and Oxygen Therapy: Patient Spontanous Breathing and Patient connected to nasal cannula oxygen  Post-op Pain: 4 /10, moderate  Post-op Assessment: Post-op Vital signs reviewed, Patient's Cardiovascular Status Stable, Respiratory Function Stable, Patent Airway, No signs of Nausea or vomiting and Pain level controlled  Post-op Vital Signs: Reviewed and stable  Last Vitals:  Filed Vitals:   09/16/13 1045  BP: 131/80  Temp:   Resp: 22    Complications: No apparent anesthesia complications

## 2013-09-16 NOTE — Anesthesia Procedure Notes (Signed)
Procedure Name: Intubation Date/Time: 09/16/2013 10:57 AM Performed by: Charmaine Downs Pre-anesthesia Checklist: Patient being monitored, Suction available, Emergency Drugs available and Patient identified Patient Re-evaluated:Patient Re-evaluated prior to inductionOxygen Delivery Method: Circle system utilized Preoxygenation: Pre-oxygenation with 100% oxygen Intubation Type: IV induction Ventilation: Mask ventilation without difficulty and Oral airway inserted - appropriate to patient size Laryngoscope Size: Mac and 3 Grade View: Grade I Tube type: Oral Tube size: 7.0 mm Number of attempts: 1 Airway Equipment and Method: Stylet Placement Confirmation: ETT inserted through vocal cords under direct vision,  positive ETCO2 and breath sounds checked- equal and bilateral Secured at: 22 cm Tube secured with: Tape Dental Injury: Teeth and Oropharynx as per pre-operative assessment

## 2013-09-16 NOTE — Brief Op Note (Addendum)
09/16/2013  1:45 PM  PATIENT:  Valerie Henderson  48 y.o. female  PRE-OPERATIVE DIAGNOSIS:  CERVICAL STENOSIS DYSMENORRHEA  POST-OPERATIVE DIAGNOSIS:  CERVICAL STENOSIS DYSMENORRHEA,endometriosis  PROCEDURE:  Procedure(s): HYSTERECTOMY SUPRACERVICAL ABDOMINAL (N/A) OOPHORECTOMY (Bilateral)  SURGEON:  Surgeon(s) and Role:    * Jonnie Kind, MD - Primary  PHYSICIAN ASSISTANT:   ASSISTANTS: Lynnell Dike, RN -- FA   ANESTHESIA:   local and general  EBL:  Total I/O In: 2300 [I.V.:2300] Out: 450 [Urine:300; Blood:150]  BLOOD ADMINISTERED:none  DRAINS: Urinary Catheter (Foley)   LOCAL MEDICATIONS USED:  OTHER Exparel, 20 cc diluted to 40 cc  SPECIMEN:  Source of Specimen:  Uterus, supracervical, with both ovaries  DISPOSITION OF SPECIMEN:  PATHOLOGY  COUNTS:  YES  TOURNIQUET:  * No tourniquets in log *  DICTATION: .Dragon Dictation  PLAN OF CARE: Admit to inpatient   PATIENT DISPOSITION:  PACU - hemodynamically stable.   Delay start of Pharmacological VTE agent (>24hrs) due to surgical blood loss or risk of bleeding: not applicable

## 2013-09-16 NOTE — H&P (Addendum)
  [facility] Visit   Patient name: Valerie Henderson MRN [id number] Date of birth: [birth year]  CC & HPI:   Valerie Henderson is a 48 y.o. female presenting today for a f/u for suprapubic pain with menorrhagia during menses. Reports pain is so intense that she passes out and cramping is starting sooner and lasting later each month. Bleeding is "like water" and then "drips" for several days. She is having dyspareunia with a mild amount of spotting afterwards. H/O endometrial polyps treated with endometrial ablation in September 2104 with no improvement. SHG was attempted last visit but was unable to be performed due to cervical stenosis. Pt is s/p bilateral salpingectomy and endometrial ablation. Abdonimal hysterectomy surgery is scheduled on May 19th, 2015. Pt given phenetamine last visit for to help with weight loss prior to surgery. Reports a 10 lb weight loss.  Transvaginal US done on 08/06/13  Anteverted uterus noted with asymmetrical endometrial cavity noted, fundal area of thickening noted within endom. Cavity ?polyp with +Doppler flow noted (SHG attempted but Dr. Glo Herring was unable to insert catheter into cx due to stenosis)  ROS:   +menorrhagia  +suprapubic pain  +dysparuenia due to uterine contact, starting to avoid it due to discomfort  10 lb weight loss on phenetamine since last visit  No complaints  Author Note Status Last Update User Last Update Date/Time   Jonnie Kind, MD Signed Jonnie Kind, MD 09/09/2013 10:32 AM             A telephone call to patient made to get full information regarding patient's dyspareunia complaints , in order to give full records to [employer].  Patient describes her complaints of dyspareunia, as being primarily deep penetration dyspareunia, " like he's hitting something", and that pain does not resolve with continued intimacy.  We discussed the pro & con of cervix preservation given this type of dyspareunia, and I have a concern that while  vaginal lubrication may be improved with cervix preservation, there is some potential that cervix preservation could result in some residual dyspareunia from cervix contact even after hysterectomy.  After this discussion, we have agreed that if hysterectomy is to be done, that removal of cervix is preferable.  Ovarian preservation is desirable and planned for any surgical procedure.        Pertinent History Reviewed:   Medical & Surgical Hx: Reviewed: Significant for s/p bilateral salpingectomy, endometrial ablation and cholecystectomy  Medications: Reviewed & Updated - see associated section  Social History: Reviewed - reports that she has quit smoking. She has never used smokeless tobacco.  Objective Findings:   Vitals: BP 118/82  Ht 5' (1.524 m)  Wt 199 lb (90.266 kg)  BMI 38.86 kg/m2  LMP 08/15/2013  Chaperone present for exam which was performed with pt's permission  Physical Examination:  General appearance: alert and cooperative  Abdomen: soft, non-tender; bowel sounds normal; no masses, no organomegaly  Pelvic: external genitalia normal and positive findings: cervical stenosis, mid positioned uterus that does not move up  Assessment & Plan:   A:  1. Menorrhagia  2. Cervical stenosis  3. Dyspareunia due to uterine contact  P:  1 abdominal hysterectomy, with removal of cervix, Fallopian tubes have already been removed.Marland Kitchen

## 2013-09-16 NOTE — Progress Notes (Signed)
UR chart review completed.  

## 2013-09-16 NOTE — Progress Notes (Signed)
Awakens easily to name. resp adequate/nonlabored. Cardiac monitor displays sinus arrythmia. Strip documented. BP 152/75. O2 continues.

## 2013-09-16 NOTE — Progress Notes (Signed)
Dr Patsey Berthold notified of pt condition. No new orders given.

## 2013-09-16 NOTE — Op Note (Signed)
09/16/2013  1:45 PM  PATIENT:  Valerie Henderson  48 y.o. female  PRE-OPERATIVE DIAGNOSIS:  CERVICAL STENOSIS DYSMENORRHEA  POST-OPERATIVE DIAGNOSIS:  CERVICAL STENOSIS DYSMENORRHEA,endometriosis  PROCEDURE:  Procedure(s): HYSTERECTOMY SUPRACERVICAL ABDOMINAL (N/A) OOPHORECTOMY (Bilateral)  SURGEON:  Surgeon(s) and Role:    * Jonnie Kind, MD - Primary  PHYSICIAN ASSISTANT:   ASSISTANTS: Lynnell Dike, RN -- FA   ANESTHESIA:   local and general  EBL:  Total I/O In: 2300 [I.V.:2300] Out: 450 [Urine:300; Blood:150]  Details of procedure: Patient was taken operating room prepped and draped for lower abdominal surgery with Pfannenstiel incision planned. Foley catheter was in place. Timeout was conducted and antibiotics administered and procedure confirmed Pfannenstiel type lower abdominal incision was performed with excision of a 5 cm ellipse of skin and fatty tissue to allow for improved access to the pelvis incision was approximately 20 cm in length. Fascia was opened in the standard Pfannenstiel opening and peritoneal cavity entered without difficulty. The bowel was packed away using 3 moist laparotomy tapes and a radiographic towel, then attention directed the uterus. The uterus had small fibroids on its surface and had a hard nodule on its anterior surface just below and in front of the left round ligament that appeared to be in an endometriosis implant there were significant adhesions of the sigmoid colon overlying the left infundibulopelvic ligament. The round ligaments could be clamped cut and transected with 0 chromic suture ligature. The left utero-ovarian ligament could be isolated clamped cut and suture ligated. The right side was treated similarly. Bladder flap was developed anteriorly. The upper cardinal ligaments were clamped cut and suture ligated on each side. At this time you could notice that the right uterosacral ligament was firm and thickened. Further palpation  indicated that there was a firm fibrotic area on the lateral aspects of the right utero-sacral ligaments were densely bound up in what appeared to be in large endometriosis implant. The ureter could be palpated on the right side by the banjo string technique and appeared to go directly into the area of the endometriosis implant. The cyst body of the uterus could be rotated off earlier uterine segment at this time, and it was excised using a conical coring technique that allowed the remnant of the cervix to be closed front to back with a series of interrupted 0 chromic sutures. With improved visibility, additional effort was made to dissect the right retroperitoneal structures. The ureter was accompanied by a large number of veins and appeared to go directly toward the endometriosis implant. It was felt that hurts to dissect this out and meet with significant risk to the ureter so decision to leave this in situ was made. Then after consideration was felt that ovarian removal was necessary to promote atrophy of the endometriosis implant. The right ovary could be easily visualized and careful inspection showed that the infundibulopelvic ligament was free and clear from the ureter and it was cross clamped very close to the ovary the ovary amputated and the pedicle tied. Attention was then directed left side with plans to remove the left ovary as well. On this side of the bowel was mobilized off of the left IP ligament and revealed an additional 1.5 cm firm fibrotic endometriotic implant on the pelvic brim on the outer surface of the infundibulopelvic ligament. Retroperitoneal dissection was performed on this side to try to identify the ureter. At one time we felt we could feel it using banjo string technique deep in the pelvis on  the left but this could not be reproduced.  the ovary itself was quite mobilized and was well off the sidewall. It was felt there was no risk of the ureter with oophorectomy which was then  performed by crossclamping as far distal on the IP ligament as possible and removing just the ovary. The fallopian tubes had been removed previously  Urine output remained appropriately clear and generous throughout the case pelvis was irrigated. The left uterine vessels were oozing slightly and were grasped clamped and oversewn once again with 0 chromic. The remainder of the pelvic floor was considered hemostatic. The appendix was inspected and was normal. Bowel surfaces were normal procedure was considered satisfactorily completed and it was felt at leaving the endometriosis areas in situ was preferable to risk associated with dissection efforts particularly given the Significant oozing tendencies the patient shown during earlier portion the case. Next laparotomy equipment was removed sponge and needle counts correct, anterior peritoneum closed with 2-0 chromic, the fascia closed with 0 Vicryl, subcutaneous tissues irrigated with saline solution then reapproximated with interrupted 20 plain and then subcuticular 4-0 Vicryl used to close the skin incision. Hemostasis was good throughout EBL 150 cc. Condition to recovery room good.

## 2013-09-16 NOTE — Anesthesia Preprocedure Evaluation (Signed)
Anesthesia Evaluation  Patient identified by MRN, date of birth, ID band Patient awake    Reviewed: Allergy & Precautions, H&P , NPO status , Patient's Chart, lab work & pertinent test results  History of Anesthesia Complications (+) PONV and history of anesthetic complications  Airway Mallampati: I TM Distance: >3 FB Neck ROM: Full    Dental  (+) Teeth Intact   Pulmonary asthma , former smoker,  breath sounds clear to auscultation        Cardiovascular negative cardio ROS  Rhythm:Regular Rate:Normal     Neuro/Psych    GI/Hepatic GERD-  Medicated and Controlled,  Endo/Other  Hypothyroidism   Renal/GU      Musculoskeletal  (+) Fibromyalgia -  Abdominal   Peds  Hematology  (+) anemia ,   Anesthesia Other Findings   Reproductive/Obstetrics                           Anesthesia Physical Anesthesia Plan  ASA: II  Anesthesia Plan: General   Post-op Pain Management:    Induction: Intravenous, Rapid sequence and Cricoid pressure planned  Airway Management Planned: Oral ETT  Additional Equipment:   Intra-op Plan:   Post-operative Plan: Extubation in OR  Informed Consent: I have reviewed the patients History and Physical, chart, labs and discussed the procedure including the risks, benefits and alternatives for the proposed anesthesia with the patient or authorized representative who has indicated his/her understanding and acceptance.     Plan Discussed with:   Anesthesia Plan Comments: (Pt used her inhaler this am prior to arrival.)        Anesthesia Quick Evaluation

## 2013-09-17 ENCOUNTER — Encounter (HOSPITAL_COMMUNITY): Payer: Self-pay | Admitting: Obstetrics and Gynecology

## 2013-09-17 LAB — CBC
HCT: 30.6 % — ABNORMAL LOW (ref 36.0–46.0)
HEMOGLOBIN: 9.8 g/dL — AB (ref 12.0–15.0)
MCH: 27.4 pg (ref 26.0–34.0)
MCHC: 32 g/dL (ref 30.0–36.0)
MCV: 85.5 fL (ref 78.0–100.0)
PLATELETS: 238 10*3/uL (ref 150–400)
RBC: 3.58 MIL/uL — AB (ref 3.87–5.11)
RDW: 14.3 % (ref 11.5–15.5)
WBC: 9.7 10*3/uL (ref 4.0–10.5)

## 2013-09-17 LAB — BASIC METABOLIC PANEL
BUN: 6 mg/dL (ref 6–23)
CHLORIDE: 106 meq/L (ref 96–112)
CO2: 25 meq/L (ref 19–32)
Calcium: 7.8 mg/dL — ABNORMAL LOW (ref 8.4–10.5)
Creatinine, Ser: 0.68 mg/dL (ref 0.50–1.10)
GFR calc Af Amer: >90 mL/min (ref >90)
GFR calc non Af Amer: >90 mL/min (ref >90)
GLUCOSE: 112 mg/dL — AB (ref 70–99)
POTASSIUM: 4.2 meq/L (ref 3.7–5.3)
Sodium: 140 mEq/L (ref 137–147)

## 2013-09-17 MED ORDER — ONDANSETRON HCL 4 MG PO TABS: 4.0000 mg | ORAL_TABLET | Freq: Four times a day (QID) | ORAL | Status: DC | PRN

## 2013-09-17 MED ORDER — OXYCODONE-ACETAMINOPHEN 5-325 MG PO TABS: 1.0000 | ORAL_TABLET | ORAL | Status: DC | PRN

## 2013-09-17 MED ORDER — KETOROLAC TROMETHAMINE 10 MG PO TABS: 10.0000 mg | ORAL_TABLET | Freq: Three times a day (TID) | ORAL | Status: DC | PRN

## 2013-09-17 NOTE — Anesthesia Postprocedure Evaluation (Signed)
  Anesthesia Post-op Note  Patient: Valerie Henderson  Procedure(s) Performed: Procedure(s): HYSTERECTOMY SUPRACERVICAL ABDOMINAL (N/A) OOPHORECTOMY (Bilateral)  Patient Location: Nursing Unit  Anesthesia Type:General  Level of Consciousness: awake and patient cooperative  Airway and Oxygen Therapy: Patient Spontanous Breathing  Post-op Pain: mild  Post-op Assessment: Post-op Vital signs reviewed, Patient's Cardiovascular Status Stable, Respiratory Function Stable, No signs of Nausea or vomiting, Adequate PO intake and Pain level controlled  Post-op Vital Signs: Reviewed and stable  Complications: No apparent anesthesia complications

## 2013-09-17 NOTE — Progress Notes (Signed)
1 Day Post-Op Procedure(s) (LRB): HYSTERECTOMY SUPRACERVICAL ABDOMINAL (N/A) OOPHORECTOMY (Bilateral)  Subjective: Patient reports tolerating PO and no problems voiding.  Pt not requiring PCA, will d/c  Objective: I have reviewed patient's vital signs, medications and labs.  General: alert, cooperative and no distress Resp: clear to auscultation bilaterally GI: soft, non-tender; bowel sounds normal; no masses,  no organomegaly and incision: clean and dry CBC    Component Value Date/Time   WBC 9.7 09/17/2013 0602   RBC 3.58* 09/17/2013 0602   HGB 9.8* 09/17/2013 0602   HCT 30.6* 09/17/2013 0602   PLT 238 09/17/2013 0602   MCV 85.5 09/17/2013 0602   MCH 27.4 09/17/2013 0602   MCHC 32.0 09/17/2013 0602   RDW 14.3 09/17/2013 0602   LYMPHSABS 3.4 04/25/2011 0200   MONOABS 1.1* 04/25/2011 0200   EOSABS 0.2 04/25/2011 0200   BASOSABS 0.0 04/25/2011 0200     Assessment: s/p Procedure(s): HYSTERECTOMY SUPRACERVICAL ABDOMINAL (N/A) OOPHORECTOMY (Bilateral): stable, progressing well and tolerating diet  Plan: Advance diet Encourage ambulation Advance to PO medication Discontinue IV fluids d/c midday Thurs  LOS: 1 day    Jonnie Kind 09/17/2013, 8:16 AM

## 2013-09-17 NOTE — Addendum Note (Signed)
Addendum created 09/17/13 1125 by Vista Deck, CRNA   Modules edited: Notes Section   Notes Section:  File: [id number]

## 2013-09-17 NOTE — Progress Notes (Signed)
Pt ambulated length of hallway x 3 with standby assist with  no c/o shortness of breath or pain.

## 2013-09-23 ENCOUNTER — Telehealth: Payer: Self-pay | Admitting: Obstetrics and Gynecology

## 2013-09-23 NOTE — Telephone Encounter (Signed)
Spoke with pt. Pt had a hyst last week and is constipated. Pt has had issues with constipation "all her life". Pt has been on Linzess x 1 month. Pt wants to know if she can try taking Ibuprofen or Tylenol in place of pain med. I advised she could try Ibuprofen, but we don't want pain to get severe. Pt has a scheduled appt tomorrow. I also advised can try Miralax and Colace for the constipation. Pt voiced understanding. [place]

## 2013-09-23 NOTE — Telephone Encounter (Signed)
Left message x 1. JSY 

## 2013-09-24 ENCOUNTER — Encounter: Payer: Self-pay | Admitting: Obstetrics and Gynecology

## 2013-09-24 ENCOUNTER — Ambulatory Visit (INDEPENDENT_AMBULATORY_CARE_PROVIDER_SITE_OTHER): Payer: 59 | Admitting: Obstetrics and Gynecology

## 2013-09-24 VITALS — BP 130/84 | Ht 60.0 in | Wt 193.0 lb

## 2013-09-24 DIAGNOSIS — Z9889 Other specified postprocedural states: Secondary | ICD-10-CM

## 2013-09-24 MED ORDER — ESTRADIOL 0.52 MG/0.87 GM (0.06%) TD GEL: 1.0000 "application " | Freq: Every day | TRANSDERMAL | Status: DC

## 2013-09-24 NOTE — Patient Instructions (Signed)
May drive, Start the Elestrin transdermal nightly May shower May limit work begin 1 wk

## 2013-09-24 NOTE — Progress Notes (Signed)
   Subjective:  Valerie Henderson is a 48 y.o. female who presents to the clinic 1 weeks status post supracervical hysterectomy, bilateral oophorectomy and salpingectomy. due to endometriosis..   no HT so far. Review of Systems Negative except constipation on pain meds  She has been eating a regular diet without difficulty.   Bowel movements are abnormal with constipation til 5/26, now resolved. Pain is controlled with current analgesics. Medications being used: prescription NSAID's including ketorolac (Toradol).  Objective:  BP 130/84  Ht 5' (1.524 m)  Wt 193 lb (87.544 kg)  BMI 37.69 kg/m2  LMP 08/15/2013 General:Well developed, well nourished.  No acute distress. Abdomen: Bowel sounds normal, soft, non-tender. Incision(s):   Healing well, no drainage, no erythema, no hernia, no swelling, no dehiscence, incision well approximated.dressing d/c'd   Assessment:  Post-Op 1 weeks s/p supracervical hysterectomy and bilateral oophorectomy   doing well just on Advil Doing well postoperatively.   Plan:  1.Wound care discussed      Will begin transdermal estrogen-Elestrin 2. .Continue any current medications. 3. 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 Follow up in 4 week.

## 2013-09-28 NOTE — Discharge Summary (Addendum)
Physician Discharge Summary  Patient ID: Valerie Henderson MRN: [id number] DOB/AGE: [birth year] 48 y.o.  Admit date: 09/16/2013 Discharge date: 09/28/2013  Admission Diagnoses CERVICAL STENOSIS  DYSMENORRHEA     Discharge Diagnoses: CERVICAL STENOSIS  DYSMENORRHEA,endometriosis   Active Problems:   Endometriosis of pelvis   Discharged Condition: good  Hospital Course: Valerie Henderson is a 48 y.o. female presenting today for a f/u for suprapubic pain with menorrhagia during menses. Reports pain is so intense that she passes out and cramping is starting sooner and lasting later each month. Bleeding is "like water" and then "drips" for several days. She is having dyspareunia with a mild amount of spotting afterwards. H/O endometrial polyps treated with endometrial ablation in September 2104 with no improvement. SHG was attempted last visit but was unable to be performed due to cervical stenosis. Pt is s/p bilateral salpingectomy and endometrial ablation. Abdonimal hysterectomy surgery is scheduled on May 19th, 2015. Pt given phenetamine last visit for to help with weight loss prior to surgery. Reports a 10 lb weight loss.  Transvaginal US done on 08/06/13  Anteverted uterus noted with asymmetrical endometrial cavity noted, fundal area of thickening noted within endom. Cavity ?polyp with +Doppler flow noted (SHG attempted but Dr. Glo Herring was unable to insert catheter into cx due to stenosis)    Consults: None  Significant Diagnostic Studies: labs:  CBC Latest Ref Rng 09/17/2013 09/11/2013 01/13/2013  WBC 4.0 - 10.5 K/uL 9.7 8.6 10.6(H)  Hemoglobin 12.0 - 15.0 g/dL 9.8(L) 11.5(L) 11.9(L)  Hematocrit 36.0 - 46.0 % 30.6(L) 34.9(L) 36.5  Platelets 150 - 400 K/uL 238 253 287     Treatments: surgery: PROCEDURE: Procedure(s):  HYSTERECTOMY SUPRACERVICAL ABDOMINAL (N/A)  OOPHORECTOMY (Bilateral)  Hysterectomy was noteworthy in that significant endometriosis was found on the lateral aspects of the right  uterosacral ligament that extended over into the right retroperitoneum. The right ureter was closely adjacent to this structure. Supracervical hysterectomy was chosen for safety. See operative note for details. Bilateral salpingo-oophorectomy was achieved .   Discharge Exam: Blood pressure 131/74, pulse 66, temperature 98.6 F (37 C), temperature source Oral, resp. rate 18, height 5' (1.524 m), weight 89.359 kg (197 lb), last menstrual period 07/09/2013, SpO2 95.00%. General appearance: alert, cooperative and no distress Head: Normocephalic, without obvious abnormality, atraumatic Resp: clear to auscultation bilaterally GI: soft, non-tender; bowel sounds normal; no masses,  no organomegaly and incision intact Pelvic: Cervix left in place  Disposition: 01-Home or Self Care  Discharge Instructions   Call MD for:  persistant nausea and vomiting    Complete by:  As directed      Call MD for:  severe uncontrolled pain    Complete by:  As directed      Call MD for:  temperature >100.4    Complete by:  As directed      Diet - low sodium heart healthy    Complete by:  As directed      Discharge wound care:    Complete by:  As directed   Keep clean and dry     Driving Restrictions    Complete by:  As directed   Do not drive 1 weeks     Increase activity slowly    Complete by:  As directed      Lifting restrictions    Complete by:  As directed   No lifting greater than 10 pounds     Sexual Activity Restrictions    Complete by:  As directed  None for 6 weeks            Medication List         albuterol 108 (90 BASE) MCG/ACT inhaler  Commonly known as:  PROVENTIL HFA;VENTOLIN HFA  Inhale 2 puffs into the lungs every 6 (six) hours as needed. For breathing     budesonide-formoterol 80-4.5 MCG/ACT inhaler  Commonly known as:  SYMBICORT  Inhale 2 puffs into the lungs 2 (two) times daily.     gabapentin 300 MG capsule  Commonly known as:  NEURONTIN  Take 1 capsule (300 mg  total) by mouth 3 (three) times daily.     levothyroxine 100 MCG tablet  Commonly known as:  SYNTHROID, LEVOTHROID  take 1 tablet once daily     Linaclotide 145 MCG Caps capsule  Commonly known as:  LINZESS  Take 1 capsule (145 mcg total) by mouth daily. Take 30 minutes before breakfast     loratadine 10 MG tablet  Commonly known as:  CLARITIN  Take 10 mg by mouth daily.     ondansetron 4 MG tablet  Commonly known as:  ZOFRAN  Take 1 tablet (4 mg total) by mouth every 6 (six) hours as needed for nausea.     valACYclovir 1000 MG tablet  Commonly known as:  VALTREX  Take 1,000 mg by mouth as needed (breakout).     zolpidem 5 MG tablet  Commonly known as:  AMBIEN  Take 1 tablet (5 mg total) by mouth at bedtime as needed for sleep.        hormone therapy with estrogens will be begun postoperatively as an outpatient The patient actually did well enough that she was stable for discharge on the evening of postop day 1, given Percocet for pain, with followup 1 week [facility]  Signed: Mallory Shirk V 09/28/2013, 1:33 PM

## 2013-09-29 ENCOUNTER — Encounter: Payer: Self-pay | Admitting: Obstetrics and Gynecology

## 2013-09-29 ENCOUNTER — Ambulatory Visit (INDEPENDENT_AMBULATORY_CARE_PROVIDER_SITE_OTHER): Payer: 59 | Admitting: Obstetrics and Gynecology

## 2013-09-29 ENCOUNTER — Telehealth: Payer: Self-pay | Admitting: *Deleted

## 2013-09-29 VITALS — BP 130/86 | Ht 60.0 in | Wt 192.6 lb

## 2013-09-29 DIAGNOSIS — Z9889 Other specified postprocedural states: Secondary | ICD-10-CM

## 2013-09-29 NOTE — Patient Instructions (Signed)
Topical neosporin

## 2013-09-29 NOTE — Progress Notes (Signed)
Patient ID: Valerie Henderson, female   DOB: [birth year], 48 y.o.   MRN: [id number] Wound check pt has 2 tiny areas on left end of incision that are red, 2-3 mm wide, and represent rapid healing after cautery of incision. No suspicion of deep infection No fever. Forgetting to take ht.

## 2013-09-29 NOTE — Telephone Encounter (Signed)
Pt states has some concerns about incision site, redness, puffy, and a small area that does not appear to be healing as well. Pt told to be here today at 3 pm to see Dr. Glo Herring.

## 2013-10-13 ENCOUNTER — Other Ambulatory Visit: Payer: Self-pay | Admitting: Obstetrics and Gynecology

## 2013-10-13 NOTE — Telephone Encounter (Signed)
ambien 5 rx'd x 30 tabs pt to pick up Rx

## 2013-10-22 ENCOUNTER — Ambulatory Visit (INDEPENDENT_AMBULATORY_CARE_PROVIDER_SITE_OTHER): Payer: Self-pay | Admitting: Obstetrics and Gynecology

## 2013-10-22 ENCOUNTER — Encounter: Payer: Self-pay | Admitting: Obstetrics and Gynecology

## 2013-10-22 VITALS — BP 110/72 | Ht 60.0 in | Wt 194.0 lb

## 2013-10-22 DIAGNOSIS — Z9889 Other specified postprocedural states: Secondary | ICD-10-CM | POA: Insufficient documentation

## 2013-10-22 MED ORDER — POTASSIUM CHLORIDE CRYS ER 20 MEQ PO TBCR: 20.0000 meq | EXTENDED_RELEASE_TABLET | Freq: Every day | ORAL | Status: DC

## 2013-10-22 MED ORDER — HYDROCHLOROTHIAZIDE 25 MG PO TABS: 25.0000 mg | ORAL_TABLET | Freq: Every day | ORAL | Status: DC

## 2013-10-22 NOTE — Patient Instructions (Signed)
Thank you for letting us care for you

## 2013-10-22 NOTE — Progress Notes (Signed)
  This chart was scribed by Ludger Nutting, Medical Scribe, for Dr. Mallory Shirk on 10/22/13 at 9:40 AM. This chart was reviewed by Dr. Mallory Shirk for accuracy.  Subjective:  Valerie Henderson is a 48 y.o. female who presents to the clinic 5 weeks status post supracervical hysterectomy and bilateral oophorectomy.   Review of Systems Negative except pain and pressure at the end of voiding.  Notices less urinary frequency.   She has been eating a regular diet without difficulty.   Bowel movements are normal. The patient is not having any pain.  Objective:  BP 110/72  Ht 5' (1.524 m)  Wt 194 lb (87.998 kg)  BMI 37.89 kg/m2  LMP 08/15/2013 General:Well developed, well nourished.  No acute distress. Abdomen: Bowel sounds normal, soft, non-tender. Pelvic Exam:    External Genitalia:  Normal.    Vagina: Normal    Bimanual: Normal    Cervix: Normal, small, well-supported  Incision(s):   Healing well, no drainage, no erythema, no hernia, no swelling, no dehiscence, incision well approximated. Stitch knot removed from left side   POCT urine is negative.    Assessment:  Post-Op 5 weeks s/p supracervical hysterectomy and bilateral oophorectomy    Doing well postoperatively.   Plan:  1.Wound care discussed   2.  Add HCtZ AND  Kdur x 3 months due to puffiness above incision. 3. Activity restrictions: no restriction 4. return to work: already at work. 5. Follow up PRN or in 1 year

## 2013-10-27 DIAGNOSIS — Z029 Encounter for administrative examinations, unspecified: Secondary | ICD-10-CM

## 2013-11-18 ENCOUNTER — Other Ambulatory Visit: Payer: Self-pay | Admitting: Obstetrics and Gynecology

## 2013-11-18 NOTE — Telephone Encounter (Signed)
refil ambien x 20 tab refil x 1

## 2013-11-20 ENCOUNTER — Other Ambulatory Visit: Payer: Self-pay | Admitting: Obstetrics and Gynecology

## 2013-11-26 ENCOUNTER — Encounter: Payer: Self-pay | Admitting: Internal Medicine

## 2013-12-27 ENCOUNTER — Other Ambulatory Visit: Payer: Self-pay | Admitting: Adult Health

## 2014-03-02 ENCOUNTER — Encounter: Payer: Self-pay | Admitting: Obstetrics and Gynecology

## 2014-09-02 ENCOUNTER — Other Ambulatory Visit: Payer: Self-pay

## 2014-09-02 DIAGNOSIS — Z1231 Encounter for screening mammogram for malignant neoplasm of breast: Secondary | ICD-10-CM

## 2014-09-23 ENCOUNTER — Ambulatory Visit
Admission: RE | Admit: 2014-09-23 | Discharge: 2014-09-23 | Disposition: A | Discharge status: Home / Self Care | Payer: Commercial Indemnity | Source: Ambulatory Visit

## 2014-09-23 DIAGNOSIS — Z1231 Encounter for screening mammogram for malignant neoplasm of breast: Secondary | ICD-10-CM

## 2015-01-29 ENCOUNTER — Other Ambulatory Visit: Payer: Self-pay | Admitting: Adult Health

## 2015-06-02 ENCOUNTER — Telehealth: Payer: Self-pay | Admitting: *Deleted

## 2015-06-02 DIAGNOSIS — Z139 Encounter for screening, unspecified: Secondary | ICD-10-CM

## 2015-06-02 DIAGNOSIS — E039 Hypothyroidism, unspecified: Secondary | ICD-10-CM

## 2015-06-02 NOTE — Telephone Encounter (Signed)
Labs ordered 2/6

## 2015-06-10 ENCOUNTER — Encounter: Payer: Self-pay | Admitting: Adult Health

## 2015-06-10 ENCOUNTER — Ambulatory Visit (INDEPENDENT_AMBULATORY_CARE_PROVIDER_SITE_OTHER): Payer: 59 | Admitting: Adult Health

## 2015-06-10 ENCOUNTER — Other Ambulatory Visit (HOSPITAL_COMMUNITY)
Admission: RE | Admit: 2015-06-10 | Discharge: 2015-06-10 | Disposition: A | Discharge status: Home / Self Care | Payer: 59 | Source: Ambulatory Visit | Attending: Adult Health | Admitting: Adult Health

## 2015-06-10 VITALS — BP 140/98 | HR 68 | Ht 58.5 in | Wt 177.0 lb

## 2015-06-10 DIAGNOSIS — Z1151 Encounter for screening for human papillomavirus (HPV): Secondary | ICD-10-CM | POA: Diagnosis not present

## 2015-06-10 DIAGNOSIS — Z01411 Encounter for gynecological examination (general) (routine) with abnormal findings: Secondary | ICD-10-CM

## 2015-06-10 DIAGNOSIS — R030 Elevated blood-pressure reading, without diagnosis of hypertension: Secondary | ICD-10-CM

## 2015-06-10 DIAGNOSIS — Z01419 Encounter for gynecological examination (general) (routine) without abnormal findings: Secondary | ICD-10-CM | POA: Insufficient documentation

## 2015-06-10 DIAGNOSIS — Z1211 Encounter for screening for malignant neoplasm of colon: Secondary | ICD-10-CM

## 2015-06-10 DIAGNOSIS — IMO0001 Reserved for inherently not codable concepts without codable children: Secondary | ICD-10-CM

## 2015-06-10 DIAGNOSIS — E039 Hypothyroidism, unspecified: Secondary | ICD-10-CM | POA: Diagnosis not present

## 2015-06-10 HISTORY — DX: Reserved for inherently not codable concepts without codable children: IMO0001

## 2015-06-10 LAB — HEMOCCULT GUIAC POC 1CARD (OFFICE): Fecal Occult Blood, POC: NEGATIVE

## 2015-06-10 NOTE — Progress Notes (Signed)
Patient ID: Valerie Henderson, female   DOB: 11/05/1965, 50 y.o.   MRN: [id number] History of Present Illness: Valerie Henderson is a 50 year old white female, married in for a well woman gyn exam and pap.She says she has felt 200 % better since [facility].   Current Medications, Allergies, Past Medical History, Past Surgical History, Family History and Social History were reviewed in [employer] record.     Review of Systems: Patient denies any headaches, hearing loss, fatigue, blurred vision, shortness of breath, chest pain, abdominal pain, problems with bowel movements, urination, or intercourse. No joint pain or mood swings.    Physical Exam:BP 140/98 mmHg  Pulse 68  Ht 4' 10.5" (1.486 m)  Wt 177 lb (80.287 kg)  BMI 36.36 kg/m2  LMP 08/15/2013 BP recheck still elevated. General:  Well developed, well nourished, no acute distress Skin:  Warm and dry Neck:  Midline trachea, enlarged thyroid, good ROM, no lymphadenopathy Lungs; Clear to auscultation bilaterally Breast:  No dominant palpable mass, retraction, or nipple discharge Cardiovascular: Regular rate and rhythm Abdomen:  Soft, non tender, no hepatosplenomegaly Pelvic:  External genitalia is normal in appearance, no lesions.  The vagina is normal in appearance. Urethra has no lesions or masses. The cervix is smooth, pap with HPV performed.  Uterus is absent.  No adnexal masses or tenderness noted.Bladder is non tender, no masses felt. Rectal: Good sphincter tone, no polyps, or hemorrhoids felt.  Hemoccult negative. Extremities/musculoskeletal:  No swelling or varicosities noted, no clubbing or cyanosis Psych:  No mood changes, alert and cooperative,seems happy   Impression: Well woman gyn exam and pap Hypothyroidism Elevated BP    Plan: Recheck BP in 1 week Get labs in am, will continue meds as is til labs back and refill then Mammogram yearly Physical in 1 year, pap in 3 if normal

## 2015-06-10 NOTE — Patient Instructions (Signed)
Get labs Physical in 1 year, pa pin 3 if normal Get mammogram

## 2015-06-14 ENCOUNTER — Telehealth: Payer: Self-pay | Admitting: Adult Health

## 2015-06-14 NOTE — Telephone Encounter (Signed)
Had labs today, forgot last week, BP 130/90 in right arm, has been better at home, will keep check for now

## 2015-06-15 LAB — CBC
HEMATOCRIT: 37.4 % (ref 34.0–46.6)
Hemoglobin: 12.2 g/dL (ref 11.1–15.9)
MCH: 27.6 pg (ref 26.6–33.0)
MCHC: 32.6 g/dL (ref 31.5–35.7)
MCV: 85 fL (ref 79–97)
Platelets: 242 10*3/uL (ref 150–379)
RBC: 4.42 x10E6/uL (ref 3.77–5.28)
RDW: 14 % (ref 12.3–15.4)
WBC: 7.7 10*3/uL (ref 3.4–10.8)

## 2015-06-15 LAB — COMPREHENSIVE METABOLIC PANEL
ALT: 14 IU/L (ref 0–32)
AST: 16 IU/L (ref 0–40)
Albumin/Globulin Ratio: 1.8 (ref 1.1–2.5)
Albumin: 4.2 g/dL (ref 3.5–5.5)
Alkaline Phosphatase: 50 IU/L (ref 39–117)
BUN/Creatinine Ratio: 16 (ref 9–23)
BUN: 11 mg/dL (ref 6–24)
Bilirubin Total: 0.4 mg/dL (ref 0.0–1.2)
CALCIUM: 9.5 mg/dL (ref 8.7–10.2)
CO2: 27 mmol/L (ref 18–29)
CREATININE: 0.67 mg/dL (ref 0.57–1.00)
Chloride: 102 mmol/L (ref 96–106)
GFR calc Af Amer: 120 mL/min/{1.73_m2} (ref >59)
GFR calc non Af Amer: 104 mL/min/{1.73_m2} (ref >59)
GLOBULIN, TOTAL: 2.4 g/dL (ref 1.5–4.5)
Glucose: 91 mg/dL (ref 65–99)
POTASSIUM: 4.7 mmol/L (ref 3.5–5.2)
SODIUM: 142 mmol/L (ref 134–144)
TOTAL PROTEIN: 6.6 g/dL (ref 6.0–8.5)

## 2015-06-15 LAB — LIPID PANEL
CHOL/HDL RATIO: 3 ratio (ref 0.0–4.4)
Cholesterol, Total: 188 mg/dL (ref 100–199)
HDL: 63 mg/dL (ref >39)
LDL CALC: 111 mg/dL — AB (ref 0–99)
Triglycerides: 68 mg/dL (ref 0–149)
VLDL CHOLESTEROL CAL: 14 mg/dL (ref 5–40)

## 2015-06-15 LAB — HEMOGLOBIN A1C
Est. average glucose Bld gHb Est-mCnc: 117 mg/dL
HEMOGLOBIN A1C: 5.7 % — AB (ref 4.8–5.6)

## 2015-06-15 LAB — TSH: TSH: 2.44 u[IU]/mL (ref 0.450–4.500)

## 2015-06-15 LAB — CYTOLOGY - PAP

## 2015-06-15 LAB — VITAMIN D 25 HYDROXY (VIT D DEFICIENCY, FRACTURES): Vit D, 25-Hydroxy: 44.2 ng/mL (ref 30.0–100.0)

## 2015-06-16 ENCOUNTER — Telehealth: Payer: Self-pay | Admitting: Adult Health

## 2015-06-16 MED ORDER — ZOLPIDEM TARTRATE 5 MG PO TABS: 5.0000 mg | ORAL_TABLET | Freq: Every day | ORAL | Status: DC

## 2015-06-16 MED ORDER — LEVOTHYROXINE SODIUM 100 MCG PO TABS: 100.0000 ug | ORAL_TABLET | Freq: Every day | ORAL | Status: DC

## 2015-06-16 NOTE — Telephone Encounter (Signed)
Pt aware of labs, continue synthroid, decrease carbs, but labs looked good

## 2015-11-09 ENCOUNTER — Other Ambulatory Visit (HOSPITAL_COMMUNITY): Payer: Self-pay | Admitting: Family Medicine

## 2015-11-09 DIAGNOSIS — Z1231 Encounter for screening mammogram for malignant neoplasm of breast: Secondary | ICD-10-CM

## 2015-11-22 ENCOUNTER — Ambulatory Visit (HOSPITAL_COMMUNITY): Payer: 59

## 2015-11-22 ENCOUNTER — Ambulatory Visit (HOSPITAL_COMMUNITY)
Admission: RE | Admit: 2015-11-22 | Discharge: 2015-11-22 | Disposition: A | Discharge status: Home / Self Care | Payer: 59 | Source: Ambulatory Visit | Attending: Family Medicine | Admitting: Family Medicine

## 2015-11-22 ENCOUNTER — Encounter (HOSPITAL_COMMUNITY): Payer: Self-pay

## 2015-11-22 DIAGNOSIS — Z1231 Encounter for screening mammogram for malignant neoplasm of breast: Secondary | ICD-10-CM | POA: Diagnosis present

## 2015-12-28 ENCOUNTER — Ambulatory Visit (INDEPENDENT_AMBULATORY_CARE_PROVIDER_SITE_OTHER): Payer: 59 | Admitting: Allergy & Immunology

## 2015-12-28 ENCOUNTER — Encounter: Payer: Self-pay | Admitting: Allergy & Immunology

## 2015-12-28 VITALS — BP 120/70 | HR 80 | Temp 98.3°F | Resp 16 | Ht 59.65 in | Wt 181.0 lb

## 2015-12-28 DIAGNOSIS — J309 Allergic rhinitis, unspecified: Secondary | ICD-10-CM | POA: Diagnosis not present

## 2015-12-28 DIAGNOSIS — J4540 Moderate persistent asthma, uncomplicated: Secondary | ICD-10-CM | POA: Diagnosis not present

## 2015-12-28 DIAGNOSIS — J3089 Other allergic rhinitis: Secondary | ICD-10-CM

## 2015-12-28 MED ORDER — BUDESONIDE-FORMOTEROL FUMARATE 160-4.5 MCG/ACT IN AERO: 2.0000 | INHALATION_SPRAY | Freq: Two times a day (BID) | RESPIRATORY_TRACT | 5 refills | Status: DC

## 2015-12-28 MED ORDER — ALBUTEROL SULFATE HFA 108 (90 BASE) MCG/ACT IN AERS: 2.0000 | INHALATION_SPRAY | Freq: Four times a day (QID) | RESPIRATORY_TRACT | 2 refills | Status: DC | PRN

## 2015-12-28 NOTE — Progress Notes (Signed)
FOLLOW UP  Date of Service/Encounter:  12/28/15   Assessment:   Moderate persistent asthma, uncomplicated  Perennial allergic rhinitis   Asthma Reportables:  Severity: : moderate persistent  Risk: high due to lack of compliance Control: not well controlled  Seasonal Influenza Vaccine: no but encouraged (not available yet)   Plan/Recommendations:    1. Moderate persistent asthma, uncomplicated - Continue Symbicort 160/4.5 two puffs in the morning and two puffs at night. - Use a spacer (patient will come by next week for one since we did not have them today). - Symbicort sample provided today. - Continue with albuterol 4 puffs every 4-6 hours as needed for shortness of breath. - Lung testing looked fairly good today with some reversibility. There was still obstruction post bronchodilator but this was because of the marked improvement in the FVC. Will monitor over time.   2. Perennial allergic rhinitis - Continue with cetirizine 10mg  daily. - Use a nasal steroid such as Flonase as needed.  3. Return to clinic in six months.    Subjective:   Valerie Henderson is a 50 y.o. female presenting today for follow up of  Chief Complaint  Patient presents with  . Asthma  .  Valerie Henderson has a history of the following: Patient Active Problem List   Diagnosis Date Noted  . Elevated BP 06/10/2015  . Post-operative state 10/22/2013  . Endometriosis of pelvis 09/16/2013  . Cervical os stenosis 08/06/2013  . Menorrhagia 08/06/2013  . Sterilization 01/14/2013  . Pre-op testing 12/25/2012  . Fibromyalgia 12/16/2012  . Irregular bleeding 12/05/2012  . Hypothyroid 12/05/2012  . Constipation 12/05/2012    History obtained from: chart review and patient.  Valerie Henderson was referred by Valerie Kilts, MD.     Valerie Henderson is a 50 y.o. female presenting for a follow up visit for persistent asthma and allergic rhinitis. The patient was last seen in March 2015 by Valerie Henderson, who has  since left the practice. At that time, she was doing well with her Symbicort. She did endorse exercise induced shortness of breath. She was on cetirizine for her allergies as well as Flonase as needed. Her last environmental allergy testing was performed in August 2010. She was positive to grass, trees, mold, dust mite, and cat. She had previously been on Advair and Dulera, but she was continuing to need her rescue inhaler.   Since last visit, she reports that things have been going about the same. She remains on the Symbicort, which she takes 2 puffs twice daily. She continues to use inhalers she received last 2 years ago. Her primary care physician has not been filling the medication. She has been making it last longer due to the cost. She last used her albuterol two months ago, and estimates that she uses it every 2-3 months. Triggers include viral URIs, perfumes, physical activity. She has had no ED visits, courses of prednisone, or urgent care visits for her breathing. She coughs in the morning but does not wake herself up coughing in the middle of the night.   Valerie Henderson's allergic rhinitis is fairly well controlled. Her symptoms worsen with cats and mowing the years. She takes cetirizine 10mg  daily. She does not use her nasal spray at all. She does use eye drops (OTC - natural tear drop). She does fairly well in the winter with her worst in the spring.   Otherwise, there have been no changes to the past medical history, surgical history, family history,  or social history. She does have a history of hypothyroidism which is stable on levothyroxine. Valerie Henderson lives at home with her husband. There was one outside dog. Currently she works as a Museum/gallery conservator.     Review of Systems: a 14-point review of systems is pertinent for what is mentioned in HPI.  Otherwise, all other systems were negative. Constitutional: negative other than that listed in the HPI Eyes: negative other than that listed in the HPI Ears, nose,  mouth, throat, and face: negative other than that listed in the HPI Respiratory: negative other than that listed in the HPI Cardiovascular: negative other than that listed in the HPI Gastrointestinal: negative other than that listed in the HPI Genitourinary: negative other than that listed in the HPI Integument: negative other than that listed in the HPI Hematologic: negative other than that listed in the HPI Musculoskeletal: negative other than that listed in the HPI Neurological: negative other than that listed in the HPI Allergy/Immunologic: negative other than that listed in the HPI    Objective:   Blood pressure 120/70, pulse 80, temperature 98.3 F (36.8 C), temperature source Oral, resp. rate 16, height 4' 11.65" (1.515 m), weight 181 lb (82.1 kg), last menstrual period 08/15/2013, SpO2 96 %. Body mass index is 35.77 kg/m.   Physical Exam:  General: Alert, interactive, in no acute distress. Very pleasant female.  HEENT: TMs pearly gray, turbinates markedly edematous with clear discharge, post-pharynx erythematous. Neck: Supple without thyromegaly. Adenopathy: no enlarged lymph nodes appreciated in the anterior cervical, occipital, axillary, epitrochlear, inguinal, or popliteal regions Lungs: Clear to auscultation without wheezing, rhonchi or rales. no increased work of breathing. CV: Normal S1, S2 without murmurs. Capillary refill <2 seconds.  Abdomen: Nondistended, nontender. Skin: Warm and dry, without lesions or rashes. Extremities:  No clubbing, cyanosis or edema. Neuro:   Grossly intact.   Diagnostic studies:  Spirometry: results normal (FEV1: 1.53/70%, FVC: 1.98/77%, FEV1/FVC: 77%).    Spirometry consistent with possible restrictive disease.We did administer a DuoNeb in clinic today. There was a 36% increase (778mL) in the FVC. There was no change in the FEV1. This resulted in a change in the FEV1 to FVC ratio which was initially 77% prebronchodilator but decreased  to 56% postbronchodilator.   Allergy Studies: None    Valerie Marvel, MD [facility]

## 2015-12-28 NOTE — Patient Instructions (Signed)
1. Moderate persistent asthma, uncomplicated - Continue Symbicort 160/4.5 two puffs in the morning and two puffs at night. - Use a spacer!  - Continue with albuterol 4 puffs every 4-6 hours as needed for shortness of breath. - Lung testing looked fairly good today.  2. Perennial allergic rhinitis - Continue with cetirizine 10mg  daily. - Use a nasal steroid such as Flonase as needed.  3. Return to clinic in six months.   It was a pleasure meeting you today!

## 2015-12-29 ENCOUNTER — Other Ambulatory Visit: Payer: Self-pay | Admitting: Adult Health

## 2016-06-07 DIAGNOSIS — J4520 Mild intermittent asthma, uncomplicated: Secondary | ICD-10-CM | POA: Diagnosis not present

## 2016-06-07 DIAGNOSIS — R7309 Other abnormal glucose: Secondary | ICD-10-CM | POA: Diagnosis not present

## 2016-06-07 DIAGNOSIS — Z0001 Encounter for general adult medical examination with abnormal findings: Secondary | ICD-10-CM | POA: Diagnosis not present

## 2016-06-07 DIAGNOSIS — E039 Hypothyroidism, unspecified: Secondary | ICD-10-CM | POA: Diagnosis not present

## 2016-06-07 DIAGNOSIS — Z23 Encounter for immunization: Secondary | ICD-10-CM | POA: Diagnosis not present

## 2016-06-12 DIAGNOSIS — Z1389 Encounter for screening for other disorder: Secondary | ICD-10-CM | POA: Diagnosis not present

## 2016-06-12 DIAGNOSIS — Z23 Encounter for immunization: Secondary | ICD-10-CM | POA: Diagnosis not present

## 2016-06-12 DIAGNOSIS — Z0001 Encounter for general adult medical examination with abnormal findings: Secondary | ICD-10-CM | POA: Diagnosis not present

## 2016-06-15 ENCOUNTER — Other Ambulatory Visit: Payer: Self-pay | Admitting: Internal Medicine

## 2016-06-15 DIAGNOSIS — Z1211 Encounter for screening for malignant neoplasm of colon: Secondary | ICD-10-CM

## 2016-06-27 ENCOUNTER — Ambulatory Visit: Payer: 59 | Admitting: Allergy & Immunology

## 2016-06-27 ENCOUNTER — Encounter: Payer: Self-pay | Admitting: Allergy & Immunology

## 2016-06-27 ENCOUNTER — Ambulatory Visit (INDEPENDENT_AMBULATORY_CARE_PROVIDER_SITE_OTHER): Payer: 59 | Admitting: Allergy & Immunology

## 2016-06-27 VITALS — BP 140/90 | HR 78 | Temp 98.8°F | Resp 18

## 2016-06-27 DIAGNOSIS — J4540 Moderate persistent asthma, uncomplicated: Secondary | ICD-10-CM | POA: Diagnosis not present

## 2016-06-27 DIAGNOSIS — J3089 Other allergic rhinitis: Secondary | ICD-10-CM

## 2016-06-27 MED ORDER — FLUTICASONE PROPIONATE 50 MCG/ACT NA SUSP: 1.0000 | Freq: Every day | NASAL | 5 refills | Status: DC

## 2016-06-27 NOTE — Patient Instructions (Addendum)
1. Moderate persistent asthma, uncomplicated - Lung function looked great today. - Since you are doing fairly well, we will try you on Symbicort 80/4.5 two puffs twice daily. - Daily controller medication(s): Symbicort 80/4.5 two puffs twice daily with spacer - Rescue medications: ProAir 4 puffs every 4-6 hours as needed - Asthma control goals:  * Full participation in all desired activities (may need albuterol before activity) * Albuterol use two time or less a week on average (not counting use with activity) * Cough interfering with sleep two time or less a month * Oral steroids no more than once a year * No hospitalizations  2. Perennial allergic rhinitis - Continue with cetirizine 10mg  daily. - Use a nasal steroid such as Flonase, but use daily since your worst season is approaching.   3. Return in about 6 months (around 12/25/2016).  Please inform us of any Emergency Department visits, hospitalizations, or changes in symptoms. Call us before going to the ED for breathing or allergy symptoms since we might be able to fit you in for a sick visit. Feel free to contact us anytime with any questions, problems, or concerns.  It was a pleasure to see you again today! I am so sorry to hear about your sister-in-law's passing.   Websites that have reliable patient information: 1. American Academy of Asthma, Allergy, and Immunology: www.aaaai.org 2. Food Allergy Research and Education (FARE): foodallergy.org 3. Mothers of Asthmatics: http://www.asthmacommunitynetwork.org 4. American College of Allergy, Asthma, and Immunology: www.acaai.org

## 2016-06-27 NOTE — Progress Notes (Signed)
FOLLOW UP  Date of Service/Encounter:  06/27/16   Assessment:   Moderate persistent asthma, uncomplicated  Perennial allergic rhinitis   Plan/Recommendations:   1. Moderate persistent asthma, uncomplicated - Lung function looked great today. - Since you are doing fairly well, we will try you on Symbicort 80/4.5 two puffs twice daily. - Daily controller medication(s): Symbicort 80/4.5 two puffs twice daily with spacer - Rescue medications: ProAir 4 puffs every 4-6 hours as needed - Asthma control goals:  * Full participation in all desired activities (may need albuterol before activity) * Albuterol use two time or less a week on average (not counting use with activity) * Cough interfering with sleep two time or less a month * Oral steroids no more than once a year * No hospitalizations  2. Perennial allergic rhinitis - Continue with cetirizine 10mg  daily. - Use a nasal steroid such as Flonase, but use daily since your worst season is approaching.   3. Return in about 6 months (around 12/25/2016).   Subjective:   Valerie Henderson is a 51 y.o. female presenting today for follow up of  Chief Complaint  Patient presents with  . Asthma    Valerie Henderson has a history of the following: Patient Active Problem List   Diagnosis Date Noted  . Elevated BP 06/10/2015  . Post-operative state 10/22/2013  . Endometriosis of pelvis 09/16/2013  . Cervical os stenosis 08/06/2013  . Menorrhagia 08/06/2013  . Sterilization 01/14/2013  . Pre-op testing 12/25/2012  . Fibromyalgia 12/16/2012  . Irregular bleeding 12/05/2012  . Hypothyroid 12/05/2012  . Constipation 12/05/2012    History obtained from: chart review and patient.  Christophe Louis was referred by Purvis Kilts, MD.     Valerie Henderson is a 51 y.o. female presenting for a follow up visit. She was last seen in August 2017 at which time she was doing well. We did add a spacer to her regimen with Symbicort 160/4.5 two puffs twice  daily. We continued her on Symbicort 160/4.5 two puffs twice daily. She was also continued on Flonase and cetirizine.   Since the last visit, she has had some rough personal challenges. Her sister in law passed away at age 70 from a brain aneurysm. This occurred over the weekend. She left three kids (63yo twins and a 97yo). Thankfully they have a close extended family and a large support network. Other than this, she is doing well. She did have an episode of a prolonged coughing illness that led to the need for antibiotic with resolution. She did use her rescue inhaler although this is quite rare. Allergies are controlled with fluticasone and cetirizine. Her worst season is coming up soon. She is currently using Flonase only as needed.  Otherwise, there have been no changes to her past medical history, surgical history, family history, or social history.    Review of Systems: a 14-point review of systems is pertinent for what is mentioned in HPI.  Otherwise, all other systems were negative. Constitutional: negative other than that listed in the HPI Eyes: negative other than that listed in the HPI Ears, nose, mouth, throat, and face: negative other than that listed in the HPI Respiratory: negative other than that listed in the HPI Cardiovascular: negative other than that listed in the HPI Gastrointestinal: negative other than that listed in the HPI Genitourinary: negative other than that listed in the HPI Integument: negative other than that listed in the HPI Hematologic: negative other than that listed in  the HPI Musculoskeletal: negative other than that listed in the HPI Neurological: negative other than that listed in the HPI Allergy/Immunologic: negative other than that listed in the HPI    Objective:   Blood pressure 140/90, pulse 78, temperature 98.8 F (37.1 C), temperature source Oral, resp. rate 18, last menstrual period 08/15/2013, SpO2 99 %. There is no height or weight on file  to calculate BMI.   Physical Exam:  General: Alert, interactive, in no acute distress. Cooperative with the exam. Somewhat sad.  Eyes: No conjunctival injection present on the right, No conjunctival injection present on the left, PERRL bilaterally, No discharge on the right, No discharge on the left and No Horner-Trantas dots present Ears: Right TM pearly gray with normal light reflex, Left TM pearly gray with normal light reflex, Right TM intact without perforation and Left TM intact without perforation.  Nose/Throat: External nose within normal limits, nasal crease present and septum midline, turbinates edematous with clear discharge, post-pharynx mildly erythematous without cobblestoning in the posterior oropharynx. Tonsils 2+ without exudates Neck: Supple without thyromegaly. Lungs: Clear to auscultation without wheezing, rhonchi or rales. No increased work of breathing. CV: Normal S1/S2, no murmurs. Capillary refill <2 seconds.  Skin: Warm and dry, without lesions or rashes. Neuro:   Grossly intact. No focal deficits appreciated. Responsive to questions.   Diagnostic studies:  Spirometry: results normal (FEV1: 1.64/85%, FVC: 2.09/90%, FEV1/FVC: 78%).    Spirometry consistent with normal pattern.  Allergy Studies: None    Salvatore Marvel, MD [facility]

## 2016-06-29 ENCOUNTER — Other Ambulatory Visit: Payer: Self-pay | Admitting: Family Medicine

## 2016-06-29 DIAGNOSIS — Z1211 Encounter for screening for malignant neoplasm of colon: Secondary | ICD-10-CM

## 2016-07-04 ENCOUNTER — Ambulatory Visit: Payer: 59

## 2016-07-12 ENCOUNTER — Telehealth: Payer: Self-pay | Admitting: Allergy & Immunology

## 2016-07-12 MED ORDER — BUDESONIDE-FORMOTEROL FUMARATE 80-4.5 MCG/ACT IN AERO: 2.0000 | INHALATION_SPRAY | Freq: Two times a day (BID) | RESPIRATORY_TRACT | 5 refills | Status: DC

## 2016-07-12 NOTE — Telephone Encounter (Signed)
Pt called and needs to have symbicort 80/45 called into [facility] in [place].  [phone].

## 2016-07-12 NOTE — Telephone Encounter (Signed)
Called patient and informed her that I sent the script to [facility] in [place].

## 2016-07-14 ENCOUNTER — Inpatient Hospital Stay
Admission: RE | Admit: 2016-07-14 | Discharge: 2016-07-14 | Disposition: A | Discharge status: Home / Self Care | Payer: 59 | Source: Ambulatory Visit | Attending: Family Medicine | Admitting: Family Medicine

## 2016-07-14 ENCOUNTER — Ambulatory Visit: Payer: 59

## 2016-08-09 DIAGNOSIS — E039 Hypothyroidism, unspecified: Secondary | ICD-10-CM | POA: Diagnosis not present

## 2016-08-14 ENCOUNTER — Ambulatory Visit
Admission: RE | Admit: 2016-08-14 | Discharge: 2016-08-14 | Disposition: A | Discharge status: Home / Self Care | Payer: 59 | Source: Ambulatory Visit | Attending: Family Medicine | Admitting: Family Medicine

## 2016-08-14 DIAGNOSIS — C189 Malignant neoplasm of colon, unspecified: Secondary | ICD-10-CM | POA: Diagnosis not present

## 2016-08-14 DIAGNOSIS — Z1211 Encounter for screening for malignant neoplasm of colon: Secondary | ICD-10-CM

## 2016-11-17 DIAGNOSIS — E039 Hypothyroidism, unspecified: Secondary | ICD-10-CM | POA: Diagnosis not present

## 2016-12-11 DIAGNOSIS — M79673 Pain in unspecified foot: Secondary | ICD-10-CM | POA: Diagnosis not present

## 2016-12-11 DIAGNOSIS — M76829 Posterior tibial tendinitis, unspecified leg: Secondary | ICD-10-CM | POA: Diagnosis not present

## 2016-12-26 ENCOUNTER — Ambulatory Visit (INDEPENDENT_AMBULATORY_CARE_PROVIDER_SITE_OTHER): Payer: 59 | Admitting: Allergy & Immunology

## 2016-12-26 ENCOUNTER — Encounter: Payer: Self-pay | Admitting: Allergy & Immunology

## 2016-12-26 VITALS — BP 128/80 | HR 72 | Resp 16

## 2016-12-26 DIAGNOSIS — J4540 Moderate persistent asthma, uncomplicated: Secondary | ICD-10-CM

## 2016-12-26 DIAGNOSIS — J3089 Other allergic rhinitis: Secondary | ICD-10-CM

## 2016-12-26 DIAGNOSIS — J302 Other seasonal allergic rhinitis: Secondary | ICD-10-CM | POA: Diagnosis not present

## 2016-12-26 NOTE — Progress Notes (Signed)
FOLLOW UP  Date of Service/Encounter:  12/26/16   Assessment:   Moderate persistent asthma, uncomplicated  Perennial and seasonal allergic rhinitis (grass, trees, mold, dust mite, and cat)   Plan/Recommendations:   1. Moderate persistent asthma, uncomplicated - Lung function looked great today. - We will make Symbicort 160/4.5 your medication to use during respiratory flares and keep you on the Symbicort 80/4.5 as a regular medication.  - Daily controller medication(s): Symbicort 80/4.5 two puffs twice daily with spacer - Rescue medications: ProAir 4 puffs every 4-6 hours as needed - Changes during respiratory infections or worsening symptoms: change to Symbicort 80/4.5 2 puffs twice daily for ONE TO TWO WEEKS. - Asthma control goals:  * Full participation in all desired activities (may need albuterol before activity) * Albuterol use two time or less a week on average (not counting use with activity) * Cough interfering with sleep two time or less a month * Oral steroids no more than once a year * No hospitalizations  2. Perennial allergic rhinitis (grass, trees, mold, dust mite, and cat) - Continue with cetirizine 10mg  daily. - Continue with Flonase 1-2 sprays per nostril daily as needed.   3. Return in about 6 months (around 06/28/2017).    Subjective:   Valerie Henderson is a 51 y.o. female presenting today for follow up of  Chief Complaint  Patient presents with  . Asthma    doing good until allergies started to flare    Christophe Louis has a history of the following: Patient Active Problem List   Diagnosis Date Noted  . Elevated BP 06/10/2015  . Post-operative state 10/22/2013  . Endometriosis of pelvis 09/16/2013  . Cervical os stenosis 08/06/2013  . Menorrhagia 08/06/2013  . Sterilization 01/14/2013  . Pre-op testing 12/25/2012  . Fibromyalgia 12/16/2012  . Irregular bleeding 12/05/2012  . Hypothyroid 12/05/2012  . Constipation 12/05/2012    History  obtained from: chart review and patient.  Valerie Henderson Primary Care Provider is Sharilyn Sites, MD.     Valerie Henderson is a 51 y.o. female presenting for a follow up visit. She was last seen in February 2018. At that time, she was doing so well that we decided to step down her therapy to Symbicort 80/4.5 two puffs twice daily in conjunction with ProAir as needed. For her allergic rhinitis, we continued cetirizine 10mg  daily and Flonase one spray per nostril daily. Her last environmental allergy testing was positive to grass, trees, mold, dust mite, and cat.  Since the last visit, she has mostly done well. She did have to go up to the Symbicort 160/4.5 around two weeks ago. This is her bad time of the year and she was under her house fixing a water leak. Symptoms have improved with the use of the higher dose Symbicort. She did not need prednisone and did not go to the ED for her symptoms. She does like being on the lower dose Symbicort since she really just wants to take "the medication that she needs", but she thinks that she would be better controlled if she had a Symbicort 160/4.5.   She is on cetirizine 10mg  daily. She only uses the nose spray as needed. The fall and spring are the worst times for her allergies. She overall is happy with how well controlled her atopic disease is at this time.   Otherwise, there have been no changes to her past medical history, surgical history, family history, or social history. Her brother is doing better  now following his wife's passing earlier in the year. 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 She does endorse increased symptoms of her asthma when she is working around the [employer].    Review of Systems: a 14-point review of systems is pertinent for what is mentioned in HPI.  Otherwise, all other systems were negative. Constitutional: negative other than that listed in the HPI Eyes: negative other than that listed in the  HPI Ears, nose, mouth, throat, and face: negative other than that listed in the HPI Respiratory: negative other than that listed in the HPI Cardiovascular: negative other than that listed in the HPI Gastrointestinal: negative other than that listed in the HPI Genitourinary: negative other than that listed in the HPI Integument: negative other than that listed in the HPI Hematologic: negative other than that listed in the HPI Musculoskeletal: negative other than that listed in the HPI Neurological: negative other than that listed in the HPI Allergy/Immunologic: negative other than that listed in the HPI    Objective:   Blood pressure 128/80, pulse 72, resp. rate 16, last menstrual period 08/15/2013. There is no height or weight on file to calculate BMI.   Physical Exam:  General: Alert, interactive, in no acute distress. Very pleasant and talkative.  Eyes: No conjunctival injection present on the right, No conjunctival injection present on the left, PERRL bilaterally, No discharge on the right, No discharge on the left and No Horner-Trantas dots present Ears: Right TM pearly gray with normal light reflex, Left TM pearly gray with normal light reflex, Right TM intact without perforation and Left TM intact without perforation.  Nose/Throat: External nose within normal limits and septum midline, turbinates edematous and pale with clear discharge, post-pharynx mildly erythematous without cobblestoning in the posterior oropharynx. Tonsils 2+ without exudates Neck: Supple without thyromegaly. Lungs: Clear to auscultation without wheezing, rhonchi or rales. No increased work of breathing. CV: Normal S1/S2, no murmurs. Capillary refill <2 seconds.  Skin: Warm and dry, without lesions or rashes. Neuro:   Grossly intact. No focal deficits appreciated. Responsive to questions.   Diagnostic studies:   Spirometry: results abnormal (FEV1: 1.45/67%, FVC: 1.76/71%, FEV1/FVC: 80%).    Spirometry  consistent with possible restrictive disease. Overall her values are slightly lower than the last visit, but clinically she was stable. Therefore we did not give any albuterol treatments.   Allergy Studies: none      Salvatore Marvel, MD [facility]

## 2016-12-26 NOTE — Patient Instructions (Addendum)
1. Moderate persistent asthma, uncomplicated - Lung function looked great today. - We will make Symbicort 160/4.5 your medication to use during respiratory flares and keep you on the Symbicort 80/4.5 as a regular medication.  - Daily controller medication(s): Symbicort 80/4.5 two puffs twice daily with spacer - Rescue medications: ProAir 4 puffs every 4-6 hours as needed - Changes during respiratory infections or worsening symptoms: change to Symbicort 80/4.5 2 puffs twice daily for ONE TO TWO WEEKS. - Asthma control goals:  * Full participation in all desired activities (may need albuterol before activity) * Albuterol use two time or less a week on average (not counting use with activity) * Cough interfering with sleep two time or less a month * Oral steroids no more than once a year * No hospitalizations  2. Perennial allergic rhinitis (grass, trees, mold, dust mite, and cat) - Continue with cetirizine 10mg  daily. - Continue with Flonase 1-2 sprays per nostril daily as needed.   3. Return in about 6 months (around 06/28/2017).   Please inform us of any Emergency Department visits, hospitalizations, or changes in symptoms. Call us before going to the ED for breathing or allergy symptoms since we might be able to fit you in for a sick visit. Feel free to contact us anytime with any questions, problems, or concerns.  It was a pleasure to see you again today! Enjoy the rest of your summer!   Websites that have reliable patient information: 1. American Academy of Asthma, Allergy, and Immunology: www.aaaai.org 2. Food Allergy Research and Education (FARE): foodallergy.org 3. Mothers of Asthmatics: http://www.asthmacommunitynetwork.org 4. American College of Allergy, Asthma, and Immunology: www.acaai.org   Election Day is coming up on Tuesday, November 6th! Make your voice heard! Register to vote at vote.org!

## 2017-02-01 ENCOUNTER — Ambulatory Visit (INDEPENDENT_AMBULATORY_CARE_PROVIDER_SITE_OTHER): Payer: 59 | Admitting: Adult Health

## 2017-02-01 ENCOUNTER — Encounter: Payer: Self-pay | Admitting: Adult Health

## 2017-02-01 ENCOUNTER — Other Ambulatory Visit (HOSPITAL_COMMUNITY)
Admission: RE | Admit: 2017-02-01 | Discharge: 2017-02-01 | Disposition: A | Discharge status: Home / Self Care | Payer: 59 | Source: Ambulatory Visit | Attending: Adult Health | Admitting: Adult Health

## 2017-02-01 VITALS — BP 120/80 | HR 74 | Ht 59.4 in | Wt 184.0 lb

## 2017-02-01 DIAGNOSIS — Z1212 Encounter for screening for malignant neoplasm of rectum: Secondary | ICD-10-CM | POA: Diagnosis not present

## 2017-02-01 DIAGNOSIS — Z01419 Encounter for gynecological examination (general) (routine) without abnormal findings: Secondary | ICD-10-CM | POA: Diagnosis not present

## 2017-02-01 DIAGNOSIS — N898 Other specified noninflammatory disorders of vagina: Secondary | ICD-10-CM | POA: Insufficient documentation

## 2017-02-01 DIAGNOSIS — Z1151 Encounter for screening for human papillomavirus (HPV): Secondary | ICD-10-CM | POA: Insufficient documentation

## 2017-02-01 DIAGNOSIS — Z1211 Encounter for screening for malignant neoplasm of colon: Secondary | ICD-10-CM | POA: Diagnosis not present

## 2017-02-01 LAB — HEMOCCULT GUIAC POC 1CARD (OFFICE): FECAL OCCULT BLD: NEGATIVE

## 2017-02-01 NOTE — Progress Notes (Signed)
Patient ID: Valerie Henderson, female   DOB: [birth year], 51 y.o.   MRN: [id number] History of Present Illness:  Valerie Henderson is a 51 year old white female in for pelvic exam and pap, she is sp [name] and had physical with PCP and labs, and had virtual colonoscopy in [name] and it was negative.She requests breasts exam. PCP is Dr Valerie Henderson.   Current Medications, Allergies, Past Medical History, Past Surgical History, Family History and Social History were reviewed in [employer] record.     Review of Systems: Patient denies any headaches, hearing loss, fatigue, blurred vision, shortness of breath, chest pain, abdominal pain, problems with bowel movements, urination, or intercourse. No joint pain or mood swings. +vaginal dryness    Physical Exam:BP 120/80 (BP Location: Left Arm, Patient Position: Sitting, Cuff Size: Small)   Pulse 74   Ht 4' 11.4" (1.509 m)   Wt 184 lb (83.5 kg)   LMP 08/15/2013 Comment: [name]  BMI 36.66 kg/m  General:  Well developed, well nourished, no acute distress Skin:  Warm and dry Neck:  Midline trachea, normal thyroid, good ROM, no lymphadenopathy Lungs; Clear to auscultation bilaterally Breast:  No dominant palpable mass, retraction, or nipple discharge Cardiovascular: Regular rate and rhythm Abdomen:  Soft, non tender, no hepatosplenomegaly Pelvic:  External genitalia is normal in appearance, no lesions.  The vagina is normal in appearance. Urethra has no lesions or masses. The cervix is bulbous.  Uterus is absent.  No adnexal masses or tenderness noted.Bladder is non tender, no masses felt. Rectal: Good sphincter tone, no polyps, or hemorrhoids felt.  Hemoccult negative. Extremities/musculoskeletal:  No swelling or varicosities noted, no clubbing or cyanosis Psych:  No mood changes, alert and cooperative,seems happy PHQ 2 scoe 0.  Impression: 1. Encounter for gynecological examination with Papanicolaou smear of cervix   2. Vaginal dryness   3.  Screening for colorectal cancer       Plan: Pap in 3 years if normal  1 year gyn exam Mammogram yearly Labs with PCP Colonoscopy per GI  Try luvena, 1 sample given Use astro glide with sex

## 2017-02-01 NOTE — Patient Instructions (Addendum)
1 year gyn exam Mammogram yearly Labs with PCP Colonoscopy per GI  Try luvena

## 2017-02-01 NOTE — Addendum Note (Signed)
Addended by: Diona Fanti A on: 02/01/2017 11:46 AM   Modules accepted: Orders

## 2017-02-05 LAB — CYTOLOGY - PAP
ADEQUACY: ABSENT
Diagnosis: NEGATIVE
HPV: NOT DETECTED

## 2017-02-13 DIAGNOSIS — Z23 Encounter for immunization: Secondary | ICD-10-CM | POA: Diagnosis not present

## 2017-03-27 DIAGNOSIS — E063 Autoimmune thyroiditis: Secondary | ICD-10-CM | POA: Diagnosis not present

## 2017-03-27 DIAGNOSIS — I10 Essential (primary) hypertension: Secondary | ICD-10-CM | POA: Diagnosis not present

## 2017-05-24 DIAGNOSIS — B351 Tinea unguium: Secondary | ICD-10-CM | POA: Diagnosis not present

## 2017-05-24 DIAGNOSIS — G4700 Insomnia, unspecified: Secondary | ICD-10-CM | POA: Diagnosis not present

## 2017-06-01 DIAGNOSIS — J069 Acute upper respiratory infection, unspecified: Secondary | ICD-10-CM | POA: Diagnosis not present

## 2017-06-01 DIAGNOSIS — Z6838 Body mass index (BMI) 38.0-38.9, adult: Secondary | ICD-10-CM | POA: Diagnosis not present

## 2017-06-26 ENCOUNTER — Encounter: Payer: Self-pay | Admitting: Allergy & Immunology

## 2017-06-26 ENCOUNTER — Ambulatory Visit: Payer: 59 | Admitting: Allergy & Immunology

## 2017-06-26 VITALS — BP 128/80 | HR 72 | Resp 16 | Wt 189.6 lb

## 2017-06-26 DIAGNOSIS — J3089 Other allergic rhinitis: Secondary | ICD-10-CM | POA: Diagnosis not present

## 2017-06-26 DIAGNOSIS — J302 Other seasonal allergic rhinitis: Secondary | ICD-10-CM

## 2017-06-26 DIAGNOSIS — J4540 Moderate persistent asthma, uncomplicated: Secondary | ICD-10-CM | POA: Diagnosis not present

## 2017-06-26 NOTE — Patient Instructions (Addendum)
1. Moderate persistent asthma, uncomplicated - Lung function looked slightly better today. - We will make things easier and change to the high dose Symbicort only.  - Daily controller medication(s): Symbicort 160/4.66mcg two puffs once daily - Prior to physical activity: ProAir 2 puffs 10-15 minutes before physical activity. - Rescue medications: ProAir 4 puffs every 4-6 hours as needed - Changes during respiratory infections or worsening symptoms: Increase Symbicort 160/4.5 to 2 puffs twice daily for TWO WEEKS. - Asthma control goals:  * Full participation in all desired activities (may need albuterol before activity) * Albuterol use two time or less a week on average (not counting use with activity) * Cough interfering with sleep two time or less a month * Oral steroids no more than once a year * No hospitalizations  2. Perennial allergic rhinitis (grass, trees, mold, dust mite, and cat) - Continue with cetirizine 10mg  daily. - Continue with Flonase 1-2 sprays per nostril daily as needed.   3. Return in about 6 months (around 12/24/2017).   Please inform us of any Emergency Department visits, hospitalizations, or changes in symptoms. Call us before going to the ED for breathing or allergy symptoms since we might be able to fit you in for a sick visit. Feel free to contact us anytime with any questions, problems, or concerns.  It was a pleasure to see you again today!   Websites that have reliable patient information: 1. American Academy of Asthma, Allergy, and Immunology: www.aaaai.org 2. Food Allergy Research and Education (FARE): foodallergy.org 3. Mothers of Asthmatics: http://www.asthmacommunitynetwork.org 4. American College of Allergy, Asthma, and Immunology: www.acaai.org

## 2017-06-26 NOTE — Progress Notes (Signed)
FOLLOW UP   Date of Service/Encounter:  06/26/17   Assessment:   Moderate persistent asthma, uncomplicated  Seasonal and perennial allergic rhinitis (grass, trees, mold, dust mite, and cat)   Asthma Reportables:  Severity: moderate persistent  Risk: low Control: well controlled   Plan/Recommendations:   1. Moderate persistent asthma, uncomplicated - Lung function looked slightly better today. - We will make things easier and change to the high dose Symbicort only.  - Daily controller medication(s): Symbicort 160/4.59mcg two puffs once daily - Prior to physical activity: ProAir 2 puffs 10-15 minutes before physical activity. - Rescue medications: ProAir 4 puffs every 4-6 hours as needed - Changes during respiratory infections or worsening symptoms: Increase Symbicort 160/4.5 to 2 puffs twice daily for TWO WEEKS. - Asthma control goals:  * Full participation in all desired activities (may need albuterol before activity) * Albuterol use two time or less a week on average (not counting use with activity) * Cough interfering with sleep two time or less a month * Oral steroids no more than once a year * No hospitalizations  2. Perennial allergic rhinitis (grass, trees, mold, dust mite, and cat) - Continue with cetirizine 10mg  daily. - Continue with Flonase 1-2 sprays per nostril daily as needed.   3. Return in about 6 months (around 12/24/2017).   Subjective:   Valerie Henderson is a 52 y.o. female presenting today for follow up of  Chief Complaint  Patient presents with  . Asthma    Recent flare with URI during past month.      . Allergic Rhinitis     Valerie Henderson has a history of the following: Patient Active Problem List   Diagnosis Date Noted  . Vaginal dryness 02/01/2017  . Seasonal and perennial allergic rhinitis 12/26/2016  . Moderate persistent asthma, uncomplicated 17/61/6073  . Elevated BP 06/10/2015  . Post-operative state 10/22/2013  . Endometriosis of  pelvis 09/16/2013  . Cervical os stenosis 08/06/2013  . Menorrhagia 08/06/2013  . Screening for colorectal cancer 01/14/2013  . Pre-op testing 12/25/2012  . Fibromyalgia 12/16/2012  . Irregular bleeding 12/05/2012  . Hypothyroid 12/05/2012  . Constipation 12/05/2012    History obtained from: chart review and patient.  Valerie Henderson Primary Care Provider is Sharilyn Sites, MD.     Valerie Henderson is a 52 y.o. female presenting for a follow up visit. She was last seen in August 2018. At that time, she was doing well and was interested in decreasing her medications. Therefore we stepped down therapy from Symbicort 160 to Symbicort 80 two puffs BID. She has a history of perennial allergic rhinitis with sensitizations to grasses, trees, mold, dust mite, and cat. We continue her on cetirizine 10mg  daily as well as Flonase as needed.   Since the last visit, Valerie Henderson has mostly done well. She remains on the low dose Symbicort, but has had to increase to the higher dose Symbicort on two occasions for around two weeks each. She does tell me that the pharmacy is slightly confused about it and she requires multiple phone calls to get this straightened out. Valerie Henderson asthma has been well controlled. She has not required rescue medication, experienced nocturnal awakenings due to lower respiratory symptoms, nor have activities of daily living been limited. She has required no Emergency Department or Urgent Care visits for her asthma. She has required zero courses of systemic steroids for asthma exacerbations since the last visit. ACT score today is 20, indicating excellent asthma symptom control.  She was diagnosed with bronchitis within the last few months and was placed on antibiotics. Otherwise no infectious treatments whatsoever. She remains on her cetirizine and fluticasone nasal sprays. She is wondering what her last testing showed and is interested in getting this printed off.   Otherwise, there have been no changes to  her past medical history, surgical history, family history, or social history.     Review of Systems: a 14-point review of systems is pertinent for what is mentioned in HPI.  Otherwise, all other systems were negative. Constitutional: negative other than that listed in the HPI Eyes: negative other than that listed in the HPI Ears, nose, mouth, throat, and face: negative other than that listed in the HPI Respiratory: negative other than that listed in the HPI Cardiovascular: negative other than that listed in the HPI Gastrointestinal: negative other than that listed in the HPI Genitourinary: negative other than that listed in the HPI Integument: negative other than that listed in the HPI Hematologic: negative other than that listed in the HPI Musculoskeletal: negative other than that listed in the HPI Neurological: negative other than that listed in the HPI Allergy/Immunologic: negative other than that listed in the HPI    Objective:   Blood pressure 128/80, pulse 72, resp. rate 16, weight 189 lb 9.6 oz (86 kg), last menstrual period 08/15/2013, SpO2 98 %. Body mass index is 37.78 kg/m.   Physical Exam:  General: Alert, interactive, in no acute distress. Pleasant.  Eyes: No conjunctival injection bilaterally, no discharge on the right, no discharge on the left and no Horner-Trantas dots present. PERRL bilaterally. EOMI without pain. No photophobia.  Ears: Right TM pearly gray with normal light reflex, Left TM pearly gray with normal light reflex, Right TM intact without perforation and Left TM intact without perforation.  Nose/Throat: External nose within normal limits, nasal crease present and septum midline. Turbinates edematous and pale with clear discharge. Posterior oropharynx mildly erythematous without cobblestoning in the posterior oropharynx. Tonsils 2+ without exudates.  Tongue without thrush. Lungs: Clear to auscultation without wheezing, rhonchi or rales. No increased work  of breathing. CV: Normal S1/S2. No murmurs. Capillary refill <2 seconds.  Skin: Warm and dry, without lesions or rashes. Neuro:   Grossly intact. No focal deficits appreciated. Responsive to questions.  Diagnostic studies:   Spirometry: results normal (FEV1: 1.44/70%, FVC: 1.89/69%, FEV1/FVC: 75%).    Spirometry consistent with normal pattern.   Allergy Studies: none     Salvatore Marvel, MD [facility]

## 2017-07-19 ENCOUNTER — Telehealth: Payer: Self-pay | Admitting: Allergy & Immunology

## 2017-07-19 MED ORDER — BUDESONIDE-FORMOTEROL FUMARATE 160-4.5 MCG/ACT IN AERO: 2.0000 | INHALATION_SPRAY | Freq: Two times a day (BID) | RESPIRATORY_TRACT | 3 refills | Status: DC

## 2017-07-19 NOTE — Telephone Encounter (Signed)
Refills sent in

## 2017-07-19 NOTE — Telephone Encounter (Signed)
Pt called and needs to have Symbicort called into [facility] in [place]. [phone].

## 2017-07-19 NOTE — Telephone Encounter (Signed)
Patient advised.

## 2017-11-11 ENCOUNTER — Other Ambulatory Visit: Payer: Self-pay | Admitting: Allergy & Immunology

## 2017-11-21 DIAGNOSIS — I10 Essential (primary) hypertension: Secondary | ICD-10-CM | POA: Diagnosis not present

## 2017-11-21 DIAGNOSIS — Z0001 Encounter for general adult medical examination with abnormal findings: Secondary | ICD-10-CM | POA: Diagnosis not present

## 2017-11-21 DIAGNOSIS — R7309 Other abnormal glucose: Secondary | ICD-10-CM | POA: Diagnosis not present

## 2017-11-21 DIAGNOSIS — E039 Hypothyroidism, unspecified: Secondary | ICD-10-CM | POA: Diagnosis not present

## 2017-11-21 DIAGNOSIS — E782 Mixed hyperlipidemia: Secondary | ICD-10-CM | POA: Diagnosis not present

## 2017-11-26 ENCOUNTER — Other Ambulatory Visit (HOSPITAL_COMMUNITY): Payer: Self-pay | Admitting: Family Medicine

## 2017-11-26 DIAGNOSIS — Z1231 Encounter for screening mammogram for malignant neoplasm of breast: Secondary | ICD-10-CM

## 2017-11-28 ENCOUNTER — Ambulatory Visit (HOSPITAL_COMMUNITY)
Admission: RE | Admit: 2017-11-28 | Discharge: 2017-11-28 | Disposition: A | Discharge status: Home / Self Care | Payer: 59 | Source: Ambulatory Visit | Attending: Family Medicine | Admitting: Family Medicine

## 2017-11-28 DIAGNOSIS — Z1231 Encounter for screening mammogram for malignant neoplasm of breast: Secondary | ICD-10-CM

## 2017-12-25 ENCOUNTER — Encounter: Payer: Self-pay | Admitting: Allergy & Immunology

## 2017-12-25 ENCOUNTER — Ambulatory Visit: Payer: 59 | Admitting: Allergy & Immunology

## 2017-12-25 VITALS — BP 118/70 | HR 74 | Resp 16

## 2017-12-25 DIAGNOSIS — J3089 Other allergic rhinitis: Secondary | ICD-10-CM

## 2017-12-25 DIAGNOSIS — J4540 Moderate persistent asthma, uncomplicated: Secondary | ICD-10-CM

## 2017-12-25 DIAGNOSIS — J302 Other seasonal allergic rhinitis: Secondary | ICD-10-CM

## 2017-12-25 NOTE — Patient Instructions (Addendum)
1. Moderate persistent asthma, uncomplicated - Lung function looked better today.  - Try changing Symbicort to two puffs once daily instead.  - Daily controller medication(s): Symbicort 160/4.52mcg two puffs once daily - Prior to physical activity: ProAir 2 puffs 10-15 minutes before physical activity. - Rescue medications: ProAir 4 puffs every 4-6 hours as needed - Changes during respiratory infections or worsening symptoms: Increase Symbicort 160/4.5 to 2 puffs twice daily for ONE TO TWO WEEKS. - Asthma control goals:  * Full participation in all desired activities (may need albuterol before activity) * Albuterol use two time or less a week on average (not counting use with activity) * Cough interfering with sleep two time or less a month * Oral steroids no more than once a year * No hospitalizations  2. Perennial allergic rhinitis (grass, trees, mold, dust mite, and cat) - Continue with cetirizine 10mg  daily. - Continue with Flonase 1-2 sprays per nostril daily as needed.   3. Return in about 6 months (around 06/27/2018).   Please inform us of any Emergency Department visits, hospitalizations, or changes in symptoms. Call us before going to the ED for breathing or allergy symptoms since we might be able to fit you in for a sick visit. Feel free to contact us anytime with any questions, problems, or concerns.  It was a pleasure to see you again today!   Websites that have reliable patient information: 1. American Academy of Asthma, Allergy, and Immunology: www.aaaai.org 2. Food Allergy Research and Education (FARE): foodallergy.org 3. Mothers of Asthmatics: http://www.asthmacommunitynetwork.org 4. American College of Allergy, Asthma, and Immunology: www.acaai.org

## 2017-12-25 NOTE — Progress Notes (Signed)
FOLLOW UP  Date of Service/Encounter:  12/25/17   Assessment:   Moderate persistent asthma, uncomplicated  Seasonal and perennial allergic rhinitis (grass, trees, mold, dust mite, and cat)  Hypothyroidism    Asthma Reportables:  Severity: moderate persistent  Risk: low Control: well controlled  Plan/Recommendations:   1. Moderate persistent asthma, uncomplicated - Lung function looked better today.  - Try changing Symbicort to two puffs once daily instead.  - Daily controller medication(s): Symbicort 160/4.65mcg two puffs once daily - Prior to physical activity: ProAir 2 puffs 10-15 minutes before physical activity. - Rescue medications: ProAir 4 puffs every 4-6 hours as needed - Changes during respiratory infections or worsening symptoms: Increase Symbicort 160/4.5 to 2 puffs twice daily for ONE TO TWO WEEKS. - Asthma control goals:  * Full participation in all desired activities (may need albuterol before activity) * Albuterol use two time or less a week on average (not counting use with activity) * Cough interfering with sleep two time or less a month * Oral steroids no more than once a year * No hospitalizations  2. Perennial allergic rhinitis (grass, trees, mold, dust mite, and cat) - Continue with cetirizine 10mg  daily. - Continue with Flonase 1-2 sprays per nostril daily as needed.   3. Return in about 6 months (around 06/27/2018).   Subjective:   Valerie Henderson is a 52 y.o. female presenting today for follow up of  Chief Complaint  Patient presents with  . Asthma    Valerie Henderson has a history of the following: Patient Active Problem List   Diagnosis Date Noted  . Vaginal dryness 02/01/2017  . Seasonal and perennial allergic rhinitis 12/26/2016  . Moderate persistent asthma, uncomplicated 01/60/1093  . Elevated BP 06/10/2015  . Post-operative state 10/22/2013  . Endometriosis of pelvis 09/16/2013  . Cervical os stenosis 08/06/2013  . Menorrhagia  08/06/2013  . Screening for colorectal cancer 01/14/2013  . Pre-op testing 12/25/2012  . Fibromyalgia 12/16/2012  . Irregular bleeding 12/05/2012  . Hypothyroid 12/05/2012  . Constipation 12/05/2012    History obtained from: chart review and patient.  Valerie Henderson Primary Care Provider is Valerie Sites, MD.     Valerie Henderson is a 52 y.o. female presenting for a follow up visit. She was last seen in February 2019. At that time, lung function looked better. We changed to high dose Symbicort only two puffs once daily with ProAir as needed during flares. For her rhinitis, we continued her on cetirizine and fluticasone.   Since the last visit, she has done well. They are renovating their house (mostly themselves) so this has caused some problems with her breathing.   Asthma/Respiratory Symptom History: She remains on one puff twice daily. She might do the two puffs once daily at times. Valerie Henderson's asthma has been well controlled. She has not required rescue medication, experienced nocturnal awakenings due to lower respiratory symptoms, nor have activities of daily living been limited. She has required no Emergency Department or Urgent Care visits for her asthma. She has required zero courses of systemic steroids for asthma exacerbations since the last visit. ACT score today is 22, indicating excellent asthma symptom control.   Allergic Rhinitis Symptom History: She remains on the cetirizine as well as the fluticasone. She has had no problems with sinus infections at all. She does not remember that the last time that she needed prednisone or antibiotics.   She does have a history of hypothyroidism which is stable on levothyroxine. She recently had  her dose increased to 174mcg. Valerie Henderson lives at home with her husband. There was one outside dog. Currently she works as a Theme park manager.   Otherwise, there have been no changes to her past medical history, surgical history, family history, or social history.    Review  of Systems: a 14-point review of systems is pertinent for what is mentioned in HPI.  Otherwise, all other systems were negative. Constitutional: negative other than that listed in the HPI Eyes: negative other than that listed in the HPI Ears, nose, mouth, throat, and face: negative other than that listed in the HPI Respiratory: negative other than that listed in the HPI Cardiovascular: negative other than that listed in the HPI Gastrointestinal: negative other than that listed in the HPI Genitourinary: negative other than that listed in the HPI Integument: negative other than that listed in the HPI Hematologic: negative other than that listed in the HPI Musculoskeletal: negative other than that listed in the HPI Neurological: negative other than that listed in the HPI Allergy/Immunologic: negative other than that listed in the HPI    Objective:   Blood pressure 118/70, pulse 74, resp. rate 16, last menstrual period 08/15/2013, SpO2 98 %. There is no height or weight on file to calculate BMI.   Physical Exam:  General: Alert, interactive, in no acute distress. Pleasant female.  Eyes: No conjunctival injection bilaterally, no discharge on the right, no discharge on the left, no Horner-Trantas dots present and allergic shiners present bilaterally. PERRL bilaterally. EOMI without pain. No photophobia.  Ears: Right TM pearly gray with normal light reflex, Left TM pearly gray with normal light reflex, Right TM intact without perforation and Left TM intact without perforation.  Nose/Throat: External nose within normal limits and nasal crease present. Turbinates edematous without discharge. Posterior oropharynx mildly erythematous without cobblestoning in the posterior oropharynx. Tonsils 2+ without exudates.  Tongue without thrush. Lungs: Clear to auscultation without wheezing, rhonchi or rales. No increased work of breathing. CV: Normal S1/S2. No murmurs. Capillary refill <2 seconds.   Skin: Warm and dry, without lesions or rashes. Neuro:   Grossly intact. No focal deficits appreciated. Responsive to questions.  Diagnostic studies:   Spirometry: results normal (FEV1: 1.49/73%, FVC: 1.95/72%, FEV1/FVC: 76%).    Spirometry consistent with normal pattern.   Allergy Studies: none       Salvatore Marvel, MD  [facility]

## 2018-02-25 DIAGNOSIS — E039 Hypothyroidism, unspecified: Secondary | ICD-10-CM | POA: Diagnosis not present

## 2018-03-05 DIAGNOSIS — R42 Dizziness and giddiness: Secondary | ICD-10-CM | POA: Diagnosis not present

## 2018-03-05 DIAGNOSIS — R05 Cough: Secondary | ICD-10-CM | POA: Diagnosis not present

## 2018-03-05 DIAGNOSIS — J069 Acute upper respiratory infection, unspecified: Secondary | ICD-10-CM | POA: Diagnosis not present

## 2018-04-11 DIAGNOSIS — C44519 Basal cell carcinoma of skin of other part of trunk: Secondary | ICD-10-CM | POA: Diagnosis not present

## 2018-04-11 DIAGNOSIS — L814 Other melanin hyperpigmentation: Secondary | ICD-10-CM | POA: Diagnosis not present

## 2018-04-11 DIAGNOSIS — X32XXXD Exposure to sunlight, subsequent encounter: Secondary | ICD-10-CM | POA: Diagnosis not present

## 2018-04-11 DIAGNOSIS — L570 Actinic keratosis: Secondary | ICD-10-CM | POA: Diagnosis not present

## 2018-04-11 DIAGNOSIS — C44719 Basal cell carcinoma of skin of left lower limb, including hip: Secondary | ICD-10-CM | POA: Diagnosis not present

## 2018-04-22 ENCOUNTER — Emergency Department (HOSPITAL_COMMUNITY): Payer: 59

## 2018-04-22 ENCOUNTER — Other Ambulatory Visit: Payer: Self-pay

## 2018-04-22 ENCOUNTER — Emergency Department (HOSPITAL_COMMUNITY)
Admission: EM | Admit: 2018-04-22 | Discharge: 2018-04-22 | Disposition: A | Discharge status: Home / Self Care | Payer: 59 | Attending: Emergency Medicine | Admitting: Emergency Medicine

## 2018-04-22 DIAGNOSIS — R0789 Other chest pain: Secondary | ICD-10-CM

## 2018-04-22 DIAGNOSIS — J45909 Unspecified asthma, uncomplicated: Secondary | ICD-10-CM | POA: Diagnosis not present

## 2018-04-22 DIAGNOSIS — Z6837 Body mass index (BMI) 37.0-37.9, adult: Secondary | ICD-10-CM | POA: Diagnosis not present

## 2018-04-22 DIAGNOSIS — Z87891 Personal history of nicotine dependence: Secondary | ICD-10-CM | POA: Insufficient documentation

## 2018-04-22 DIAGNOSIS — R072 Precordial pain: Secondary | ICD-10-CM | POA: Diagnosis not present

## 2018-04-22 DIAGNOSIS — Z79899 Other long term (current) drug therapy: Secondary | ICD-10-CM | POA: Insufficient documentation

## 2018-04-22 DIAGNOSIS — R079 Chest pain, unspecified: Secondary | ICD-10-CM | POA: Insufficient documentation

## 2018-04-22 DIAGNOSIS — E039 Hypothyroidism, unspecified: Secondary | ICD-10-CM | POA: Insufficient documentation

## 2018-04-22 LAB — CBC
HEMATOCRIT: 42.7 % (ref 36.0–46.0)
HEMOGLOBIN: 12.9 g/dL (ref 12.0–15.0)
MCH: 26.3 pg (ref 26.0–34.0)
MCHC: 30.2 g/dL (ref 30.0–36.0)
MCV: 87 fL (ref 80.0–100.0)
NRBC: 0 % (ref 0.0–0.2)
Platelets: 250 10*3/uL (ref 150–400)
RBC: 4.91 MIL/uL (ref 3.87–5.11)
RDW: 14.6 % (ref 11.5–15.5)
WBC: 10.2 10*3/uL (ref 4.0–10.5)

## 2018-04-22 LAB — BASIC METABOLIC PANEL
ANION GAP: 7 (ref 5–15)
BUN: 13 mg/dL (ref 6–20)
CHLORIDE: 103 mmol/L (ref 98–111)
CO2: 28 mmol/L (ref 22–32)
Calcium: 9.4 mg/dL (ref 8.9–10.3)
Creatinine, Ser: 0.57 mg/dL (ref 0.44–1.00)
GFR calc non Af Amer: >60 mL/min (ref >60)
GLUCOSE: 99 mg/dL (ref 70–99)
Potassium: 4.1 mmol/L (ref 3.5–5.1)
Sodium: 138 mmol/L (ref 135–145)

## 2018-04-22 LAB — I-STAT TROPONIN, ED: Troponin i, poc: 0 ng/mL (ref 0.00–0.08)

## 2018-04-22 LAB — D-DIMER, QUANTITATIVE: D-Dimer, Quant: 0.49 ug/mL-FEU (ref 0.00–0.50)

## 2018-04-22 MED ORDER — IBUPROFEN 400 MG PO TABS
400.0000 mg | ORAL_TABLET | Freq: Once | ORAL | Status: AC
  Administered 2018-04-22: 400 mg via ORAL
  Filled 2018-04-22: qty 1

## 2018-04-22 NOTE — ED Triage Notes (Signed)
Patient sent by [facility] for chest pain. Onset of symptoms Saturday. Patient denies other symptoms. EKG done at PCP and patient reports the physician told her "it was good." Per patient report PCP wanted workup for PE.

## 2018-04-22 NOTE — Discharge Instructions (Signed)
It was our pleasure to provide your ER care today - we hope that you feel better.  Take acetaminophen and/or ibuprofen as need.   Follow up with primary care doctor in the coming week.  For chest discomfort, also follow up with cardiologist in 1 week - call office to arrange follow up.  Return to ER if worse, new symptoms, fevers, recurrent/persistent chest pain, difficulty breathing, other concern.

## 2018-04-22 NOTE — ED Provider Notes (Signed)
[facility] EMERGENCY DEPARTMENT Provider Note   CSN: [id number] Arrival date & time: 04/22/18  1327     History   Chief Complaint Chief Complaint  Patient presents with  . Chest Pain    HPI Valerie Henderson is a 51 y.o. female.  Patient c/o pain to left chest/left mid/upper back for the past 2 days. Symptoms onset at rest, constant, dull, mild-moderate, non radiating. Symptoms occasionally worse w deep breath. Denies sob or nv. No diaphoresis. No exertional chest pain. No unusual doe. No leg pain or swelling. No recent surgery, prolonged travel or immobility. No hx dvt or pe. No fam hx premature cad. Denies chest wall injury or strain. No heartburn. Denies cough or uri symptoms.   The history is provided by the patient.  Chest Pain   Pertinent negatives include no abdominal pain, no cough, no fever, no headaches, no shortness of breath and no vomiting.    Past Medical History:  Diagnosis Date  . Anemia   . Asthma   . Complication of anesthesia   . Constipation 12/05/2012  . Elevated BP 06/10/2015  . Endometriosis   . Fibromyalgia 12/16/2012  . GERD (gastroesophageal reflux disease)   . Hypothyroid 12/05/2012  . Hypothyroidism   . Insomnia   . Irregular bleeding 12/05/2012   Endometrial  ablation 9/14  . PONV (postoperative nausea and vomiting)     Patient Active Problem List   Diagnosis Date Noted  . Vaginal dryness 02/01/2017  . Seasonal and perennial allergic rhinitis 12/26/2016  . Moderate persistent asthma without complication 40/98/1191  . Elevated BP 06/10/2015  . Post-operative state 10/22/2013  . Endometriosis of pelvis 09/16/2013  . Cervical os stenosis 08/06/2013  . Menorrhagia 08/06/2013  . Screening for colorectal cancer 01/14/2013  . Pre-op testing 12/25/2012  . Fibromyalgia 12/16/2012  . Irregular bleeding 12/05/2012  . Hypothyroid 12/05/2012  . Constipation 12/05/2012    Past Surgical History:  Procedure Laterality Date  . CHOLECYSTECTOMY    .  DILITATION & CURRETTAGE/HYSTROSCOPY WITH THERMACHOICE ABLATION N/A 01/14/2013   Procedure: DILATATION & CURETTAGE/HYSTEROSCOPY WITH THERMACHOICE ABLATION;  Surgeon: Jonnie Kind, MD;  Location: [facility];  Service: Gynecology;  Laterality: N/A;  8 ml D5W total in, 8 ml D5W total out; 86-88 degrees Celcius; 8 minutes and 39 seconds total time  . LAPAROSCOPIC BILATERAL SALPINGECTOMY Bilateral 01/14/2013   Procedure: LAPAROSCOPIC BILATERAL SALPINGECTOMY;  Surgeon: Jonnie Kind, MD;  Location: [facility];  Service: Gynecology;  Laterality: Bilateral;  . OOPHORECTOMY Bilateral 09/16/2013   Procedure: OOPHORECTOMY;  Surgeon: Jonnie Kind, MD;  Location: [facility];  Service: Gynecology;  Laterality: Bilateral;  . SUPRACERVICAL ABDOMINAL HYSTERECTOMY N/A 09/16/2013   Procedure: HYSTERECTOMY SUPRACERVICAL ABDOMINAL;  Surgeon: Jonnie Kind, MD;  Location: [facility];  Service: Gynecology;  Laterality: N/A;     OB History    Gravida  1   Para  1   Term      Preterm      AB      Living  1     SAB      TAB      Ectopic      Multiple      Live Births  1            Home Medications    Prior to Admission medications   Medication Sig Start Date End Date Taking? Authorizing Provider  amLODipine (NORVASC) 5 MG tablet Take 5 mg by mouth daily. 06/19/17   [provider]  cholecalciferol (VITAMIN D) 1000 units tablet Take 1,000 Units by mouth daily.    [provider]  FOLIC ACID PO Take by mouth daily.    [provider]  levothyroxine (SYNTHROID, LEVOTHROID) 150 MCG tablet TK 1 T PO D 12/15/17   [provider]  MAGNESIUM PO Take by mouth daily.    [provider]  Multiple Vitamin (MULTIVITAMIN) tablet Take 1 tablet by mouth daily.    [provider]  SYMBICORT 160-4.5 MCG/ACT inhaler INHALE 2 PUFFS INTO THE LUNGS TWICE DAILY 11/12/17   Valentina Shaggy, MD  UNABLE TO FIND Vit C gummies-takes 2 daily    [provider]    VENTOLIN HFA 108 (90 Base) MCG/ACT inhaler 2 puffs every 6 (six) hours as needed.  05/15/17   [provider]  zolpidem (AMBIEN) 5 MG tablet take 1 tablet by mouth at bedtime if needed 05/24/17   [provider]    Family History Family History  Problem Relation Age of Onset  . Heart disease Mother        heart attack  . Hypertension Mother   . Heart disease Father        heart attack  . Hypertension Father   . Cancer Maternal Aunt        breast  . Cancer Maternal Grandmother        uterine  . Cancer Maternal Grandfather        lung  . Cancer Paternal Grandmother        uterine  . Hypertension Brother   . Hypertension Sister   . Hypertension Other   . Colon cancer Neg Hx   . Allergic rhinitis Neg Hx   . Angioedema Neg Hx   . Asthma Neg Hx   . Atopy Neg Hx   . Eczema Neg Hx   . Immunodeficiency Neg Hx   . Urticaria Neg Hx     Social History Social History   Tobacco Use  . Smoking status: Former Smoker    Years: 2.50    Types: Cigarettes  . Smokeless tobacco: Never Used  . Tobacco comment: only used for a few years during her teens   Substance Use Topics  . Alcohol use: Yes    Comment: occassional  . Drug use: No     Allergies   Neosporin [neomycin-bacitracin zn-polymyx]   Review of Systems Review of Systems  Constitutional: Negative for fever.  HENT: Negative for sore throat.   Eyes: Negative for redness.  Respiratory: Negative for cough and shortness of breath.   Cardiovascular: Positive for chest pain. Negative for leg swelling.  Gastrointestinal: Negative for abdominal pain and vomiting.  Genitourinary: Negative for dysuria and flank pain.  Musculoskeletal: Negative for neck pain.  Skin: Negative for rash.  Neurological: Negative for headaches.  Hematological: Does not bruise/bleed easily.  Psychiatric/Behavioral: Negative for confusion.     Physical Exam Updated Vital Signs BP (!) 176/94 (BP Location: Right Arm)   Pulse 78    Temp 97.9 F (36.6 C) (Oral)   Resp 18   Ht 1.499 m (4\' 11" )   Wt 84.8 kg   LMP 08/15/2013 Comment: [place]  SpO2 97%   BMI 37.77 kg/m   Physical Exam Vitals signs and nursing note reviewed.  Constitutional:      Appearance: Normal appearance. She is well-developed.  HENT:     Head: Atraumatic.     Mouth/Throat:     Mouth: Mucous membranes are moist.  Eyes:  General: No scleral icterus.    Conjunctiva/sclera: Conjunctivae normal.  Neck:     Musculoskeletal: Normal range of motion and neck supple. No neck rigidity.     Trachea: No tracheal deviation.  Cardiovascular:     Rate and Rhythm: Regular rhythm.     Pulses: Normal pulses.     Heart sounds: Normal heart sounds. No murmur. No friction rub. No gallop.   Pulmonary:     Effort: Pulmonary effort is normal. No respiratory distress.     Breath sounds: Normal breath sounds.  Abdominal:     General: Abdomen is flat. Bowel sounds are normal. There is no distension.     Palpations: Abdomen is soft. There is no mass.     Tenderness: There is no abdominal tenderness.  Genitourinary:    Comments: No cva tenderness.  Musculoskeletal:        General: No swelling or tenderness.     Right lower leg: No edema.     Left lower leg: No edema.     Comments: C/T spine non tender, aligned, no step off. No focal back sts or point tenderness.   Skin:    General: Skin is warm and dry.     Findings: No rash.     Comments: No rash/skin lesions in area of pain.   Neurological:     Mental Status: She is alert.     Comments: Speech clear/fluent. Motor/sens grossly intact bil.   Psychiatric:        Mood and Affect: Mood normal.      ED Treatments / Results  Labs (all labs ordered are listed, but only abnormal results are displayed) Results for orders placed or performed during the hospital encounter of 04/22/18  CBC  Result Value Ref Range   WBC 10.2 4.0 - 10.5 K/uL   RBC 4.91 3.87 - 5.11 MIL/uL   Hemoglobin 12.9 12.0 - 15.0 g/dL    HCT 42.7 36.0 - 46.0 %   MCV 87.0 80.0 - 100.0 fL   MCH 26.3 26.0 - 34.0 pg   MCHC 30.2 30.0 - 36.0 g/dL   RDW 14.6 11.5 - 15.5 %   Platelets 250 150 - 400 K/uL   nRBC 0.0 0.0 - 0.2 %  Basic metabolic panel  Result Value Ref Range   Sodium 138 135 - 145 mmol/L   Potassium 4.1 3.5 - 5.1 mmol/L   Chloride 103 98 - 111 mmol/L   CO2 28 22 - 32 mmol/L   Glucose, Bld 99 70 - 99 mg/dL   BUN 13 6 - 20 mg/dL   Creatinine, Ser 0.57 0.44 - 1.00 mg/dL   Calcium 9.4 8.9 - 10.3 mg/dL   GFR calc non Af Amer >60 >60 mL/min   GFR calc Af Amer >60 >60 mL/min   Anion gap 7 5 - 15  D-dimer, quantitative (not at [facility])  Result Value Ref Range   D-Dimer, Quant 0.49 0.00 - 0.50 ug/mL-FEU  I-stat troponin, ED  Result Value Ref Range   Troponin i, poc 0.00 0.00 - 0.08 ng/mL   Comment 3           Dg Chest 2 View  Result Date: 04/22/2018 CLINICAL DATA:  LEFT side chest pain since Saturday EXAM: CHEST - 2 VIEW COMPARISON:  07/30/2013 FINDINGS: Enlargement of cardiac silhouette. Mediastinal contours and pulmonary vascularity normal. Minimal atelectasis or scarring at lingula. Lungs otherwise clear. No infiltrate, pleural effusion or pneumothorax. Bones demineralized. IMPRESSION: Enlargement of cardiac silhouette with minimal  atelectasis or scarring at lingula. Electronically Signed   By: Lavonia Dana M.D.   On: 04/22/2018 14:18    EKG EKG Interpretation  Date/Time:  Monday April 22 2018 13:46:08 EST Ventricular Rate:  106 PR Interval:    QRS Duration: 97 QT Interval:  306 QTC Calculation: 407 R Axis:   39 Text Interpretation:  Sinus tachycardia Nonspecific ST abnormality Baseline wander `mild st dep noted inf/lat on remote/prior ecg Confirmed by Lajean Saver [phone]) on 04/22/2018 1:57:09 PM   Radiology Dg Chest 2 View  Result Date: 04/22/2018 CLINICAL DATA:  LEFT side chest pain since Saturday EXAM: CHEST - 2 VIEW COMPARISON:  07/30/2013 FINDINGS: Enlargement of cardiac silhouette.  Mediastinal contours and pulmonary vascularity normal. Minimal atelectasis or scarring at lingula. Lungs otherwise clear. No infiltrate, pleural effusion or pneumothorax. Bones demineralized. IMPRESSION: Enlargement of cardiac silhouette with minimal atelectasis or scarring at lingula. Electronically Signed   By: Lavonia Dana M.D.   On: 04/22/2018 14:18    Procedures Procedures (including critical care time)  Medications Ordered in ED Medications - No data to display   Initial Impression / Assessment and Plan / ED Course  I have reviewed the triage vital signs and the nursing notes.  Pertinent labs & imaging results that were available during my care of the patient were reviewed by me and considered in my medical decision making (see chart for details).  Ecg. Cxr. Labs.  Reviewed nursing notes and prior charts for additional history.   Labs reviewed - after symptoms for past 48 hours, trop is normal, 0. Patients ddimer is also normal.  cxr reviewed -  No pna.   Motrin po.  Pt breathing comfortably on recheck. No increased wob. Hr 74, rr 16. Pulse ox 98% room air.  Pt indicates no current pain or discomfort.   Pts ED workup reassuring, symptoms resolved. Pt currently appears stable for d/c.     Final Clinical Impressions(s) / ED Diagnoses   Final diagnoses:  None    ED Discharge Orders    None       Lajean Saver, MD 04/22/18 1438

## 2018-05-02 ENCOUNTER — Other Ambulatory Visit (HOSPITAL_COMMUNITY): Payer: Self-pay | Admitting: Internal Medicine

## 2018-05-02 ENCOUNTER — Ambulatory Visit (HOSPITAL_COMMUNITY)
Admission: RE | Admit: 2018-05-02 | Discharge: 2018-05-02 | Disposition: A | Discharge status: Home / Self Care | Payer: 59 | Source: Ambulatory Visit | Attending: Internal Medicine | Admitting: Internal Medicine

## 2018-05-02 ENCOUNTER — Other Ambulatory Visit: Payer: Self-pay | Admitting: Internal Medicine

## 2018-05-02 DIAGNOSIS — E039 Hypothyroidism, unspecified: Secondary | ICD-10-CM | POA: Diagnosis not present

## 2018-05-02 DIAGNOSIS — R079 Chest pain, unspecified: Secondary | ICD-10-CM | POA: Diagnosis not present

## 2018-05-02 DIAGNOSIS — I259 Chronic ischemic heart disease, unspecified: Secondary | ICD-10-CM | POA: Diagnosis not present

## 2018-05-02 DIAGNOSIS — R0602 Shortness of breath: Secondary | ICD-10-CM | POA: Diagnosis not present

## 2018-05-02 DIAGNOSIS — R0789 Other chest pain: Secondary | ICD-10-CM | POA: Diagnosis not present

## 2018-05-02 DIAGNOSIS — J329 Chronic sinusitis, unspecified: Secondary | ICD-10-CM | POA: Diagnosis not present

## 2018-05-02 DIAGNOSIS — J9801 Acute bronchospasm: Secondary | ICD-10-CM | POA: Diagnosis not present

## 2018-05-02 MED ORDER — IOPAMIDOL (ISOVUE-370) INJECTION 76%
100.0000 mL | Freq: Once | INTRAVENOUS | Status: AC | PRN
  Administered 2018-05-02: 100 mL via INTRAVENOUS

## 2018-05-29 ENCOUNTER — Encounter: Payer: Self-pay | Admitting: Allergy & Immunology

## 2018-05-29 ENCOUNTER — Ambulatory Visit: Payer: 59 | Admitting: Allergy & Immunology

## 2018-05-29 VITALS — BP 116/78 | HR 77 | Resp 16 | Ht 59.0 in | Wt 190.0 lb

## 2018-05-29 DIAGNOSIS — J302 Other seasonal allergic rhinitis: Secondary | ICD-10-CM

## 2018-05-29 DIAGNOSIS — J3089 Other allergic rhinitis: Secondary | ICD-10-CM

## 2018-05-29 DIAGNOSIS — M8580 Other specified disorders of bone density and structure, unspecified site: Secondary | ICD-10-CM

## 2018-05-29 DIAGNOSIS — J4540 Moderate persistent asthma, uncomplicated: Secondary | ICD-10-CM

## 2018-05-29 MED ORDER — AZELASTINE HCL 0.1 % NA SOLN: 1.0000 | Freq: Two times a day (BID) | NASAL | 5 refills | Status: DC

## 2018-05-29 NOTE — Patient Instructions (Addendum)
1. Moderate persistent asthma, uncomplicated - Lung function looked stable today.  - I do not think that your Symbicort is contributing to your bone demineralization, but this is something you should talk to your doctor about. - You might need to start a medication to help with increasing bone density.   - I do not think that you need repeat chest CT, but your PCP might disagree.  - Daily controller medication(s): Symbicort 160/4.45mcg two puffs once daily - Prior to physical activity: ProAir 2 puffs 10-15 minutes before physical activity. - Rescue medications: ProAir 4 puffs every 4-6 hours as needed - Changes during respiratory infections or worsening symptoms: Increase Symbicort 160/4.5 to 2 puffs twice daily for ONE TO TWO WEEKS. - Asthma control goals:  * Full participation in all desired activities (may need albuterol before activity) * Albuterol use two time or less a week on average (not counting use with activity) * Cough interfering with sleep two time or less a month * Oral steroids no more than once a year * No hospitalizations  2. Perennial allergic rhinitis (grass, trees, mold, dust mite, and cat) - Continue with cetirizine 10mg  daily. - Increase Flonase to one spray per nostril twice daily. - Add on Astelin one spray per nostril twice daily.   3. Return in about 3 months (around 08/28/2018).   Please inform us of any Emergency Department visits, hospitalizations, or changes in symptoms. Call us before going to the ED for breathing or allergy symptoms since we might be able to fit you in for a sick visit. Feel free to contact us anytime with any questions, problems, or concerns.  It was a pleasure to see you again today!   Websites that have reliable patient information: 1. American Academy of Asthma, Allergy, and Immunology: www.aaaai.org 2. Food Allergy Research and Education (FARE): foodallergy.org 3. Mothers of Asthmatics: http://www.asthmacommunitynetwork.org 4.  American College of Allergy, Asthma, and Immunology: www.acaai.org

## 2018-05-29 NOTE — Progress Notes (Signed)
FOLLOW UP  Date of Service/Encounter:  05/29/18   Assessment:   Moderate persistent asthma, uncomplicated  Seasonal and perennial allergic rhinitis(grass, trees, mold, dust mite, and cat)  Hypothyroidism  Osteopenia     Asthma Reportables: Severity:moderate persistent Risk:low Control:well controlled  Plan/Recommendations:   1. Moderate persistent asthma, uncomplicated - Lung function looked stable today.  - I do not think that your Symbicort is contributing to your bone demineralization, but this is something you should talk to your doctor about. - You might need to start a medication to help with increasing bone density.   - I do not think that you need repeat chest CT, but your PCP might disagree.  - Daily controller medication(s): Symbicort 160/4.41mcg two puffs once daily - Prior to physical activity: ProAir 2 puffs 10-15 minutes before physical activity. - Rescue medications: ProAir 4 puffs every 4-6 hours as needed - Changes during respiratory infections or worsening symptoms: Increase Symbicort 160/4.5 to 2 puffs twice daily for ONE TO TWO WEEKS. - Asthma control goals:  * Full participation in all desired activities (may need albuterol before activity) * Albuterol use two time or less a week on average (not counting use with activity) * Cough interfering with sleep two time or less a month * Oral steroids no more than once a year * No hospitalizations  2. Perennial allergic rhinitis (grass, trees, mold, dust mite, and cat) - Continue with cetirizine 10mg  daily. - Increase Flonase to one spray per nostril twice daily. - Add on Astelin one spray per nostril twice daily.   3. Return in about 3 months (around 08/28/2018).   Subjective:   Valerie Henderson is a 53 y.o. female presenting today for follow up of  Chief Complaint  Patient presents with  . Asthma    Valerie Henderson has a history of the following: Patient Active Problem List   Diagnosis Date  Noted  . Vaginal dryness 02/01/2017  . Seasonal and perennial allergic rhinitis 12/26/2016  . Moderate persistent asthma without complication 56/81/2751  . Elevated BP 06/10/2015  . Post-operative state 10/22/2013  . Endometriosis of pelvis 09/16/2013  . Cervical os stenosis 08/06/2013  . Menorrhagia 08/06/2013  . Screening for colorectal cancer 01/14/2013  . Pre-op testing 12/25/2012  . Fibromyalgia 12/16/2012  . Irregular bleeding 12/05/2012  . Hypothyroid 12/05/2012  . Constipation 12/05/2012    History obtained from: chart review and patient.  [facility] Primary Care Provider is Sharilyn Sites, MD.     Valerie Henderson is a 53 y.o. female presenting for a follow up visit.  She was last seen in August 2019.  At that time, lung function looked great.  We changed her to Symbicort 160/4.5 mcg 2 puffs once daily.  For her rhinitis, we continued with cetirizine 10 mg daily and fluticasone 1 to 2 sprays per nostril daily as needed.  In the interim, around November, she was having a postnasal drip and constant throat clearing. She received a nasal spray around Thanksgiving. Around Christmas, she developed a chest pain on her ribs and this radiated to her shoulder. She went to see her PCP and was having chills. She did have a CXR that was diagnosed as costochondritis. She took ibuprofen and the cough worsened over the next week. This CXR also noted bone demineralization.   She did have a CT angiogram on January 2 that showed no pulmonary embolus, but there was right upper lobe airspace disease and bilateral lower lobe airspace disease concerning for  pneumonia. This was all ordered by Dr. Hilma Favors and Dr. Brenton Grills.  During this entire time, she did not receive any antibiotics until after the chest CT. She was treated with Augmentin BID for ten days, but she ended up taking a second course of ten days. She received 20 days of antibiotics in total. Her chest pain has improved, but she has an appointment with  Cardiology to evaluate this in a few weeks.   Since all of this occurred, she has done very well.  She remains on the Symbicort 1 puff twice daily.  She is concerned that this might have led to her bone demineralization.  I did reassure her on this today and told her that there was little systemic absorption.  In any case, her rescue inhaler use has been minimal.  Her ACT score today is 21, indicating excellent asthma control.  Her rhinitis symptoms continue to be a problem.  She does have throat clearing, although not to the same extent as that which proceeded her problems in November and December.  She is on her Flonase 1 spray per nostril daily.  She has never been on any other nasal sprays.  She remains on her cetirizine 10 mg daily.  Otherwise, there have been no changes to her past medical history, surgical history, family history, or social history.    Review of Systems: a 14-point review of systems is pertinent for what is mentioned in HPI.  Otherwise, all other systems were negative.  Constitutional: negative other than that listed in the HPI Eyes: negative other than that listed in the HPI Ears, nose, mouth, throat, and face: negative other than that listed in the HPI Respiratory: negative other than that listed in the HPI Cardiovascular: negative other than that listed in the HPI Gastrointestinal: negative other than that listed in the HPI Genitourinary: negative other than that listed in the HPI Integument: negative other than that listed in the HPI Hematologic: negative other than that listed in the HPI Musculoskeletal: negative other than that listed in the HPI Neurological: negative other than that listed in the HPI Allergy/Immunologic: negative other than that listed in the HPI    Objective:   Blood pressure 116/78, pulse 77, resp. rate 16, height 4\' 11"  (1.499 m), weight 190 lb (86.2 kg), last menstrual period 08/15/2013, SpO2 97 %. Body mass index is 38.38  kg/m.   Physical Exam:  General: Alert, interactive, in no acute distress.  Pleasant female. Eyes: No conjunctival injection bilaterally, no discharge on the right, no discharge on the left and no Horner-Trantas dots present. PERRL bilaterally. EOMI without pain. No photophobia.  Ears: Right TM pearly gray with normal light reflex, Left TM pearly gray with normal light reflex, Right TM intact without perforation and Left TM intact without perforation.  Nose/Throat: External nose within normal limits and septum midline. Turbinates edematous and pale with clear discharge. Posterior oropharynx erythematous without cobblestoning in the posterior oropharynx. Tonsils 2+ without exudates.  Tongue without thrush. Lungs: Clear to auscultation without wheezing, rhonchi or rales. No increased work of breathing. CV: Normal S1/S2. No murmurs. Capillary refill <2 seconds.  Skin: Warm and dry, without lesions or rashes. Neuro:   Grossly intact. No focal deficits appreciated. Responsive to questions.  Diagnostic studies:   Spirometry: results abnormal (FEV1: 1.46/71%, FVC: 1.99/73%, FEV1/FVC: 73%).    Spirometry consistent with possible restrictive disease.   Allergy Studies: none        Salvatore Marvel, MD  [facility]

## 2018-06-24 ENCOUNTER — Encounter: Payer: Self-pay | Admitting: Cardiovascular Disease

## 2018-06-24 ENCOUNTER — Ambulatory Visit: Payer: 59 | Admitting: Cardiovascular Disease

## 2018-06-24 VITALS — BP 134/74 | HR 80 | Ht 59.75 in | Wt 190.0 lb

## 2018-06-24 DIAGNOSIS — I10 Essential (primary) hypertension: Secondary | ICD-10-CM | POA: Diagnosis not present

## 2018-06-24 DIAGNOSIS — R079 Chest pain, unspecified: Secondary | ICD-10-CM

## 2018-06-24 NOTE — Progress Notes (Signed)
CARDIOLOGY CONSULT NOTE  Patient ID: Valerie Henderson MRN: [id number] DOB/AGE: 52-Feb-1967 52 y.o.  Admit date: (Not on file) Primary Physician: Sharilyn Sites, MD Referring Physician: Sharilyn Sites, MD  Reason for Consultation: Chest pain  HPI: Valerie Henderson is a 53 y.o. female who is being seen today for the evaluation of chest pain at the request of Sharilyn Sites, MD.   She was evaluated in the ED for chest pain on 04/22/2018. I have personally reviewed all documentation, labs, radiographic and cardiovascular studies, and independently interpreted all ECG's.  BP was markedly elevated, 176/94.  Troponins and d-dimer were normal.  Chest x-ray showed no acute findings other than enlargement of cardiac silhouette with minimal atelectasis or scarring at lingula.  O2 saturations were normal.  CBC and basic metabolic panel were also normal.  CT angiography of the chest per pulmonary protocol showed no evidence of pulmonary embolus.  There was no mention of coronary artery calcification.  I personally reviewed the ECG which showed sinus tachycardia, 106 bpm, and nonspecific ST segment abnormalities.  On the day she presented to the ED, she had been experiencing some pains in the left infra axillary region radiating to her back into her left shoulder.  Symptoms were exacerbated with deep inspiration and lying down on her left side.  She denies exertional chest pain.  She has chronic exertional dyspnea which she attributes to asthma and obesity.  She has occasional palpitations and occasional lightheadedness.  She denies orthopnea paroxysmal nocturnal dyspnea.  She has tried increasing her activity levels and exercises on an elliptical and does floor exercises.  She has some posterior neck pain and her legs are occasionally tight.  She denies leg swelling.  Family history: Her mother is also my patient.  She has a history of coronary artery disease.  Her father had an MI and several of his  brothers had MIs in their late 51s.   SocHx: Her mother, Harlan Stains, is also my patient.  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  Allergies  Allergen Reactions  . Neosporin [Neomycin-Bacitracin Zn-Polymyx] Rash    Current Outpatient Medications  Medication Sig Dispense Refill  . amLODipine (NORVASC) 5 MG tablet Take 5 mg by mouth daily.  8  . azelastine (ASTELIN) 0.1 % nasal spray Place 1 spray into both nostrils 2 (two) times daily for 30 days. Use in each nostril as directed 30 mL 5  . cholecalciferol (VITAMIN D) 1000 units tablet Take 1,000 Units by mouth daily.    Marland Kitchen FOLIC ACID PO Take by mouth daily.    Marland Kitchen levothyroxine (SYNTHROID, LEVOTHROID) 150 MCG tablet Take 137 mcg by mouth daily.   1  . MAGNESIUM PO Take by mouth daily.    . Multiple Vitamin (MULTIVITAMIN) tablet Take 1 tablet by mouth daily.    . SYMBICORT 160-4.5 MCG/ACT inhaler INHALE 2 PUFFS INTO THE LUNGS TWICE DAILY 10.2 g 0  . UNABLE TO FIND Vit C gummies-takes 2 daily    . VENTOLIN HFA 108 (90 Base) MCG/ACT inhaler 2 puffs every 6 (six) hours as needed.   1  . zolpidem (AMBIEN) 5 MG tablet take 1 tablet by mouth at bedtime if needed  0   No current facility-administered medications for this visit.     Past Medical History:  Diagnosis Date  . Anemia   . Asthma   . Complication of anesthesia   . Constipation 12/05/2012  . Elevated BP 06/10/2015  . Endometriosis   .  Fibromyalgia 12/16/2012  . GERD (gastroesophageal reflux disease)   . Hypothyroid 12/05/2012  . Hypothyroidism   . Insomnia   . Irregular bleeding 12/05/2012   Endometrial  ablation 9/14  . PONV (postoperative nausea and vomiting)     Past Surgical History:  Procedure Laterality Date  . CHOLECYSTECTOMY    . DILITATION & CURRETTAGE/HYSTROSCOPY WITH THERMACHOICE ABLATION N/A 01/14/2013   Procedure: DILATATION & CURETTAGE/HYSTEROSCOPY WITH THERMACHOICE ABLATION;  Surgeon: Jonnie Kind, MD;  Location: [facility];  Service: Gynecology;  Laterality:  N/A;  8 ml D5W total in, 8 ml D5W total out; 86-88 degrees Celcius; 8 minutes and 39 seconds total time  . LAPAROSCOPIC BILATERAL SALPINGECTOMY Bilateral 01/14/2013   Procedure: LAPAROSCOPIC BILATERAL SALPINGECTOMY;  Surgeon: Jonnie Kind, MD;  Location: [facility];  Service: Gynecology;  Laterality: Bilateral;  . OOPHORECTOMY Bilateral 09/16/2013   Procedure: OOPHORECTOMY;  Surgeon: Jonnie Kind, MD;  Location: [facility];  Service: Gynecology;  Laterality: Bilateral;  . SUPRACERVICAL ABDOMINAL HYSTERECTOMY N/A 09/16/2013   Procedure: HYSTERECTOMY SUPRACERVICAL ABDOMINAL;  Surgeon: Jonnie Kind, MD;  Location: [facility];  Service: Gynecology;  Laterality: N/A;    Social History   Socioeconomic History  . Marital status: Married    Spouse name: Not on file  . Number of children: Not on file  . Years of education: Not on file  . Highest education level: Not on file  Occupational History  . Occupation: self-employed    Fish farm manager: SELF EMPLOYED    Comment: [employer] by [employer]  Social Needs  . Financial resource strain: Not on file  . Food insecurity:    Worry: Not on file    Inability: Not on file  . Transportation needs:    Medical: Not on file    Non-medical: Not on file  Tobacco Use  . Smoking status: Former Smoker    Years: 2.50    Types: Cigarettes  . Smokeless tobacco: Never Used  . Tobacco comment: only used for a few years during her teens   Substance and Sexual Activity  . Alcohol use: Yes    Comment: occassional  . Drug use: No  . Sexual activity: Yes    Birth control/protection: Surgical    Comment: supracervical hyst  Lifestyle  . Physical activity:    Days per week: Not on file    Minutes per session: Not on file  . Stress: Not on file  Relationships  . Social connections:    Talks on phone: Not on file    Gets together: Not on file    Attends religious service: Not on file    Active member of club or organization: Not on file    Attends meetings of clubs or  organizations: Not on file    Relationship status: Not on file  . Intimate partner violence:    Fear of current or ex partner: Not on file    Emotionally abused: Not on file    Physically abused: Not on file    Forced sexual activity: Not on file  Other Topics Concern  . Not on file  Social History Narrative  . Not on file      Current Meds  Medication Sig  . amLODipine (NORVASC) 5 MG tablet Take 5 mg by mouth daily.  Marland Kitchen azelastine (ASTELIN) 0.1 % nasal spray Place 1 spray into both nostrils 2 (two) times daily for 30 days. Use in each nostril as directed  . cholecalciferol (VITAMIN D) 1000 units tablet Take 1,000 Units  by mouth daily.  Marland Kitchen FOLIC ACID PO Take by mouth daily.  Marland Kitchen levothyroxine (SYNTHROID, LEVOTHROID) 150 MCG tablet Take 137 mcg by mouth daily.   Marland Kitchen MAGNESIUM PO Take by mouth daily.  . Multiple Vitamin (MULTIVITAMIN) tablet Take 1 tablet by mouth daily.  . SYMBICORT 160-4.5 MCG/ACT inhaler INHALE 2 PUFFS INTO THE LUNGS TWICE DAILY  . UNABLE TO FIND Vit C gummies-takes 2 daily  . VENTOLIN HFA 108 (90 Base) MCG/ACT inhaler 2 puffs every 6 (six) hours as needed.   . zolpidem (AMBIEN) 5 MG tablet take 1 tablet by mouth at bedtime if needed      Review of systems complete and found to be negative unless listed above in HPI    Physical exam Blood pressure 134/74, pulse 80, height 4' 11.75" (1.518 m), weight 190 lb (86.2 kg), last menstrual period 08/15/2013, SpO2 97 %. General: NAD Neck: No JVD, no thyromegaly or thyroid nodule.  Lungs: Clear to auscultation bilaterally with normal respiratory effort. CV: Nondisplaced PMI. Regular rate and rhythm, normal S1/S2, no S3/S4, no murmur.  No peripheral edema.  No carotid bruit.    Abdomen: Soft, nontender, no distention.  Skin: Intact without lesions or rashes.  Neurologic: Alert and oriented x 3.  Psych: Normal affect. Extremities: No clubbing or cyanosis.  HEENT: Normal.   ECG: Most recent ECG reviewed.   Labs: Lab  Results  Component Value Date/Time   K 4.1 04/22/2018 01:55 PM   BUN 13 04/22/2018 01:55 PM   BUN 11 06/14/2015 08:24 AM   CREATININE 0.57 04/22/2018 01:55 PM   CREATININE 0.71 12/05/2012 02:28 PM   ALT 14 06/14/2015 08:24 AM   TSH 2.440 06/14/2015 08:24 AM   HGB 12.9 04/22/2018 01:55 PM   HGB 12.2 06/14/2015 08:24 AM     Lipids: Lab Results  Component Value Date/Time   LDLCALC 111 (H) 06/14/2015 08:24 AM   CHOL 188 06/14/2015 08:24 AM   TRIG 68 06/14/2015 08:24 AM   HDL 63 06/14/2015 08:24 AM        ASSESSMENT AND PLAN:  1.  Chest pain: Symptoms at the time she presented to the ED appeared atypical for heart disease.  It appeared to be more musculoskeletal in etiology in that they were exacerbated with lying down on her left side with pain in her left shoulder with tenderness to palpation.  She does have a strong family history of heart disease.  Her dyspnea is likely related to asthma and obesity.  For re-stratification purposes, I will obtain a stress echocardiogram.  2.  Hypertension: Blood pressure is normal.  No changes to therapy.   Disposition: Follow up in 3-4 months  Signed: Kate Sable, M.D., F.A.C.C.  06/24/2018, 8:43 AM

## 2018-06-24 NOTE — Patient Instructions (Signed)
Medication Instructions:  Your physician recommends that you continue on your current medications as directed. Please refer to the Current Medication list given to you today.  If you need a refill on your cardiac medications before your next appointment, please call your pharmacy.   Lab work: None today If you have labs (blood work) drawn today and your tests are completely normal, you will receive your results only by: Marland Kitchen MyChart Message (if you have MyChart) OR . A paper copy in the mail If you have any lab test that is abnormal or we need to change your treatment, we will call you to review the results.  Testing/Procedures: Your physician has requested that you have a stress echocardiogram. For further information please visit [email or web address]. Please follow instruction sheet as given.   Follow-Up: At [facility], you and your health needs are our priority.  As part of our continuing mission to provide you with exceptional heart care, we have created designated Provider Care Teams.  These Care Teams include your primary Cardiologist (physician) and Advanced Practice Providers (APPs -  Physician Assistants and Nurse Practitioners) who all work together to provide you with the care you need, when you need it. You will need a follow up appointment in 4 months.  Please call our office 2 months in advance to schedule this appointment.  You may see Kate Sable, MD or one of the following Advanced Practice Providers on your designated Care Team:   Bernerd Pho, PA-C [facility]) . Ermalinda Barrios, PA-C ([place])  Any Other Special Instructions Will Be Listed Below (If Applicable). None

## 2018-06-25 ENCOUNTER — Ambulatory Visit: Payer: 59 | Admitting: Allergy & Immunology

## 2018-06-26 ENCOUNTER — Ambulatory Visit: Payer: 59 | Admitting: Allergy & Immunology

## 2018-07-03 ENCOUNTER — Other Ambulatory Visit: Payer: Self-pay

## 2018-07-03 ENCOUNTER — Ambulatory Visit (HOSPITAL_COMMUNITY)
Admission: RE | Admit: 2018-07-03 | Discharge: 2018-07-03 | Disposition: A | Discharge status: Home / Self Care | Payer: 59 | Source: Ambulatory Visit | Attending: Cardiovascular Disease | Admitting: Cardiovascular Disease

## 2018-07-03 DIAGNOSIS — R079 Chest pain, unspecified: Secondary | ICD-10-CM | POA: Diagnosis not present

## 2018-07-03 NOTE — Progress Notes (Signed)
*  PRELIMINARY RESULTS* Echocardiogram Echocardiogram Stress Test has been performed.  Valerie Henderson 07/03/2018, 10:08 AM

## 2018-07-05 LAB — ECHOCARDIOGRAM STRESS TEST
CHL RATE OF PERCEIVED EXERTION: 13
CSEPED: 4 min
CSEPEDS: 19 s
CSEPEW: 7 METS
CSEPHR: 97 %
MPHR: 169 {beats}/min
Peak HR: 164 {beats}/min
Rest HR: 70 {beats}/min

## 2018-07-12 DIAGNOSIS — M25561 Pain in right knee: Secondary | ICD-10-CM | POA: Diagnosis not present

## 2018-07-12 DIAGNOSIS — M25562 Pain in left knee: Secondary | ICD-10-CM | POA: Diagnosis not present

## 2018-08-28 ENCOUNTER — Ambulatory Visit: Payer: 59 | Admitting: Allergy & Immunology

## 2018-09-18 ENCOUNTER — Ambulatory Visit: Payer: 59 | Admitting: Allergy & Immunology

## 2018-10-21 ENCOUNTER — Encounter: Payer: Self-pay | Admitting: Cardiovascular Disease

## 2018-10-21 ENCOUNTER — Other Ambulatory Visit: Payer: Self-pay

## 2018-10-21 ENCOUNTER — Ambulatory Visit (INDEPENDENT_AMBULATORY_CARE_PROVIDER_SITE_OTHER): Payer: 59 | Admitting: Cardiovascular Disease

## 2018-10-21 VITALS — BP 115/75 | HR 75 | Temp 98.3°F | Ht 60.0 in | Wt 196.0 lb

## 2018-10-21 DIAGNOSIS — I10 Essential (primary) hypertension: Secondary | ICD-10-CM | POA: Diagnosis not present

## 2018-10-21 DIAGNOSIS — R079 Chest pain, unspecified: Secondary | ICD-10-CM

## 2018-10-21 NOTE — Patient Instructions (Signed)
Medication Instructions: Your physician recommends that you continue on your current medications as directed. Please refer to the Current Medication list given to you today.   Labwork: none  Procedures/Testing: none  Follow-Up: As needed Any Additional Special Instructions Will Be Listed Below (If Applicable).     If you need a refill on your cardiac medications before your next appointment, please call your pharmacy.      Thank you for choosing [facility] !

## 2018-10-21 NOTE — Progress Notes (Signed)
SUBJECTIVE: The patient returns for follow-up after undergoing cardiovascular testing performed for the evaluation of chest pain.  She underwent a normal stress echocardiogram on 07/03/2018.  Resting LVEF was low normal, 50 to 55%.  She denies exertional chest pain.  She has chronic exertional dyspnea which she attributes to asthma and obesity.  She has been staying active mulching and laying down to brick patio.  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   Family history: Her mother is also my patient.  She has a history of coronary artery disease.  Her father had an MI and several of his brothers had MIs in their late 7s.  SocHx: Her mother, Harlan Stains, is also my patient.  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  Review of Systems: As per "subjective", otherwise negative.  Allergies  Allergen Reactions  . Neosporin [Neomycin-Bacitracin Zn-Polymyx] Rash    Current Outpatient Medications  Medication Sig Dispense Refill  . amLODipine (NORVASC) 5 MG tablet Take 5 mg by mouth daily.  8  . cholecalciferol (VITAMIN D) 1000 units tablet Take 1,000 Units by mouth daily.    Marland Kitchen FOLIC ACID PO Take by mouth daily.    Marland Kitchen levothyroxine (SYNTHROID, LEVOTHROID) 150 MCG tablet Take 137 mcg by mouth daily.   1  . MAGNESIUM PO Take by mouth daily.    . Multiple Vitamin (MULTIVITAMIN) tablet Take 1 tablet by mouth daily.    . SYMBICORT 160-4.5 MCG/ACT inhaler INHALE 2 PUFFS INTO THE LUNGS TWICE DAILY 10.2 g 0  . UNABLE TO FIND Vit C gummies-takes 2 daily    . VENTOLIN HFA 108 (90 Base) MCG/ACT inhaler 2 puffs every 6 (six) hours as needed.   1  . zolpidem (AMBIEN) 5 MG tablet take 1 tablet by mouth at bedtime if needed  0   No current facility-administered medications for this visit.     Past Medical History:  Diagnosis Date  . Anemia   . Asthma   . Complication of anesthesia   . Constipation 12/05/2012  . Elevated BP 06/10/2015  . Endometriosis   .  Fibromyalgia 12/16/2012  . GERD (gastroesophageal reflux disease)   . Hypothyroid 12/05/2012  . Hypothyroidism   . Insomnia   . Irregular bleeding 12/05/2012   Endometrial  ablation 9/14  . PONV (postoperative nausea and vomiting)     Past Surgical History:  Procedure Laterality Date  . CHOLECYSTECTOMY    . DILITATION & CURRETTAGE/HYSTROSCOPY WITH THERMACHOICE ABLATION N/A 01/14/2013   Procedure: DILATATION & CURETTAGE/HYSTEROSCOPY WITH THERMACHOICE ABLATION;  Surgeon: Jonnie Kind, MD;  Location: [facility];  Service: Gynecology;  Laterality: N/A;  8 ml D5W total in, 8 ml D5W total out; 86-88 degrees Celcius; 8 minutes and 39 seconds total time  . LAPAROSCOPIC BILATERAL SALPINGECTOMY Bilateral 01/14/2013   Procedure: LAPAROSCOPIC BILATERAL SALPINGECTOMY;  Surgeon: Jonnie Kind, MD;  Location: [facility];  Service: Gynecology;  Laterality: Bilateral;  . OOPHORECTOMY Bilateral 09/16/2013   Procedure: OOPHORECTOMY;  Surgeon: Jonnie Kind, MD;  Location: [facility];  Service: Gynecology;  Laterality: Bilateral;  . SUPRACERVICAL ABDOMINAL HYSTERECTOMY N/A 09/16/2013   Procedure: HYSTERECTOMY SUPRACERVICAL ABDOMINAL;  Surgeon: Jonnie Kind, MD;  Location: [facility];  Service: Gynecology;  Laterality: N/A;    Social History   Socioeconomic History  . Marital status: Married    Spouse name: Not on file  . Number of children: Not on file  . Years of education: Not on file  .  Highest education level: Not on file  Occupational History  . Occupation: self-employed    Fish farm manager: SELF EMPLOYED    Comment: Hair by [employer]  Social Needs  . Financial resource strain: Not on file  . Food insecurity    Worry: Not on file    Inability: Not on file  . Transportation needs    Medical: Not on file    Non-medical: Not on file  Tobacco Use  . Smoking status: Former Smoker    Years: 2.50    Types: Cigarettes  . Smokeless tobacco: Never Used  . Tobacco comment: only used for a few years during her teens    Substance and Sexual Activity  . Alcohol use: Yes    Comment: occassional  . Drug use: No  . Sexual activity: Yes    Birth control/protection: Surgical    Comment: supracervical hyst  Lifestyle  . Physical activity    Days per week: Not on file    Minutes per session: Not on file  . Stress: Not on file  Relationships  . Social Herbalist on phone: Not on file    Gets together: Not on file    Attends religious service: Not on file    Active member of club or organization: Not on file    Attends meetings of clubs or organizations: Not on file    Relationship status: Not on file  . Intimate partner violence    Fear of current or ex partner: Not on file    Emotionally abused: Not on file    Physically abused: Not on file    Forced sexual activity: Not on file  Other Topics Concern  . Not on file  Social History Narrative  . Not on file     Vitals:   10/21/18 1320  BP: 115/75  Pulse: 75  Temp: 98.3 F (36.8 C)  TempSrc: Temporal  SpO2: 97%  Weight: 196 lb (88.9 kg)  Height: 5' (1.524 m)    Wt Readings from Last 3 Encounters:  10/21/18 196 lb (88.9 kg)  06/24/18 190 lb (86.2 kg)  05/29/18 190 lb (86.2 kg)     PHYSICAL EXAM General: NAD HEENT: Normal. Neck: No JVD, no thyromegaly. Lungs: Clear to auscultation bilaterally with normal respiratory effort. CV: Regular rate and rhythm, normal S1/S2, no S3/S4, no murmur. No pretibial or periankle edema.     Abdomen: Soft, nontender, no distention.  Neurologic: Alert and oriented.  Psych: Normal affect. Skin: Normal. Musculoskeletal: No gross deformities.    ECG: Reviewed above under Subjective   Labs: Lab Results  Component Value Date/Time   K 4.1 04/22/2018 01:55 PM   BUN 13 04/22/2018 01:55 PM   BUN 11 06/14/2015 08:24 AM   CREATININE 0.57 04/22/2018 01:55 PM   CREATININE 0.71 12/05/2012 02:28 PM   ALT 14 06/14/2015 08:24 AM   TSH 2.440 06/14/2015 08:24 AM   HGB 12.9 04/22/2018 01:55 PM    HGB 12.2 06/14/2015 08:24 AM     Lipids: Lab Results  Component Value Date/Time   LDLCALC 111 (H) 06/14/2015 08:24 AM   CHOL 188 06/14/2015 08:24 AM   TRIG 68 06/14/2015 08:24 AM   HDL 63 06/14/2015 08:24 AM       ASSESSMENT AND PLAN: 1.  Chest pain: Normal stress echocardiogram in March 2020.  No symptom recurrence.  No further cardiac testing is indicated at this time.  2.  Hypertension: Blood pressure is normal.  No changes to therapy.  Disposition: Follow up as needed   Kate Sable, M.D., F.A.C.C.

## 2018-10-27 ENCOUNTER — Other Ambulatory Visit: Payer: Self-pay

## 2018-10-27 ENCOUNTER — Encounter (HOSPITAL_COMMUNITY): Payer: Self-pay

## 2018-10-27 ENCOUNTER — Emergency Department (HOSPITAL_COMMUNITY)
Admission: EM | Admit: 2018-10-27 | Discharge: 2018-10-27 | Disposition: A | Discharge status: Home / Self Care | Payer: 59 | Attending: Emergency Medicine | Admitting: Emergency Medicine

## 2018-10-27 DIAGNOSIS — Z79899 Other long term (current) drug therapy: Secondary | ICD-10-CM | POA: Insufficient documentation

## 2018-10-27 DIAGNOSIS — J45909 Unspecified asthma, uncomplicated: Secondary | ICD-10-CM | POA: Insufficient documentation

## 2018-10-27 DIAGNOSIS — E039 Hypothyroidism, unspecified: Secondary | ICD-10-CM | POA: Insufficient documentation

## 2018-10-27 DIAGNOSIS — M62838 Other muscle spasm: Secondary | ICD-10-CM | POA: Insufficient documentation

## 2018-10-27 DIAGNOSIS — Z87891 Personal history of nicotine dependence: Secondary | ICD-10-CM | POA: Insufficient documentation

## 2018-10-27 DIAGNOSIS — M542 Cervicalgia: Secondary | ICD-10-CM | POA: Diagnosis present

## 2018-10-27 MED ORDER — KETOROLAC TROMETHAMINE 30 MG/ML IJ SOLN
30.0000 mg | Freq: Once | INTRAMUSCULAR | Status: AC
  Administered 2018-10-27: 30 mg via INTRAMUSCULAR
  Filled 2018-10-27: qty 1

## 2018-10-27 MED ORDER — ACETAMINOPHEN 500 MG PO TABS: 1000.0000 mg | ORAL_TABLET | Freq: Three times a day (TID) | ORAL | 0 refills | Status: DC | PRN

## 2018-10-27 MED ORDER — DIAZEPAM 5 MG PO TABS
5.0000 mg | ORAL_TABLET | Freq: Once | ORAL | Status: AC
  Administered 2018-10-27: 5 mg via ORAL
  Filled 2018-10-27: qty 1

## 2018-10-27 MED ORDER — DIAZEPAM 5 MG PO TABS: 5.0000 mg | ORAL_TABLET | Freq: Three times a day (TID) | ORAL | 0 refills | Status: DC | PRN

## 2018-10-27 MED ORDER — OXYCODONE-ACETAMINOPHEN 5-325 MG PO TABS
2.0000 | ORAL_TABLET | Freq: Once | ORAL | Status: AC
  Administered 2018-10-27: 2 via ORAL
  Filled 2018-10-27: qty 2

## 2018-10-27 MED ORDER — IBUPROFEN 600 MG PO TABS: 600.0000 mg | ORAL_TABLET | Freq: Four times a day (QID) | ORAL | 0 refills | Status: DC | PRN

## 2018-10-27 NOTE — Discharge Instructions (Addendum)
Take Valium every 8 hours as needed for muscle spasms.  Do not drive or operate machinery while taking this medication.  Take ibuprofen every 6 hours as needed for pain.  You can alternate with Tylenol as prescribed as well.  Use heat and ice alternating 20 minutes on, 20 minutes off.  Attempt the exercises and stretches we discussed after heating and ice following.  Please follow-up with your doctor in the next few days if your symptoms are not improving.  Please return to the emergency department if you develop any new or worsening symptoms including complete numbness of your hand or arm, chest pain, shortness of breath, or any other new or concerning symptoms.

## 2018-10-27 NOTE — ED Notes (Signed)
Patient is resting comfortably. 

## 2018-10-27 NOTE — ED Notes (Signed)
ED Provider at bedside. 

## 2018-10-27 NOTE — ED Notes (Signed)
Pt reports increasing stiff neck since yesterday   Took muscle relaxant yesterday but not today  Here for evaluation of neck pain

## 2018-10-27 NOTE — ED Triage Notes (Signed)
Pt presents to ED with complaints of left sided neck pain which started yesterday, decreased ROM to neck. Pt denies HA, fever.

## 2018-10-27 NOTE — ED Provider Notes (Signed)
[facility] EMERGENCY DEPARTMENT Provider Note   CSN: [id number] Arrival date & time: 10/27/18  1720    History   Chief Complaint Chief Complaint  Patient presents with  . Neck Pain    HPI Valerie Henderson is a 53 y.o. female with history of hypothyroidism, GERD, myalgia, asthma who presents with a 2-day history of neck tightness and pain.  Patient reports it began as mild tightness and has progressively worsened despite taking old Flexeril, ibuprofen, and using ice and heat at home.  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  She denies any fever, chest pain, shortness of breath, headache, abdominal pain, nausea, vomiting.  She denies any numbness or tingling in her hands or arms.     HPI  Past Medical History:  Diagnosis Date  . Anemia   . Asthma   . Complication of anesthesia   . Constipation 12/05/2012  . Elevated BP 06/10/2015  . Endometriosis   . Fibromyalgia 12/16/2012  . GERD (gastroesophageal reflux disease)   . Hypothyroid 12/05/2012  . Hypothyroidism   . Insomnia   . Irregular bleeding 12/05/2012   Endometrial  ablation 9/14  . PONV (postoperative nausea and vomiting)     Patient Active Problem List   Diagnosis Date Noted  . Vaginal dryness 02/01/2017  . Seasonal and perennial allergic rhinitis 12/26/2016  . Moderate persistent asthma without complication 05/09/3233  . Elevated BP 06/10/2015  . Post-operative state 10/22/2013  . Endometriosis of pelvis 09/16/2013  . Cervical os stenosis 08/06/2013  . Menorrhagia 08/06/2013  . Screening for colorectal cancer 01/14/2013  . Pre-op testing 12/25/2012  . Fibromyalgia 12/16/2012  . Irregular bleeding 12/05/2012  . Hypothyroid 12/05/2012  . Constipation 12/05/2012    Past Surgical History:  Procedure Laterality Date  . CHOLECYSTECTOMY    . DILITATION & CURRETTAGE/HYSTROSCOPY WITH THERMACHOICE ABLATION N/A 01/14/2013   Procedure: DILATATION & CURETTAGE/HYSTEROSCOPY WITH THERMACHOICE ABLATION;  Surgeon:  Jonnie Kind, MD;  Location: [facility];  Service: Gynecology;  Laterality: N/A;  8 ml D5W total in, 8 ml D5W total out; 86-88 degrees Celcius; 8 minutes and 39 seconds total time  . LAPAROSCOPIC BILATERAL SALPINGECTOMY Bilateral 01/14/2013   Procedure: LAPAROSCOPIC BILATERAL SALPINGECTOMY;  Surgeon: Jonnie Kind, MD;  Location: [facility];  Service: Gynecology;  Laterality: Bilateral;  . OOPHORECTOMY Bilateral 09/16/2013   Procedure: OOPHORECTOMY;  Surgeon: Jonnie Kind, MD;  Location: [facility];  Service: Gynecology;  Laterality: Bilateral;  . SUPRACERVICAL ABDOMINAL HYSTERECTOMY N/A 09/16/2013   Procedure: HYSTERECTOMY SUPRACERVICAL ABDOMINAL;  Surgeon: Jonnie Kind, MD;  Location: [facility];  Service: Gynecology;  Laterality: N/A;     OB History    Gravida  1   Para  1   Term      Preterm      AB      Living  1     SAB      TAB      Ectopic      Multiple      Live Births  1            Home Medications    Prior to Admission medications   Medication Sig Start Date End Date Taking? Authorizing Provider  amLODipine (NORVASC) 5 MG tablet Take 5 mg by mouth daily. 06/19/17  Yes [provider]  cholecalciferol (VITAMIN D) 1000 units tablet Take 1,000 Units by mouth daily.   Yes [provider]  FOLIC ACID PO Take by mouth daily.  Yes [provider]  levothyroxine (SYNTHROID, LEVOTHROID) 150 MCG tablet Take 137 mcg by mouth daily.  12/15/17  Yes [provider]  MAGNESIUM PO Take by mouth daily.   Yes [provider]  Multiple Vitamin (MULTIVITAMIN) tablet Take 1 tablet by mouth daily.   Yes [provider]  SYMBICORT 160-4.5 MCG/ACT inhaler INHALE 2 PUFFS INTO THE LUNGS TWICE DAILY 11/12/17  Yes Valentina Shaggy, MD  UNABLE TO FIND Vit C gummies-takes 2 daily   Yes [provider]  VENTOLIN HFA 108 (90 Base) MCG/ACT inhaler 2 puffs every 6 (six) hours as needed.  05/15/17  Yes [provider]   zolpidem (AMBIEN) 5 MG tablet take 1 tablet by mouth at bedtime if needed 05/24/17  Yes [provider]  acetaminophen (TYLENOL) 500 MG tablet Take 2 tablets (1,000 mg total) by mouth every 8 (eight) hours as needed for moderate pain. 10/27/18   Kaiyon Hynes, Bea Graff, PA-C  diazepam (VALIUM) 5 MG tablet Take 1 tablet (5 mg total) by mouth every 8 (eight) hours as needed for muscle spasms. 10/27/18   Dustee Bottenfield, Bea Graff, PA-C  ibuprofen (ADVIL) 600 MG tablet Take 1 tablet (600 mg total) by mouth every 6 (six) hours as needed. 10/27/18   Frederica Kuster, PA-C    Family History Family History  Problem Relation Age of Onset  . Heart disease Mother        heart attack  . Hypertension Mother   . Heart disease Father        heart attack  . Hypertension Father   . Cancer Maternal Aunt        breast  . Cancer Maternal Grandmother        uterine  . Cancer Maternal Grandfather        lung  . Cancer Paternal Grandmother        uterine  . Hypertension Brother   . Hypertension Sister   . Hypertension Other   . Colon cancer Neg Hx   . Allergic rhinitis Neg Hx   . Angioedema Neg Hx   . Asthma Neg Hx   . Atopy Neg Hx   . Eczema Neg Hx   . Immunodeficiency Neg Hx   . Urticaria Neg Hx     Social History Social History   Tobacco Use  . Smoking status: Former Smoker    Years: 2.50    Types: Cigarettes  . Smokeless tobacco: Never Used  . Tobacco comment: only used for a few years during her teens   Substance Use Topics  . Alcohol use: Yes    Comment: occassional  . Drug use: No     Allergies   Neosporin [neomycin-bacitracin zn-polymyx]   Review of Systems Review of Systems  Constitutional: Negative for chills and fever.  HENT: Negative for facial swelling and sore throat.   Respiratory: Negative for shortness of breath.   Cardiovascular: Negative for chest pain.  Gastrointestinal: Negative for abdominal pain, nausea and vomiting.  Genitourinary: Negative for dysuria.   Musculoskeletal: Positive for neck pain and neck stiffness. Negative for back pain.  Skin: Negative for rash and wound.  Neurological: Negative for headaches.  Psychiatric/Behavioral: The patient is not nervous/anxious.      Physical Exam Updated Vital Signs BP (!) 141/110 (BP Location: Right Arm)   Pulse (!) 105   Temp 98.9 F (37.2 C) (Oral)   Resp 18   Ht 5' (1.524 m)   Wt 88 kg   LMP 08/15/2013  Comment: [place]  SpO2 97%   BMI 37.89 kg/m   Physical Exam Vitals signs and nursing note reviewed.  Constitutional:      General: She is not in acute distress.    Appearance: She is well-developed. She is not diaphoretic.  HENT:     Head: Normocephalic and atraumatic.     Mouth/Throat:     Pharynx: No oropharyngeal exudate.  Eyes:     General: No scleral icterus.       Right eye: No discharge.        Left eye: No discharge.     Conjunctiva/sclera: Conjunctivae normal.     Pupils: Pupils are equal, round, and reactive to light.  Neck:     Musculoskeletal: Neck supple. Decreased range of motion. Pain with movement, torticollis and muscular tenderness present. No spinous process tenderness.     Thyroid: No thyromegaly.   Cardiovascular:     Rate and Rhythm: Regular rhythm.     Heart sounds: Normal heart sounds. No murmur. No friction rub. No gallop.   Pulmonary:     Effort: Pulmonary effort is normal. No respiratory distress.     Breath sounds: Normal breath sounds. No stridor. No wheezing or rales.  Abdominal:     General: Bowel sounds are normal. There is no distension.     Palpations: Abdomen is soft.     Tenderness: There is no abdominal tenderness. There is no guarding or rebound.  Lymphadenopathy:     Cervical: No cervical adenopathy.  Skin:    General: Skin is warm and dry.     Coloration: Skin is not pale.     Findings: No rash.  Neurological:     Mental Status: She is alert.     Coordination: Coordination normal.     Comments: Equal bilateral grip strength,  5/5 strength to bilateral upper extremities; normal sensation throughout      ED Treatments / Results  Labs (all labs ordered are listed, but only abnormal results are displayed) Labs Reviewed - No data to display  EKG None  Radiology No results found.  Procedures Procedures (including critical care time)  Medications Ordered in ED Medications  ketorolac (TORADOL) 30 MG/ML injection 30 mg (30 mg Intramuscular Given 10/27/18 1747)  diazepam (VALIUM) tablet 5 mg (5 mg Oral Given 10/27/18 1747)  oxyCODONE-acetaminophen (PERCOCET/ROXICET) 5-325 MG per tablet 2 tablet (2 tablets Oral Given 10/27/18 1857)     Initial Impression / Assessment and Plan / ED Course  I have reviewed the triage vital signs and the nursing notes.  Pertinent labs & imaging results that were available during my care of the patient were reviewed by me and considered in my medical decision making (see chart for details).  Clinical Course as of Oct 26 1936  Sun Oct 27, 2018  1841 On re-eval after Toradol and Valium is still having significant pain although her ROM has mild improved. Will order Percocet and reassess.   [AL]    Clinical Course User Index [AL] Frederica Kuster, PA-C       Patient presenting with cervical muscle spasms.  She has no midline tenderness and no trauma.  Patient is neurovascularly intact.  No indication for imaging at this time.  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  Suspect persistence arm elevation as cause.  Patient feeling much improved after IM Toradol, Valium, and Percocet in the ED.  Will discharge home with short course of Valium, ibuprofen, Tylenol.  Heat and  stretching discussed.  Follow-up to PCP as needed.  Advised to rest the next few days and not work.  Return precautions discussed.  Patient understands and agrees with plan.  Patient vital stable through ED course and discharged in satisfactory condition.  Final Clinical Impressions(s) / ED Diagnoses    Final diagnoses:  Muscle spasms of neck    ED Discharge Orders         Ordered    diazepam (VALIUM) 5 MG tablet  Every 8 hours PRN     10/27/18 1934    ibuprofen (ADVIL) 600 MG tablet  Every 6 hours PRN     10/27/18 1934    acetaminophen (TYLENOL) 500 MG tablet  Every 8 hours PRN     10/27/18 1934           Frederica Kuster, PA-C 10/27/18 1938    Fredia Sorrow, MD 10/31/18 [phone]

## 2018-11-15 ENCOUNTER — Ambulatory Visit: Payer: 59 | Admitting: Allergy & Immunology

## 2018-12-05 ENCOUNTER — Other Ambulatory Visit (HOSPITAL_COMMUNITY): Payer: Self-pay | Admitting: Internal Medicine

## 2018-12-05 DIAGNOSIS — Z1231 Encounter for screening mammogram for malignant neoplasm of breast: Secondary | ICD-10-CM

## 2018-12-09 ENCOUNTER — Other Ambulatory Visit (HOSPITAL_COMMUNITY): Payer: Self-pay | Admitting: Nurse Practitioner

## 2018-12-09 ENCOUNTER — Ambulatory Visit (HOSPITAL_COMMUNITY)
Admission: RE | Admit: 2018-12-09 | Discharge: 2018-12-09 | Disposition: A | Discharge status: Home / Self Care | Payer: 59 | Source: Ambulatory Visit | Attending: Nurse Practitioner | Admitting: Nurse Practitioner

## 2018-12-09 ENCOUNTER — Other Ambulatory Visit: Payer: Self-pay

## 2018-12-09 DIAGNOSIS — M542 Cervicalgia: Secondary | ICD-10-CM

## 2018-12-16 ENCOUNTER — Ambulatory Visit (HOSPITAL_COMMUNITY)
Admission: RE | Admit: 2018-12-16 | Discharge: 2018-12-16 | Disposition: A | Discharge status: Home / Self Care | Payer: 59 | Source: Ambulatory Visit | Attending: Internal Medicine | Admitting: Internal Medicine

## 2018-12-16 ENCOUNTER — Other Ambulatory Visit: Payer: Self-pay

## 2018-12-16 DIAGNOSIS — Z1231 Encounter for screening mammogram for malignant neoplasm of breast: Secondary | ICD-10-CM | POA: Diagnosis present

## 2018-12-18 ENCOUNTER — Other Ambulatory Visit: Payer: Self-pay | Admitting: Orthopedic Surgery

## 2018-12-18 DIAGNOSIS — M25561 Pain in right knee: Secondary | ICD-10-CM

## 2019-01-01 ENCOUNTER — Encounter: Payer: Self-pay | Admitting: Allergy & Immunology

## 2019-01-01 ENCOUNTER — Other Ambulatory Visit: Payer: Self-pay

## 2019-01-01 ENCOUNTER — Ambulatory Visit (INDEPENDENT_AMBULATORY_CARE_PROVIDER_SITE_OTHER): Payer: 59 | Admitting: Allergy & Immunology

## 2019-01-01 VITALS — BP 106/74 | HR 84 | Temp 98.0°F | Resp 16 | Ht 60.0 in

## 2019-01-01 DIAGNOSIS — J302 Other seasonal allergic rhinitis: Secondary | ICD-10-CM

## 2019-01-01 DIAGNOSIS — J4540 Moderate persistent asthma, uncomplicated: Secondary | ICD-10-CM

## 2019-01-01 DIAGNOSIS — J3089 Other allergic rhinitis: Secondary | ICD-10-CM

## 2019-01-01 NOTE — Patient Instructions (Addendum)
1. Moderate persistent asthma, uncomplicated - Lung function looked stable today.  - We are not going to make any changes today.   - Daily controller medication(s): Symbicort 160/4.38mcg two puffs once daily - Prior to physical activity: ProAir 2 puffs 10-15 minutes before physical activity. - Rescue medications: ProAir 4 puffs every 4-6 hours as needed - Changes during respiratory infections or worsening symptoms: Increase Symbicort 160/4.5 to 2 puffs twice daily for ONE TO TWO WEEKS. - Asthma control goals:  * Full participation in all desired activities (may need albuterol before activity) * Albuterol use two time or less a week on average (not counting use with activity) * Cough interfering with sleep two time or less a month * Oral steroids no more than once a year * No hospitalizations  2. Perennial allergic rhinitis (grass, trees, mold, dust mite, and cat) - Continue with cetirizine 10mg  daily. - Continue with Flonase to one spray per nostril twice daily as needed.  - Continue with Astelin one spray per nostril twice daily as needed.  3. Return in about 6 months (around 07/01/2019). This can be an in-person, a virtual Webex or a telephone follow up visit.   Please inform us of any Emergency Department visits, hospitalizations, or changes in symptoms. Call us before going to the ED for breathing or allergy symptoms since we might be able to fit you in for a sick visit. Feel free to contact us anytime with any questions, problems, or concerns.  It was a pleasure to see you again today!  Websites that have reliable patient information: 1. American Academy of Asthma, Allergy, and Immunology: www.aaaai.org 2. Food Allergy Research and Education (FARE): foodallergy.org 3. Mothers of Asthmatics: http://www.asthmacommunitynetwork.org 4. American College of Allergy, Asthma, and Immunology: www.acaai.org  "Like" [place] on Facebook and Instagram for our latest updates!      Make sure you are  registered to vote! If you have moved or changed any of your contact information, you will need to get this updated before voting!  In some cases, you MAY be able to register to vote online: [email or web address]    Voter ID laws are NOT going into effect for the General Election in November 2020! DO NOT let this stop you from exercising your right to vote!   Absentee voting is the SAFEST way to vote during the coronavirus pandemic!   Download and print an absentee ballot request form at [email or web address] or you can scan the QR code below with your smart phone:      More information on absentee ballots can be found here: [email or web address]

## 2019-01-01 NOTE — Progress Notes (Signed)
FOLLOW UP  Date of Service/Encounter:  01/01/19   Assessment:   Moderate persistent asthma, uncomplicated  Seasonal and perennial allergic rhinitis(grass, trees, mold, dust mite, and cat)  Hypothyroidism  Osteopenia - incidental finding on X-ray    Asthma Reportables: Severity:moderate persistent Risk:low Control:well controlled    Plan/Recommendations:    1. Moderate persistent asthma, uncomplicated - Lung function looked stable today.  - We are not going to make any changes today.   - Daily controller medication(s): Symbicort 160/4.45mcg two puffs once daily - Prior to physical activity: ProAir 2 puffs 10-15 minutes before physical activity. - Rescue medications: ProAir 4 puffs every 4-6 hours as needed - Changes during respiratory infections or worsening symptoms: Increase Symbicort 160/4.5 to 2 puffs twice daily for ONE TO TWO WEEKS. - Asthma control goals:  * Full participation in all desired activities (may need albuterol before activity) * Albuterol use two time or less a week on average (not counting use with activity) * Cough interfering with sleep two time or less a month * Oral steroids no more than once a year * No hospitalizations  2. Perennial allergic rhinitis (grass, trees, mold, dust mite, and cat) - Continue with cetirizine 10mg  daily. - Continue with Flonase to one spray per nostril twice daily as needed.  - Continue with Astelin one spray per nostril twice daily as needed.  3. Return in about 6 months (around 07/01/2019). This can be an in-person, a virtual Webex or a telephone follow up visit.   Subjective:   Valerie Henderson is a 53 y.o. female presenting today for follow up of  Chief Complaint  Patient presents with  . Asthma    Valerie Henderson has a history of the following: Patient Active Problem List   Diagnosis Date Noted  . Vaginal dryness 02/01/2017  . Seasonal and perennial allergic rhinitis 12/26/2016  . Moderate  persistent asthma without complication 123456  . Elevated BP 06/10/2015  . Post-operative state 10/22/2013  . Endometriosis of pelvis 09/16/2013  . Cervical os stenosis 08/06/2013  . Menorrhagia 08/06/2013  . Screening for colorectal cancer 01/14/2013  . Pre-op testing 12/25/2012  . Fibromyalgia 12/16/2012  . Irregular bleeding 12/05/2012  . Hypothyroid 12/05/2012  . Constipation 12/05/2012    History obtained from: chart review and patient.  Valerie Henderson is a 53 y.o. female presenting for a follow up visit.  She was last seen in January 2020.  At that time, her lung function looks stable.  She was concerned that the Symbicort was contributing to her bone demineralization, but I did not feel this was the case.  We continued the Symbicort 160/4.5 mcg 2 puffs once daily.  We also continued with her increased 2 puffs twice daily during respiratory flares.  For her allergic rhinitis, we continued with cetirizine 10 mg daily and Flonase 1 spray per nostril twice daily.  We also added on Astelin 1 spray per nostril.   Since the last visit, she has done well. She does have a mask in place that has various locales around [place].   Asthma/Respiratory Symptom History: She has done well from an asthma perspective. She remains on the Symbicort two puffs once daily. She did increase to twice daily occasionally. It has prevented her from needing prednisone. She does have albuterol on hand to use as needed, but she rarely needed to use it  Allergic Rhinitis Symptom History: She remains on the nasal sprays, but she has not used it in quite some time.  She thinks that her symptoms are better controlled at this time. Staying in the house has helped a bit. She has not had any antibiotics for sinus infections.   She did talk to her PCP about the bone mineralization. They reported that all of the blood work was good. They were not concerned enough to get a bone density test. Apparently they felt that this was  within the realm of normal. She is going to start having bone density tests once she is 53 years old.    Otherwise, there have been no changes to her past medical history, surgical history, family history, or social history. 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 She is only hosting one client at a time at this point. But she is happy to even be able to open.    Review of Systems  Constitutional: Negative.  Negative for chills, fever, malaise/fatigue and weight loss.  HENT: Negative.  Negative for congestion, ear discharge, ear pain, sinus pain and sore throat.   Eyes: Negative for pain, discharge and redness.  Respiratory: Negative for cough, sputum production, shortness of breath and wheezing.   Cardiovascular: Negative.  Negative for chest pain and palpitations.  Gastrointestinal: Negative for abdominal pain, heartburn, nausea and vomiting.  Skin: Negative.  Negative for itching and rash.  Neurological: Negative for dizziness and headaches.  Endo/Heme/Allergies: Negative for environmental allergies. Does not bruise/bleed easily.       Objective:   Blood pressure 106/74, pulse 84, temperature 98 F (36.7 C), temperature source Temporal, resp. rate 16, height 5' (1.524 m), last menstrual period 08/15/2013, SpO2 96 %. Body mass index is 37.89 kg/m.   Physical Exam:  Physical Exam  Constitutional: She appears well-developed.  Pleasant female.   HENT:  Head: Normocephalic and atraumatic.  Right Ear: Tympanic membrane, external ear and ear canal normal.  Left Ear: Tympanic membrane, external ear and ear canal normal.  Nose: Mucosal edema and rhinorrhea present. No nasal deformity or septal deviation. No epistaxis. Right sinus exhibits no maxillary sinus tenderness and no frontal sinus tenderness. Left sinus exhibits no maxillary sinus tenderness and no frontal sinus tenderness.  Mouth/Throat: Uvula is midline and oropharynx is clear and moist. Mucous membranes are not  pale and not dry.  Bilateral turbinate edema. t  Eyes: Pupils are equal, round, and reactive to light. Conjunctivae and EOM are normal. Right eye exhibits no chemosis and no discharge. Left eye exhibits no chemosis and no discharge. Right conjunctiva is not injected. Left conjunctiva is not injected.  Cardiovascular: Normal rate, regular rhythm and normal heart sounds.  Respiratory: Effort normal and breath sounds normal. No accessory muscle usage. No tachypnea. No respiratory distress. She has no wheezes. She has no rhonchi. She has no rales. She exhibits no tenderness.  Lymphadenopathy:    She has no cervical adenopathy.  Neurological: She is alert.  Skin: No abrasion, no petechiae and no rash noted. Rash is not papular, not vesicular and not urticarial. No erythema. No pallor.  There are no urticarial or eczematous lesions noted.   Psychiatric: She has a normal mood and affect.     Diagnostic studies:    Spirometry: results abnormal (FEV1: 1.32/63%, FVC: 2.19/79%, FEV1/FVC: 60%).    Spirometry consistent with mixed obstructive and restrictive disease.   Allergy Studies: none     Salvatore Marvel, MD  [facility]

## 2019-01-02 NOTE — Addendum Note (Signed)
Addended by: Neomia Dear on: 01/02/2019 10:45 AM   Modules accepted: Orders

## 2019-01-15 ENCOUNTER — Ambulatory Visit
Admission: RE | Admit: 2019-01-15 | Discharge: 2019-01-15 | Disposition: A | Discharge status: Home / Self Care | Payer: 59 | Source: Ambulatory Visit | Attending: Orthopedic Surgery | Admitting: Orthopedic Surgery

## 2019-01-15 ENCOUNTER — Other Ambulatory Visit: Payer: Self-pay

## 2019-01-15 DIAGNOSIS — M25561 Pain in right knee: Secondary | ICD-10-CM

## 2019-01-28 ENCOUNTER — Other Ambulatory Visit: Payer: Self-pay

## 2019-01-28 ENCOUNTER — Telehealth: Payer: Self-pay | Admitting: Allergy & Immunology

## 2019-01-28 MED ORDER — BUDESONIDE-FORMOTEROL FUMARATE 160-4.5 MCG/ACT IN AERO: INHALATION_SPRAY | RESPIRATORY_TRACT | 5 refills | Status: DC

## 2019-01-28 MED ORDER — ALBUTEROL SULFATE HFA 108 (90 BASE) MCG/ACT IN AERS: INHALATION_SPRAY | RESPIRATORY_TRACT | 1 refills | Status: DC

## 2019-01-28 NOTE — Telephone Encounter (Signed)
Refills sent to requested pharmacy. 

## 2019-01-28 NOTE — Telephone Encounter (Signed)
Pt called and needs to have albuterol and symbicort called to [facility] [address]. [phone].

## 2019-02-12 ENCOUNTER — Other Ambulatory Visit (HOSPITAL_COMMUNITY): Payer: Self-pay | Admitting: Internal Medicine

## 2019-02-13 DIAGNOSIS — M25562 Pain in left knee: Secondary | ICD-10-CM | POA: Diagnosis not present

## 2019-02-13 DIAGNOSIS — M25561 Pain in right knee: Secondary | ICD-10-CM | POA: Diagnosis not present

## 2019-02-14 ENCOUNTER — Encounter: Payer: Self-pay | Admitting: Adult Health

## 2019-02-14 ENCOUNTER — Ambulatory Visit (INDEPENDENT_AMBULATORY_CARE_PROVIDER_SITE_OTHER): Payer: BC Managed Care – PPO | Admitting: Adult Health

## 2019-02-14 ENCOUNTER — Other Ambulatory Visit (HOSPITAL_COMMUNITY)
Admission: RE | Admit: 2019-02-14 | Discharge: 2019-02-14 | Disposition: A | Discharge status: Home / Self Care | Payer: BC Managed Care – PPO | Source: Ambulatory Visit | Attending: Adult Health | Admitting: Adult Health

## 2019-02-14 ENCOUNTER — Other Ambulatory Visit: Payer: Self-pay

## 2019-02-14 VITALS — BP 123/73 | HR 68 | Ht 58.5 in | Wt 197.5 lb

## 2019-02-14 DIAGNOSIS — Z1212 Encounter for screening for malignant neoplasm of rectum: Secondary | ICD-10-CM | POA: Diagnosis not present

## 2019-02-14 DIAGNOSIS — Z1211 Encounter for screening for malignant neoplasm of colon: Secondary | ICD-10-CM | POA: Diagnosis not present

## 2019-02-14 DIAGNOSIS — Z01419 Encounter for gynecological examination (general) (routine) without abnormal findings: Secondary | ICD-10-CM | POA: Diagnosis not present

## 2019-02-14 LAB — HEMOCCULT GUIAC POC 1CARD (OFFICE): Fecal Occult Blood, POC: NEGATIVE

## 2019-02-14 NOTE — Progress Notes (Signed)
Patient ID: Valerie Henderson, female   DOB: [birth year], 53 y.o.   MRN: [id number] History of Present Illness:  Valerie Henderson is a 53 year old white female, married, spp [facility] in for a well woman gyn exam and pap.She is a Emergency planning/management officer. PCP is Dr Nevada Crane  Current Medications, Allergies, Past Medical History, Past Surgical History, Family History and Social History were reviewed in [facility] record.     Review of Systems: Patient denies any headaches, hearing loss, fatigue, blurred vision, shortness of breath, chest pain, abdominal pain, problems with bowel movements, urination, or intercourse. No joint pain or mood swings.    Physical Exam:BP 123/73 (BP Location: Left Arm, Patient Position: Sitting, Cuff Size: Normal)   Pulse 68   Ht 4' 10.5" (1.486 m)   Wt 197 lb 8 oz (89.6 kg)   LMP 08/15/2013 Comment: [place]  BMI 40.57 kg/m  General:  Well developed, well nourished, no acute distress Skin:  Warm and dry Neck:  Midline trachea, normal thyroid, good ROM, no lymphadenopathy Lungs; Clear to auscultation bilaterally Breast:  No dominant palpable mass, retraction, or nipple discharge Cardiovascular: Regular rate and rhythm Abdomen:  Soft, non tender, no hepatosplenomegaly Pelvic:  External genitalia is normal in appearance, no lesions. Right inner labia slightly longer that left she noticed this, no pain or pulling. The vagina is normal in appearance. Urethra has no lesions or masses. The cervix is bulbous and smooth , pap with high risk HPV and 16/18 genotyping performed by [employer] FNP student.  Uterus is felt to be normal size, shape, and contour.  No adnexal masses or tenderness noted.Bladder is non tender, no masses felt. Rectal: Good sphincter tone, no polyps, or hemorrhoids felt.  Hemoccult negative. Extremities/musculoskeletal:  No swelling + varicosities noted, no clubbing or cyanosis Psych:  No mood changes, alert and cooperative,seems happy Fall risk is low  PHQ 2 score  0. Exam by [employer] Croon FNP student, under my supervision. Discussed if she wants right inner labia trimmed to call would be out patient procedure.   Impression and Plan: 1. Encounter for gynecological examination with Papanicolaou smear of cervix Pap sent physical in 1 year Pap in 3 if normal Mammogram yearly Labs with PCP Colonoscopy per GI  2. Screening for colorectal cancer

## 2019-02-20 LAB — CYTOLOGY - PAP
Comment: NEGATIVE
Diagnosis: NEGATIVE
High risk HPV: NEGATIVE

## 2019-03-07 ENCOUNTER — Other Ambulatory Visit: Payer: Self-pay

## 2019-03-07 ENCOUNTER — Ambulatory Visit (HOSPITAL_COMMUNITY): Payer: BC Managed Care – PPO | Attending: Orthopedic Surgery

## 2019-03-07 ENCOUNTER — Encounter (HOSPITAL_COMMUNITY): Payer: Self-pay

## 2019-03-07 DIAGNOSIS — G8929 Other chronic pain: Secondary | ICD-10-CM

## 2019-03-07 DIAGNOSIS — M25562 Pain in left knee: Secondary | ICD-10-CM | POA: Insufficient documentation

## 2019-03-07 DIAGNOSIS — M25561 Pain in right knee: Secondary | ICD-10-CM | POA: Diagnosis not present

## 2019-03-07 NOTE — Therapy (Signed)
[place], [place], [address] Phone: [phone]   Fax:  [phone]  Physical Therapy Evaluation  Patient Details  Name: Valerie Henderson MRN: [id number] Date of Birth: 06-01-1965 Referring Provider (PT): Gaynelle Arabian, MD   Encounter Date: 03/07/2019  PT End of Session - 03/07/19 0855    Visit Number  1    Number of Visits  8    Date for PT Re-Evaluation  04/04/19    Authorization Type  BCBS    Authorization Time Period  03/07/19 to 04/04/19   30 visit limit PT/OT combined, 0 used   PT Start Time  0815    PT Stop Time  0855    PT Time Calculation (min)  40 min    Activity Tolerance  Patient tolerated treatment well    Behavior During Therapy  [facility] for tasks assessed/performed       Past Medical History:  Diagnosis Date  . Anemia   . Asthma   . Complication of anesthesia   . Constipation 12/05/2012  . Elevated BP 06/10/2015  . Endometriosis   . Fibromyalgia 12/16/2012  . GERD (gastroesophageal reflux disease)   . Hypothyroid 12/05/2012  . Hypothyroidism   . Insomnia   . Irregular bleeding 12/05/2012   Endometrial  ablation 9/14  . PONV (postoperative nausea and vomiting)     Past Surgical History:  Procedure Laterality Date  . CHOLECYSTECTOMY    . DILITATION & CURRETTAGE/HYSTROSCOPY WITH THERMACHOICE ABLATION N/A 01/14/2013   Procedure: DILATATION & CURETTAGE/HYSTEROSCOPY WITH THERMACHOICE ABLATION;  Surgeon: Jonnie Kind, MD;  Location: [facility];  Service: Gynecology;  Laterality: N/A;  8 ml D5W total in, 8 ml D5W total out; 86-88 degrees Celcius; 8 minutes and 39 seconds total time  . LAPAROSCOPIC BILATERAL SALPINGECTOMY Bilateral 01/14/2013   Procedure: LAPAROSCOPIC BILATERAL SALPINGECTOMY;  Surgeon: Jonnie Kind, MD;  Location: [facility];  Service: Gynecology;  Laterality: Bilateral;  . OOPHORECTOMY Bilateral 09/16/2013   Procedure: OOPHORECTOMY;  Surgeon: Jonnie Kind, MD;  Location: [facility];  Service:  Gynecology;  Laterality: Bilateral;  . SUPRACERVICAL ABDOMINAL HYSTERECTOMY N/A 09/16/2013   Procedure: HYSTERECTOMY SUPRACERVICAL ABDOMINAL;  Surgeon: Jonnie Kind, MD;  Location: [facility];  Service: Gynecology;  Laterality: N/A;    There were no vitals filed for this visit.   Subjective Assessment - 03/07/19 0819    Subjective  Pt reports she injured one of her knees a long time ago, but can't remember which one. Pt reports it took a long time to heal, but the doctor said she didn't injur anything. Pt reports R leg feels weaker so she depends on L with stairs and rising from the floor. Pt reports pain in both knees.  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 Pt reports increased knee pain once rising from prolonged sitting with travelling and with stairs. Pt denies increase in pain with static standing. Pt reports pain isn't all the time, limits her a couple times a day. Pt reports rest and biofreeze help alleviate pain. Pt reports R knee buckles occasionally. Pt reports surgery is not on the table currently, has never had injections for pain.    Limitations  Walking;Other (comment)   steps   How long can you sit comfortably?  no issues, after rising from static sitting    How long can you stand comfortably?  no issues    How long can you  walk comfortably?  no issues    Diagnostic tests  R knee MRI - mild cartialge irrgularity in medial and patellofemoral compartments, prepatellar edema (bursitis)    Patient Stated Goals  "he just thinks if I can strengthen it some then it can help"    Currently in Pain?  Yes    Pain Score  3     Pain Location  Knee    Pain Orientation  Right;Left    Pain Descriptors / Indicators  Other (Comment);Nagging   "sting"   Pain Type  Chronic pain    Pain Onset  More than a month ago    Pain Frequency  Constant    Aggravating Factors   steps    Pain Relieving Factors  rest    Effect of Pain on Daily Activities  limited          [facility] PT Assessment - 03/07/19 0001      Assessment   Medical Diagnosis  bil knee pain    Referring Provider (PT)  Gaynelle Arabian, MD    Onset Date/Surgical Date  --   a couple of years, keeps getting worse   Next MD Visit  do therapy, call if not better    Prior Therapy  none      Precautions   Precautions  None      Restrictions   Weight Bearing Restrictions  No      Balance Screen   Has the patient fallen in the past 6 months  No    Has the patient had a decrease in activity level because of a fear of falling?   No    Is the patient reluctant to leave their home because of a fear of falling?   No      Prior Function   Level of Independence  Independent    Vocation  Full time employment    Vocation Requirements  hair stylist, standing/walking    Leisure  camping      Cognition   Overall Cognitive Status  Within Functional Limits for tasks assessed      Observation/Other Assessments   Focus on Therapeutic Outcomes (FOTO)   25% limited      Sensation   Light Touch  Appears Intact      Functional Tests   Functional tests  Sit to Stand      Sit to Stand   Comments  5xSTS: 6.9 sec, from chair, no UE assist, increased pain after      ROM / Strength   AROM / PROM / Strength  AROM;Strength      AROM   AROM Assessment Site  Knee    Right/Left Knee  Right;Left    Right Knee Extension  4   hyperextension   Right Knee Flexion  106    Left Knee Extension  4   hyperextension   Left Knee Flexion  106      Strength   Strength Assessment Site  Hip;Knee;Ankle    Right Hip Flexion  4+/5    Right Hip Extension  4+/5    Right Hip ABduction  5/5    Left Hip Flexion  4+/5    Left Hip Extension  4+/5    Left Hip ABduction  5/5    Right Knee Flexion  4+/5    Right Knee Extension  5/5    Left Knee Flexion  4+/5    Left Knee Extension  5/5    Right Ankle Dorsiflexion  5/5  Left Ankle Dorsiflexion  5/5      Flexibility   Soft Tissue Assessment /Muscle Length   yes    Hamstrings  WNL bil    ITB  WNL bil    Piriformis  WNL bil      Palpation   Patella mobility  good mobility bilaterally    Palpation comment  tenderness to palpation throughout medial joint line on R knee, denies pain with patellar mobs and palpation      Ambulation/Gait   Stairs  Yes    Stair Management Technique  One rail Right;Alternating pattern;Step to pattern;Other (comment)    Number of Stairs  8    Height of Stairs  6    Gait Comments  no knee buckling, equal bil step length, step through pattern with level ground gait; early heel rise with uncontrolled descent with stair negotiaiton, step to pattern ascending with LLE leading due to RLE weakness, prefers HHA on handrail but able to perform without, slight increase in bil genu valgus with stairs      Balance   Balance Assessed  Yes      Static Standing Balance   Static Standing - Balance Support  No upper extremity supported    Static Standing Balance -  Activities   Single Leg Stance - Right Leg;Single Leg Stance - Left Leg    Static Standing - Comment/# of Minutes  bil 30+ sec           Objective measurements completed on examination: See above findings.      [facility] PT Treatment/Exercise - 03/07/19 0001      Exercises   Exercises  Knee/Hip      Knee/Hip Exercises: Supine   Quad Sets  Both;5 sets   3 sec hold         PT Education - 03/07/19 0826    Education Details  Assessment findings, POC, initiated HEP, FOTO findings    Person(s) Educated  Patient    Methods  Explanation;Handout    Comprehension  Verbalized understanding       PT Short Term Goals - 03/07/19 0945      PT SHORT TERM GOAL #1   Title  Pt will be consistent with HEP to improve strength and reduce pain with functional mobility.    Time  2    Period  Weeks    Status  New    Target Date  03/21/19        PT Long Term Goals - 03/07/19 1040      PT LONG TERM GOAL #1   Title  Pt will perform stair ascent/descent with  single handrail, reciprocal pattern, and good motor control to demo improved functional strength.    Time  4    Period  Weeks    Status  New    Target Date  04/04/19      PT LONG TERM GOAL #2   Title  Pt will report 2/10 pain in bil knees at worst with standing and ambulation during shift at work to improve ability to complete work related tasks.    Time  4    Period  Weeks    Status  New      PT LONG TERM GOAL #3   Title  Pt will self report R knee buckling <3 times since starting physical therapy to indicate improvement in functional strength and improve ambulation tolerance.    Time  4    Period  Weeks  Status  New      PT LONG TERM GOAL #4   Title  Pt will be able to stoop, bend, squat to floor to retrieve objects with 2/10 pain in bil knees at worst to allow her to perform household chores.    Time  4    Period  Weeks    Status  New             Plan - 03/07/19 0858    Clinical Impression Statement  Pt is a pleasant 53 YO female with bil knee pain. Pt demonstrates deficits in bil knee flexion, strength, and ambulation. Pt demonstrates closed chain weakness with uncontrolled stair descent due to quad weakness and pain in bilateral knees, R worse than L. Pt with good transfer technique, but with increased pain at end of reps. Pt educated on HEP and able to perform quad sets appropriately with verbal cues. Pt would benefit from skilled PT interventions to improve deficits noted and reduce pain with functional mobility to improve QoL.    Personal Factors and Comorbidities  Comorbidity 1    Comorbidities  fibromyalgia    Examination-Activity Limitations  Locomotion Level;Stairs    Examination-Participation Restrictions  Community Activity    Stability/Clinical Decision Making  Stable/Uncomplicated    Clinical Decision Making  Low    Rehab Potential  Good    PT Frequency  2x / week    PT Duration  4 weeks    PT Treatment/Interventions  ADLs/Self Care Home Management;Aquatic  Therapy;Biofeedback;Cryotherapy;Electrical Stimulation;Moist Heat;Ultrasound;DME Instruction;Gait training;Stair training;Functional mobility training;Therapeutic activities;Therapeutic exercise;Balance training;Neuromuscular re-education;Patient/family education;Orthotic Fit/Training;Manual techniques;Passive range of motion;Dry needling;Taping;Joint Manipulations    PT Next Visit Plan  Review goals, HEP. Begin bil quad, hamstring and glute strengthening. Start with low height with step ups and progress height as able.    PT Home Exercise Plan  Eval: bridges, quad sets, standing heel raise    Consulted and Agree with Plan of Care  Patient       Patient will benefit from skilled therapeutic intervention in order to improve the following deficits and impairments:  Decreased activity tolerance, Decreased range of motion, Decreased strength, Difficulty walking, Impaired perceived functional ability, Pain  Visit Diagnosis: Chronic pain of right knee  Chronic pain of left knee     Problem List Patient Active Problem List   Diagnosis Date Noted  . Encounter for gynecological examination with Papanicolaou smear of cervix 02/14/2019  . Vaginal dryness 02/01/2017  . Seasonal and perennial allergic rhinitis 12/26/2016  . Moderate persistent asthma without complication 123456  . Elevated BP 06/10/2015  . Post-operative state 10/22/2013  . Endometriosis of pelvis 09/16/2013  . Cervical os stenosis 08/06/2013  . Menorrhagia 08/06/2013  . Screening for colorectal cancer 01/14/2013  . Pre-op testing 12/25/2012  . Fibromyalgia 12/16/2012  . Irregular bleeding 12/05/2012  . Hypothyroid 12/05/2012  . Constipation 12/05/2012    Talbot Grumbling PT, DPT 03/07/19, 10:46 AM [facility] [address] [place], [place], [address] Phone: [phone]   Fax:  [phone]  Name: Valerie Henderson MRN: [id number] Date of Birth: 05-10-1965

## 2019-03-11 ENCOUNTER — Encounter (HOSPITAL_COMMUNITY): Payer: Self-pay | Admitting: Physical Therapy

## 2019-03-11 ENCOUNTER — Ambulatory Visit (HOSPITAL_COMMUNITY): Payer: BC Managed Care – PPO | Admitting: Physical Therapy

## 2019-03-11 ENCOUNTER — Telehealth (HOSPITAL_COMMUNITY): Payer: Self-pay | Admitting: Physical Therapy

## 2019-03-11 ENCOUNTER — Other Ambulatory Visit: Payer: Self-pay

## 2019-03-11 DIAGNOSIS — G8929 Other chronic pain: Secondary | ICD-10-CM

## 2019-03-11 DIAGNOSIS — M25561 Pain in right knee: Secondary | ICD-10-CM

## 2019-03-11 DIAGNOSIS — M25562 Pain in left knee: Secondary | ICD-10-CM | POA: Diagnosis not present

## 2019-03-11 NOTE — Telephone Encounter (Signed)
pt called back to cancel appt for tomorrow  stated that the reason is she is just not going to be able to make the appt.

## 2019-03-11 NOTE — Therapy (Signed)
[place], [place], [address] Phone: [phone]   Fax:  [phone]  Physical Therapy Treatment  Patient Details  Name: Valerie Henderson MRN: [id number] Date of Birth: [birth year] Referring Provider (PT): Gaynelle Arabian, MD   Encounter Date: 03/11/2019  PT End of Session - 03/11/19 0912    Visit Number  2    Number of Visits  8    Date for PT Re-Evaluation  04/04/19    Authorization Type  BCBS    Authorization Time Period  03/07/19 to 04/04/19   30 visit limit PT/OT combined, 0 used   PT Start Time  0901    PT Stop Time  0941    PT Time Calculation (min)  40 min    Activity Tolerance  Patient tolerated treatment well    Behavior During Therapy  [facility] for tasks assessed/performed       Past Medical History:  Diagnosis Date  . Anemia   . Asthma   . Complication of anesthesia   . Constipation 12/05/2012  . Elevated BP 06/10/2015  . Endometriosis   . Fibromyalgia 12/16/2012  . GERD (gastroesophageal reflux disease)   . Hypothyroid 12/05/2012  . Hypothyroidism   . Insomnia   . Irregular bleeding 12/05/2012   Endometrial  ablation 9/14  . PONV (postoperative nausea and vomiting)     Past Surgical History:  Procedure Laterality Date  . CHOLECYSTECTOMY    . DILITATION & CURRETTAGE/HYSTROSCOPY WITH THERMACHOICE ABLATION N/A 01/14/2013   Procedure: DILATATION & CURETTAGE/HYSTEROSCOPY WITH THERMACHOICE ABLATION;  Surgeon: Jonnie Kind, MD;  Location: [facility];  Service: Gynecology;  Laterality: N/A;  8 ml D5W total in, 8 ml D5W total out; 86-88 degrees Celcius; 8 minutes and 39 seconds total time  . LAPAROSCOPIC BILATERAL SALPINGECTOMY Bilateral 01/14/2013   Procedure: LAPAROSCOPIC BILATERAL SALPINGECTOMY;  Surgeon: Jonnie Kind, MD;  Location: [facility];  Service: Gynecology;  Laterality: Bilateral;  . OOPHORECTOMY Bilateral 09/16/2013   Procedure: OOPHORECTOMY;  Surgeon: Jonnie Kind, MD;  Location: [facility];  Service:  Gynecology;  Laterality: Bilateral;  . SUPRACERVICAL ABDOMINAL HYSTERECTOMY N/A 09/16/2013   Procedure: HYSTERECTOMY SUPRACERVICAL ABDOMINAL;  Surgeon: Jonnie Kind, MD;  Location: [facility];  Service: Gynecology;  Laterality: N/A;    There were no vitals filed for this visit.  Subjective Assessment - 03/11/19 0910    Subjective  Patient states "It's about the same". Patient reports pain at 1 or 2. Patient reports compliance with HEP with no issues.    Limitations  Walking;Other (comment)   steps   How long can you sit comfortably?  no issues, after rising from static sitting    How long can you stand comfortably?  no issues    How long can you walk comfortably?  no issues    Diagnostic tests  R knee MRI - mild cartialge irrgularity in medial and patellofemoral compartments, prepatellar edema (bursitis)    Patient Stated Goals  "he just thinks if I can strengthen it some then it can help"    Currently in Pain?  Yes    Pain Score  2     Pain Location  Knee    Pain Orientation  Right;Left;Anterior;Medial    Pain Descriptors / Indicators  Aching    Pain Type  Chronic pain    Pain Onset  More than a month ago  [facility] Adult PT Treatment/Exercise - 03/11/19 0001      Knee/Hip Exercises: Stretches   Gastroc Stretch  Both;3 reps;30 seconds    Gastroc Stretch Limitations  slant board      Knee/Hip Exercises: Standing   Heel Raises  Both;20 reps    Hip ADduction  Both;15 reps    Hip Extension  Both;15 reps    Forward Step Up  Both;15 reps;Step Height: 4"    Functional Squat  15 reps    Functional Squat Limitations  half reps    SLS  3 x 15" each on foam     Other Standing Knee Exercises  Tandem stance; 3 x 30" each, on foam       Knee/Hip Exercises: Supine   Quad Sets  Both;15 reps    Quad Sets Limitations  5 sec hold    Heel Slides  Both;15 reps;AROM    Bridges  Both;20 reps    Straight Leg Raises  Both;15 reps             PT Education  - 03/11/19 0911    Education Details  Patient educated on HEP review and exercise technique of added exercise. Patient educated on goals.    Person(s) Educated  Patient    Methods  Explanation    Comprehension  Verbalized understanding       PT Short Term Goals - 03/11/19 1106      PT SHORT TERM GOAL #1   Title  Pt will be consistent with HEP to improve strength and reduce pain with functional mobility.    Time  2    Period  Weeks    Status  On-going    Target Date  03/21/19        PT Long Term Goals - 03/11/19 1106      PT LONG TERM GOAL #1   Title  Pt will perform stair ascent/descent with single handrail, reciprocal pattern, and good motor control to demo improved functional strength.    Time  4    Period  Weeks    Status  On-going    Target Date  04/04/19      PT LONG TERM GOAL #2   Title  Pt will report 2/10 pain in bil knees at worst with standing and ambulation during shift at work to improve ability to complete work related tasks.    Time  4    Period  Weeks    Status  On-going    Target Date  04/04/19      PT LONG TERM GOAL #3   Title  Pt will self report R knee buckling <3 times since starting physical therapy to indicate improvement in functional strength and improve ambulation tolerance.    Time  4    Period  Weeks    Status  On-going    Target Date  04/04/19      PT LONG TERM GOAL #4   Title  Pt will be able to stoop, bend, squat to floor to retrieve objects with 2/10 pain in bil knees at worst to allow her to perform household chores.    Time  4    Period  Weeks    Status  On-going    Target Date  04/04/19            Plan - 03/11/19 0947    Clinical Impression Statement  Patient tolerated treatment well today. Treatment initiated this visit. Reviewed patient goals and HEP. Progressed baolance and  BLE strengtheing exercise. Patient demos good static balance with tandem stance and SLS on foam pad. Patient did note mild "pulling" in RT knee during  SLR. Patient was educated on proper form and function of all added exercise. Patient required verbal cues for proper mechanics during mini squats to avoid RT knee pain. Patient had no increased pain with step ups on 4 inch box. Plan to progress step height and BLE strengthening as tolerated.    Personal Factors and Comorbidities  Comorbidity 1    Comorbidities  fibromyalgia    Examination-Activity Limitations  Locomotion Level;Stairs    Examination-Participation Restrictions  Community Activity    Stability/Clinical Decision Making  Stable/Uncomplicated    Rehab Potential  Good    PT Frequency  2x / week    PT Duration  4 weeks    PT Treatment/Interventions  ADLs/Self Care Home Management;Aquatic Therapy;Biofeedback;Cryotherapy;Electrical Stimulation;Moist Heat;Ultrasound;DME Instruction;Gait training;Stair training;Functional mobility training;Therapeutic activities;Therapeutic exercise;Balance training;Neuromuscular re-education;Patient/family education;Orthotic Fit/Training;Manual techniques;Passive range of motion;Dry needling;Taping;Joint Manipulations    PT Next Visit Plan  Progress step height to 6 in. Add BLE strengthening as tolerated.    PT Home Exercise Plan  Eval: bridges, quad sets, standing heel raise; 03/11/19: heel slides    Consulted and Agree with Plan of Care  Patient       Patient will benefit from skilled therapeutic intervention in order to improve the following deficits and impairments:  Decreased activity tolerance, Decreased range of motion, Decreased strength, Difficulty walking, Impaired perceived functional ability, Pain  Visit Diagnosis: Chronic pain of right knee  Chronic pain of left knee     Problem List Patient Active Problem List   Diagnosis Date Noted  . Encounter for gynecological examination with Papanicolaou smear of cervix 02/14/2019  . Vaginal dryness 02/01/2017  . Seasonal and perennial allergic rhinitis 12/26/2016  . Moderate persistent asthma  without complication 123456  . Elevated BP 06/10/2015  . Post-operative state 10/22/2013  . Endometriosis of pelvis 09/16/2013  . Cervical os stenosis 08/06/2013  . Menorrhagia 08/06/2013  . Screening for colorectal cancer 01/14/2013  . Pre-op testing 12/25/2012  . Fibromyalgia 12/16/2012  . Irregular bleeding 12/05/2012  . Hypothyroid 12/05/2012  . Constipation 12/05/2012   11:17 AM, 03/11/19 Josue Hector PT DPT  Physical Therapist with [facility]  [phone] [place] [address] [place], [place], [address] Phone: [phone]   Fax:  [phone]  Name: VANDANA SNELLING MRN: [id number] Date of Birth: 1965-12-01

## 2019-03-12 ENCOUNTER — Encounter (HOSPITAL_COMMUNITY): Payer: BC Managed Care – PPO | Admitting: Physical Therapy

## 2019-03-18 ENCOUNTER — Other Ambulatory Visit: Payer: Self-pay

## 2019-03-18 ENCOUNTER — Encounter (HOSPITAL_COMMUNITY): Payer: Self-pay | Admitting: Physical Therapy

## 2019-03-18 ENCOUNTER — Ambulatory Visit (HOSPITAL_COMMUNITY): Payer: BC Managed Care – PPO | Admitting: Physical Therapy

## 2019-03-18 DIAGNOSIS — G8929 Other chronic pain: Secondary | ICD-10-CM

## 2019-03-18 DIAGNOSIS — M25562 Pain in left knee: Secondary | ICD-10-CM | POA: Diagnosis not present

## 2019-03-18 DIAGNOSIS — M25561 Pain in right knee: Secondary | ICD-10-CM

## 2019-03-18 NOTE — Therapy (Signed)
[place], [place], [address] Phone: [phone]   Fax:  [phone]  Physical Therapy Treatment  Patient Details  Name: Valerie Henderson MRN: [id number] Date of Birth: 08/18/1965 Referring Provider (PT): Gaynelle Arabian, MD   Encounter Date: 03/18/2019  PT End of Session - 03/18/19 0821    Visit Number  3    Number of Visits  8    Date for PT Re-Evaluation  04/04/19    Authorization Type  BCBS    Authorization Time Period  03/07/19 to 04/04/19   30 visit limit PT/OT combined, 0 used   PT Start Time  0818    PT Stop Time  0857    PT Time Calculation (min)  39 min    Activity Tolerance  Patient tolerated treatment well    Behavior During Therapy  [facility] for tasks assessed/performed       Past Medical History:  Diagnosis Date  . Anemia   . Asthma   . Complication of anesthesia   . Constipation 12/05/2012  . Elevated BP 06/10/2015  . Endometriosis   . Fibromyalgia 12/16/2012  . GERD (gastroesophageal reflux disease)   . Hypothyroid 12/05/2012  . Hypothyroidism   . Insomnia   . Irregular bleeding 12/05/2012   Endometrial  ablation 9/14  . PONV (postoperative nausea and vomiting)     Past Surgical History:  Procedure Laterality Date  . CHOLECYSTECTOMY    . DILITATION & CURRETTAGE/HYSTROSCOPY WITH THERMACHOICE ABLATION N/A 01/14/2013   Procedure: DILATATION & CURETTAGE/HYSTEROSCOPY WITH THERMACHOICE ABLATION;  Surgeon: Jonnie Kind, MD;  Location: [facility];  Service: Gynecology;  Laterality: N/A;  8 ml D5W total in, 8 ml D5W total out; 86-88 degrees Celcius; 8 minutes and 39 seconds total time  . LAPAROSCOPIC BILATERAL SALPINGECTOMY Bilateral 01/14/2013   Procedure: LAPAROSCOPIC BILATERAL SALPINGECTOMY;  Surgeon: Jonnie Kind, MD;  Location: [facility];  Service: Gynecology;  Laterality: Bilateral;  . OOPHORECTOMY Bilateral 09/16/2013   Procedure: OOPHORECTOMY;  Surgeon: Jonnie Kind, MD;  Location: [facility];  Service:  Gynecology;  Laterality: Bilateral;  . SUPRACERVICAL ABDOMINAL HYSTERECTOMY N/A 09/16/2013   Procedure: HYSTERECTOMY SUPRACERVICAL ABDOMINAL;  Surgeon: Jonnie Kind, MD;  Location: [facility];  Service: Gynecology;  Laterality: N/A;    There were no vitals filed for this visit.  Subjective Assessment - 03/18/19 0821    Subjective  Patient reports with no new complaints. Patient reports no pain currently.    Limitations  Walking;Other (comment)   steps   How long can you sit comfortably?  no issues, after rising from static sitting    How long can you stand comfortably?  no issues    How long can you walk comfortably?  no issues    Diagnostic tests  R knee MRI - mild cartialge irrgularity in medial and patellofemoral compartments, prepatellar edema (bursitis)    Patient Stated Goals  "he just thinks if I can strengthen it some then it can help"    Currently in Pain?  No/denies    Pain Onset  More than a month ago                       [facility] Adult PT Treatment/Exercise - 03/18/19 0001      Knee/Hip Exercises: Stretches   Knee: Self-Stretch to increase Flexion  Both;5 reps;10 seconds    Knee: Self-Stretch Limitations  on 12 inch step    Gastroc Stretch  Both;3 reps;30  seconds    Gastroc Stretch Limitations  slant board      Knee/Hip Exercises: Standing   Heel Raises  Both;20 reps    Hip Abduction  Both;20 reps    Hip Extension  Both;20 reps    Forward Step Up  Both;15 reps;Step Height: 6"    Step Down  Both;1 set;Step Height: 6";15 reps    Functional Squat  20 reps    Functional Squat Limitations  half reps    SLS  3 x 15" each on foam     Other Standing Knee Exercises  Tandem stance; 3 x 30" each, on foam     Other Standing Knee Exercises  Sidestepping; red band; 15' 3 xRT      Knee/Hip Exercises: Supine   Quad Sets  Both;10 reps    Quad Sets Limitations  5 sec hold    Heel Slides  Both;AROM;20 reps    Bridges  Both;20 reps    Straight Leg Raises  Both;20 reps                PT Short Term Goals - 03/11/19 1106      PT SHORT TERM GOAL #1   Title  Pt will be consistent with HEP to improve strength and reduce pain with functional mobility.    Time  2    Period  Weeks    Status  On-going    Target Date  03/21/19        PT Long Term Goals - 03/11/19 1106      PT LONG TERM GOAL #1   Title  Pt will perform stair ascent/descent with single handrail, reciprocal pattern, and good motor control to demo improved functional strength.    Time  4    Period  Weeks    Status  On-going    Target Date  04/04/19      PT LONG TERM GOAL #2   Title  Pt will report 2/10 pain in bil knees at worst with standing and ambulation during shift at work to improve ability to complete work related tasks.    Time  4    Period  Weeks    Status  On-going    Target Date  04/04/19      PT LONG TERM GOAL #3   Title  Pt will self report R knee buckling <3 times since starting physical therapy to indicate improvement in functional strength and improve ambulation tolerance.    Time  4    Period  Weeks    Status  On-going    Target Date  04/04/19      PT LONG TERM GOAL #4   Title  Pt will be able to stoop, bend, squat to floor to retrieve objects with 2/10 pain in bil knees at worst to allow her to perform household chores.    Time  4    Period  Weeks    Status  On-going    Target Date  04/04/19            Plan - 03/18/19 0858    Clinical Impression Statement  Patient tolerated session well but did note some increased RT knee pain with progression to step down on 6 in box. Patient educated on pacing/ grading of activity to progress eccentric RT quad strength to decrease pain and improve function with ambulating stairs. Added banded sidesteeping today, patient educated on proper form and function of exercise.    Personal Factors and Comorbidities  Comorbidity 1    Comorbidities  fibromyalgia    Examination-Activity Limitations  Locomotion Level;Stairs     Examination-Participation Restrictions  Community Activity    Stability/Clinical Decision Making  Stable/Uncomplicated    Rehab Potential  Good    PT Frequency  2x / week    PT Duration  4 weeks    PT Treatment/Interventions  ADLs/Self Care Home Management;Aquatic Therapy;Biofeedback;Cryotherapy;Electrical Stimulation;Moist Heat;Ultrasound;DME Instruction;Gait training;Stair training;Functional mobility training;Therapeutic activities;Therapeutic exercise;Balance training;Neuromuscular re-education;Patient/family education;Orthotic Fit/Training;Manual techniques;Passive range of motion;Dry needling;Taping;Joint Manipulations    PT Next Visit Plan  Progress eccentric RT quad strength as able.    PT Home Exercise Plan  Eval: bridges, quad sets, standing heel raise; 03/11/19: heel slides    Consulted and Agree with Plan of Care  Patient       Patient will benefit from skilled therapeutic intervention in order to improve the following deficits and impairments:  Decreased activity tolerance, Decreased range of motion, Decreased strength, Difficulty walking, Impaired perceived functional ability, Pain  Visit Diagnosis: Chronic pain of right knee  Chronic pain of left knee     Problem List Patient Active Problem List   Diagnosis Date Noted  . Encounter for gynecological examination with Papanicolaou smear of cervix 02/14/2019  . Vaginal dryness 02/01/2017  . Seasonal and perennial allergic rhinitis 12/26/2016  . Moderate persistent asthma without complication 123456  . Elevated BP 06/10/2015  . Post-operative state 10/22/2013  . Endometriosis of pelvis 09/16/2013  . Cervical os stenosis 08/06/2013  . Menorrhagia 08/06/2013  . Screening for colorectal cancer 01/14/2013  . Pre-op testing 12/25/2012  . Fibromyalgia 12/16/2012  . Irregular bleeding 12/05/2012  . Hypothyroid 12/05/2012  . Constipation 12/05/2012   9:01 AM, 03/18/19 Josue Hector PT DPT  Physical Therapist with  [facility]  [phone] [place] [address] [place], [place], [address] Phone: [phone]   Fax:  [phone]  Name: KEMYIA KUCZEK MRN: [id number] Date of Birth: 11/03/1965

## 2019-03-20 ENCOUNTER — Encounter (HOSPITAL_COMMUNITY): Payer: Self-pay | Admitting: Physical Therapy

## 2019-03-20 ENCOUNTER — Ambulatory Visit (HOSPITAL_COMMUNITY): Payer: BC Managed Care – PPO | Admitting: Physical Therapy

## 2019-03-20 ENCOUNTER — Other Ambulatory Visit: Payer: Self-pay

## 2019-03-20 DIAGNOSIS — M25561 Pain in right knee: Secondary | ICD-10-CM | POA: Diagnosis not present

## 2019-03-20 DIAGNOSIS — G8929 Other chronic pain: Secondary | ICD-10-CM | POA: Diagnosis not present

## 2019-03-20 DIAGNOSIS — M25562 Pain in left knee: Secondary | ICD-10-CM | POA: Diagnosis not present

## 2019-03-20 NOTE — Therapy (Signed)
[place], [place], [address] Phone: [phone]   Fax:  [phone]  Physical Therapy Treatment  Patient Details  Name: Valerie Henderson MRN: [id number] Date of Birth: [birth year] Referring Provider (PT): Gaynelle Arabian, MD   Encounter Date: 03/20/2019  PT End of Session - 03/20/19 0821    Visit Number  4    Number of Visits  8    Date for PT Re-Evaluation  04/04/19    Authorization Type  BCBS    Authorization Time Period  03/07/19 to 04/04/19   30 visit limit PT/OT combined, 0 used   PT Start Time  0817    PT Stop Time  0856    PT Time Calculation (min)  39 min    Activity Tolerance  Patient tolerated treatment well    Behavior During Therapy  [facility] for tasks assessed/performed       Past Medical History:  Diagnosis Date  . Anemia   . Asthma   . Complication of anesthesia   . Constipation 12/05/2012  . Elevated BP 06/10/2015  . Endometriosis   . Fibromyalgia 12/16/2012  . GERD (gastroesophageal reflux disease)   . Hypothyroid 12/05/2012  . Hypothyroidism   . Insomnia   . Irregular bleeding 12/05/2012   Endometrial  ablation 9/14  . PONV (postoperative nausea and vomiting)     Past Surgical History:  Procedure Laterality Date  . CHOLECYSTECTOMY    . DILITATION & CURRETTAGE/HYSTROSCOPY WITH THERMACHOICE ABLATION N/A 01/14/2013   Procedure: DILATATION & CURETTAGE/HYSTEROSCOPY WITH THERMACHOICE ABLATION;  Surgeon: Jonnie Kind, MD;  Location: [facility];  Service: Gynecology;  Laterality: N/A;  8 ml D5W total in, 8 ml D5W total out; 86-88 degrees Celcius; 8 minutes and 39 seconds total time  . LAPAROSCOPIC BILATERAL SALPINGECTOMY Bilateral 01/14/2013   Procedure: LAPAROSCOPIC BILATERAL SALPINGECTOMY;  Surgeon: Jonnie Kind, MD;  Location: [facility];  Service: Gynecology;  Laterality: Bilateral;  . OOPHORECTOMY Bilateral 09/16/2013   Procedure: OOPHORECTOMY;  Surgeon: Jonnie Kind, MD;  Location: [facility];  Service:  Gynecology;  Laterality: Bilateral;  . SUPRACERVICAL ABDOMINAL HYSTERECTOMY N/A 09/16/2013   Procedure: HYSTERECTOMY SUPRACERVICAL ABDOMINAL;  Surgeon: Jonnie Kind, MD;  Location: [facility];  Service: Gynecology;  Laterality: N/A;    There were no vitals filed for this visit.  Subjective Assessment - 03/20/19 0820    Subjective  Patient states "theres a little bit of pain getting started" this morning, but reports pain is minimal currently at 1/10   Limitations  Walking;Other (comment)   steps   How long can you sit comfortably?  no issues, after rising from static sitting    How long can you stand comfortably?  no issues    How long can you walk comfortably?  no issues    Diagnostic tests  R knee MRI - mild cartialge irrgularity in medial and patellofemoral compartments, prepatellar edema (bursitis)    Patient Stated Goals  "he just thinks if I can strengthen it some then it can help"    Currently in Pain?  Yes    Pain Score  1     Pain Location  Knee    Pain Orientation  Right;Anterior    Pain Descriptors / Indicators  Aching    Pain Onset  More than a month ago                       [facility] Adult PT Treatment/Exercise - 03/20/19  0001      Knee/Hip Exercises: Stretches   Knee: Self-Stretch to increase Flexion  Both;5 reps;10 seconds    Knee: Self-Stretch Limitations  on 12 inch step    Gastroc Stretch  Both;3 reps;30 seconds    Gastroc Stretch Limitations  slant board      Knee/Hip Exercises: Machines for Teacher, English as a foreign language  walkouts 40# x 10      Knee/Hip Exercises: Standing   Heel Raises  Both;20 reps    Hip Abduction  Both;20 reps    Abduction Limitations  red band    Hip Extension  Both;20 reps    Extension Limitations  red band    Forward Step Up  Both;15 reps;Step Height: 6"    Step Down  Both;Step Height: 6";15 reps    Functional Squat  20 reps    Functional Squat Limitations  with red band     SLS  3 x 15" RLE    SLS with Vectors  3 way  vectors    Other Standing Knee Exercises  Tandem stance; 3 x 30" each, on foam     Other Standing Knee Exercises  Sidestepping; red band; 15' 3 xRT               PT Short Term Goals - 03/11/19 1106      PT SHORT TERM GOAL #1   Title  Pt will be consistent with HEP to improve strength and reduce pain with functional mobility.    Time  2    Period  Weeks    Status  On-going    Target Date  03/21/19        PT Long Term Goals - 03/11/19 1106      PT LONG TERM GOAL #1   Title  Pt will perform stair ascent/descent with single handrail, reciprocal pattern, and good motor control to demo improved functional strength.    Time  4    Period  Weeks    Status  On-going    Target Date  04/04/19      PT LONG TERM GOAL #2   Title  Pt will report 2/10 pain in bil knees at worst with standing and ambulation during shift at work to improve ability to complete work related tasks.    Time  4    Period  Weeks    Status  On-going    Target Date  04/04/19      PT LONG TERM GOAL #3   Title  Pt will self report R knee buckling <3 times since starting physical therapy to indicate improvement in functional strength and improve ambulation tolerance.    Time  4    Period  Weeks    Status  On-going    Target Date  04/04/19      PT LONG TERM GOAL #4   Title  Pt will be able to stoop, bend, squat to floor to retrieve objects with 2/10 pain in bil knees at worst to allow her to perform household chores.    Time  4    Period  Weeks    Status  On-going    Target Date  04/04/19            Plan - 03/20/19 0858    Clinical Impression Statement  Patient tolerated session well today. Patient noted slight instabilty of RT knee during step ups on 6 inch box. Patient was able to perform eccentric step downs with less RT knee  discomfort. Patient educated on proper form and funciton of added machine walkouts. Patient required verbal cueing for proper foot placement and mechanics with functional  squatting.    Personal Factors and Comorbidities  Comorbidity 1    Comorbidities  fibromyalgia    Examination-Activity Limitations  Locomotion Level;Stairs    Examination-Participation Restrictions  Community Activity    Stability/Clinical Decision Making  Stable/Uncomplicated    Rehab Potential  Good    PT Frequency  2x / week    PT Duration  4 weeks    PT Treatment/Interventions  ADLs/Self Care Home Management;Aquatic Therapy;Biofeedback;Cryotherapy;Electrical Stimulation;Moist Heat;Ultrasound;DME Instruction;Gait training;Stair training;Functional mobility training;Therapeutic activities;Therapeutic exercise;Balance training;Neuromuscular re-education;Patient/family education;Orthotic Fit/Training;Manual techniques;Passive range of motion;Dry needling;Taping;Joint Manipulations    PT Next Visit Plan  Add tandem with palloff press next visit. Increase machine walkout weight.    PT Home Exercise Plan  Eval: bridges, quad sets, standing heel raise; 03/11/19: heel slides    Consulted and Agree with Plan of Care  Patient       Patient will benefit from skilled therapeutic intervention in order to improve the following deficits and impairments:  Decreased activity tolerance, Decreased range of motion, Decreased strength, Difficulty walking, Impaired perceived functional ability, Pain  Visit Diagnosis: Chronic pain of right knee  Chronic pain of left knee     Problem List Patient Active Problem List   Diagnosis Date Noted  . Encounter for gynecological examination with Papanicolaou smear of cervix 02/14/2019  . Vaginal dryness 02/01/2017  . Seasonal and perennial allergic rhinitis 12/26/2016  . Moderate persistent asthma without complication 123456  . Elevated BP 06/10/2015  . Post-operative state 10/22/2013  . Endometriosis of pelvis 09/16/2013  . Cervical os stenosis 08/06/2013  . Menorrhagia 08/06/2013  . Screening for colorectal cancer 01/14/2013  . Pre-op testing  12/25/2012  . Fibromyalgia 12/16/2012  . Irregular bleeding 12/05/2012  . Hypothyroid 12/05/2012  . Constipation 12/05/2012    9:01 AM, 03/20/19 Josue Hector PT DPT  Physical Therapist with [facility]  [phone] [place] [address] [place], [place], [address] Phone: [phone]   Fax:  [phone]  Name: Valerie Henderson MRN: [id number] Date of Birth: [birth year]

## 2019-03-24 ENCOUNTER — Telehealth (HOSPITAL_COMMUNITY): Payer: Self-pay

## 2019-03-24 NOTE — Telephone Encounter (Signed)
pt called to cancel her appt due to she has to work

## 2019-03-25 ENCOUNTER — Encounter (HOSPITAL_COMMUNITY): Payer: BC Managed Care – PPO

## 2019-03-25 ENCOUNTER — Telehealth (HOSPITAL_COMMUNITY): Payer: Self-pay

## 2019-03-25 NOTE — Telephone Encounter (Signed)
pt called to cancel via the phone tree

## 2019-03-26 ENCOUNTER — Encounter (HOSPITAL_COMMUNITY): Payer: BC Managed Care – PPO

## 2019-03-31 ENCOUNTER — Encounter (HOSPITAL_COMMUNITY): Payer: Self-pay | Admitting: Physical Therapy

## 2019-03-31 ENCOUNTER — Other Ambulatory Visit: Payer: Self-pay

## 2019-03-31 ENCOUNTER — Ambulatory Visit (HOSPITAL_COMMUNITY): Payer: BC Managed Care – PPO | Admitting: Physical Therapy

## 2019-03-31 DIAGNOSIS — M25561 Pain in right knee: Secondary | ICD-10-CM | POA: Diagnosis not present

## 2019-03-31 DIAGNOSIS — M25562 Pain in left knee: Secondary | ICD-10-CM | POA: Diagnosis not present

## 2019-03-31 DIAGNOSIS — G8929 Other chronic pain: Secondary | ICD-10-CM | POA: Diagnosis not present

## 2019-03-31 NOTE — Therapy (Signed)
[place], [place], [address] Phone: [phone]   Fax:  [phone]  Physical Therapy Treatment  Patient Details  Name: Valerie Henderson MRN: [id number] Date of Birth: 12/08/1965 Referring Provider (PT): Gaynelle Arabian, MD   Encounter Date: 03/31/2019  PT End of Session - 03/31/19 [id number]    Visit Number  5    Number of Visits  8    Date for PT Re-Evaluation  04/04/19    Authorization Type  BCBS    Authorization Time Period  03/07/19 to 04/04/19   30 visit limit PT/OT combined, 0 used   PT Start Time  0820    PT Stop Time  0859    PT Time Calculation (min)  39 min    Activity Tolerance  Patient tolerated treatment well    Behavior During Therapy  [facility] for tasks assessed/performed       Past Medical History:  Diagnosis Date  . Anemia   . Asthma   . Complication of anesthesia   . Constipation 12/05/2012  . Elevated BP 06/10/2015  . Endometriosis   . Fibromyalgia 12/16/2012  . GERD (gastroesophageal reflux disease)   . Hypothyroid 12/05/2012  . Hypothyroidism   . Insomnia   . Irregular bleeding 12/05/2012   Endometrial  ablation 9/14  . PONV (postoperative nausea and vomiting)     Past Surgical History:  Procedure Laterality Date  . CHOLECYSTECTOMY    . DILITATION & CURRETTAGE/HYSTROSCOPY WITH THERMACHOICE ABLATION N/A 01/14/2013   Procedure: DILATATION & CURETTAGE/HYSTEROSCOPY WITH THERMACHOICE ABLATION;  Surgeon: Jonnie Kind, MD;  Location: [facility];  Service: Gynecology;  Laterality: N/A;  8 ml D5W total in, 8 ml D5W total out; 86-88 degrees Celcius; 8 minutes and 39 seconds total time  . LAPAROSCOPIC BILATERAL SALPINGECTOMY Bilateral 01/14/2013   Procedure: LAPAROSCOPIC BILATERAL SALPINGECTOMY;  Surgeon: Jonnie Kind, MD;  Location: [facility];  Service: Gynecology;  Laterality: Bilateral;  . OOPHORECTOMY Bilateral 09/16/2013   Procedure: OOPHORECTOMY;  Surgeon: Jonnie Kind, MD;  Location: [facility];  Service:  Gynecology;  Laterality: Bilateral;  . SUPRACERVICAL ABDOMINAL HYSTERECTOMY N/A 09/16/2013   Procedure: HYSTERECTOMY SUPRACERVICAL ABDOMINAL;  Surgeon: Jonnie Kind, MD;  Location: [facility];  Service: Gynecology;  Laterality: N/A;    There were no vitals filed for this visit.  Subjective Assessment - 03/31/19 [id number]    Subjective  Patient says her knees are sore this morning because she went camping this weekend and had to walk up and down stairs to her camper several times. Patient denies pain currently but says it was a 5 or 6 when walking stairs.    Limitations  Walking;Other (comment)   steps   How long can you sit comfortably?  no issues, after rising from static sitting    How long can you stand comfortably?  no issues    How long can you walk comfortably?  no issues    Diagnostic tests  R knee MRI - mild cartialge irrgularity in medial and patellofemoral compartments, prepatellar edema (bursitis)    Patient Stated Goals  "he just thinks if I can strengthen it some then it can help"    Currently in Pain?  No/denies    Pain Onset  More than a month ago                       [facility] Adult PT Treatment/Exercise - 03/31/19 0001      Knee/Hip Exercises:  Stretches   Knee: Self-Stretch to increase Flexion  Both;5 reps;10 seconds    Knee: Self-Stretch Limitations  on 12 inch step    Gastroc Stretch  Both;3 reps;30 seconds    Gastroc Stretch Limitations  slant board      Knee/Hip Exercises: Machines for Strengthening   Cybex Knee Extension  20# 2 x 15; BLE    Cybex Leg Press  50# 2 x15; BLE    Other Machine  walkouts 50# x 10      Knee/Hip Exercises: Standing   Heel Raises  Both;20 reps    Hip Abduction  Both;20 reps    Abduction Limitations  red band    Hip Extension  Both;20 reps    Extension Limitations  red band    Forward Step Up  Both;15 reps;Step Height: 6"    Step Down  Both;Step Height: 6";15 reps    Functional Squat  20 reps    Functional Squat Limitations   with red band     SLS  3 x 15" BLE    SLS with Vectors  3 way vectors    Other Standing Knee Exercises  Tandem stance on foam with palloff pres; red band 20 x each way      Other Standing Knee Exercises  Sidestepping; red band; 15' 3 xRT               PT Short Term Goals - 03/11/19 1106      PT SHORT TERM GOAL #1   Title  Pt will be consistent with HEP to improve strength and reduce pain with functional mobility.    Time  2    Period  Weeks    Status  On-going    Target Date  03/21/19        PT Long Term Goals - 03/11/19 1106      PT LONG TERM GOAL #1   Title  Pt will perform stair ascent/descent with single handrail, reciprocal pattern, and good motor control to demo improved functional strength.    Time  4    Period  Weeks    Status  On-going    Target Date  04/04/19      PT LONG TERM GOAL #2   Title  Pt will report 2/10 pain in bil knees at worst with standing and ambulation during shift at work to improve ability to complete work related tasks.    Time  4    Period  Weeks    Status  On-going    Target Date  04/04/19      PT LONG TERM GOAL #3   Title  Pt will self report R knee buckling <3 times since starting physical therapy to indicate improvement in functional strength and improve ambulation tolerance.    Time  4    Period  Weeks    Status  On-going    Target Date  04/04/19      PT LONG TERM GOAL #4   Title  Pt will be able to stoop, bend, squat to floor to retrieve objects with 2/10 pain in bil knees at worst to allow her to perform household chores.    Time  4    Period  Weeks    Status  On-going    Target Date  04/04/19            Plan - 03/31/19 0905    Clinical Impression Statement  Patient did note mild increased pain in RT knee during  forward step ups, but activity was graded to accommodate patient and reps were lowered to 10 and 5. Patient able to perform remaining ther ex with no increased complaint of knee pain. Patient was able to  progress weight on machine walk outs, and added knee strengthening leg press and machine knee extension. Patient educated on proper form and function and required verbal cueing for lowering weight in slow controlled manner.    Personal Factors and Comorbidities  Comorbidity 1    Comorbidities  fibromyalgia    Examination-Activity Limitations  Locomotion Level;Stairs    Examination-Participation Restrictions  Community Activity    Stability/Clinical Decision Making  Stable/Uncomplicated    Rehab Potential  Good    PT Frequency  2x / week    PT Duration  4 weeks    PT Treatment/Interventions  ADLs/Self Care Home Management;Aquatic Therapy;Biofeedback;Cryotherapy;Electrical Stimulation;Moist Heat;Ultrasound;DME Instruction;Gait training;Stair training;Functional mobility training;Therapeutic activities;Therapeutic exercise;Balance training;Neuromuscular re-education;Patient/family education;Orthotic Fit/Training;Manual techniques;Passive range of motion;Dry needling;Taping;Joint Manipulations    PT Next Visit Plan  Continue to progress BLE strength and knee stabilization as able. Add foam to SLS next visit.    PT Home Exercise Plan  Eval: bridges, quad sets, standing heel raise; 03/11/19: heel slides    Consulted and Agree with Plan of Care  Patient       Patient will benefit from skilled therapeutic intervention in order to improve the following deficits and impairments:  Decreased activity tolerance, Decreased range of motion, Decreased strength, Difficulty walking, Impaired perceived functional ability, Pain  Visit Diagnosis: Chronic pain of right knee  Chronic pain of left knee     Problem List Patient Active Problem List   Diagnosis Date Noted  . Encounter for gynecological examination with Papanicolaou smear of cervix 02/14/2019  . Vaginal dryness 02/01/2017  . Seasonal and perennial allergic rhinitis 12/26/2016  . Moderate persistent asthma without complication 123456  .  Elevated BP 06/10/2015  . Post-operative state 10/22/2013  . Endometriosis of pelvis 09/16/2013  . Cervical os stenosis 08/06/2013  . Menorrhagia 08/06/2013  . Screening for colorectal cancer 01/14/2013  . Pre-op testing 12/25/2012  . Fibromyalgia 12/16/2012  . Irregular bleeding 12/05/2012  . Hypothyroid 12/05/2012  . Constipation 12/05/2012   9:09 AM, 03/31/19 Josue Hector PT DPT  Physical Therapist with [facility]  [phone] [place] [address] [place], [place], [address] Phone: [phone]   Fax:  [phone]  Name: BRAYLEA ALTERMAN MRN: [id number] Date of Birth: 1965-09-29

## 2019-04-02 ENCOUNTER — Ambulatory Visit (HOSPITAL_COMMUNITY): Payer: BC Managed Care – PPO | Attending: Orthopedic Surgery | Admitting: Physical Therapy

## 2019-04-02 ENCOUNTER — Encounter (HOSPITAL_COMMUNITY): Payer: Self-pay | Admitting: Physical Therapy

## 2019-04-02 ENCOUNTER — Other Ambulatory Visit: Payer: Self-pay

## 2019-04-02 DIAGNOSIS — M25561 Pain in right knee: Secondary | ICD-10-CM | POA: Insufficient documentation

## 2019-04-02 DIAGNOSIS — M25562 Pain in left knee: Secondary | ICD-10-CM | POA: Diagnosis not present

## 2019-04-02 DIAGNOSIS — G8929 Other chronic pain: Secondary | ICD-10-CM | POA: Diagnosis not present

## 2019-04-02 NOTE — Patient Instructions (Signed)
Access Code: [email or web address]  URL: [email or web address]  Date: 04/02/2019  Prepared by: Josue Hector   Exercises Gastroc Stretch with Foot at [place] 3 reps - 1 sets - 30 hold - 1x daily - 7x weekly Standing Knee Flexion Stretch on Step - 5 reps - 1 sets - 10 hold - 1x daily - 7x weekly Standing Heel Raise - 10 reps - 2 sets - 1x daily - 7x weekly Standing Hip Abduction Kicks - 10 reps - 2 sets - 1x daily - 4x weekly Standing Hip Extension Kicks - 10 reps - 2 sets - 1x daily - 4x weekly Step Up - 10 reps - 2 sets - 1x daily - 4x weekly Backward Step Up - 10 reps - 2 sets - 1x daily - 4x weekly Tandem Stance on Foam Pad with Eyes Open - 3 reps - 1 sets - 30 hold - 1x daily - 4x weekly

## 2019-04-02 NOTE — Therapy (Signed)
[place], [place], [address] Phone: [phone]   Fax:  [phone]  Physical Therapy Treatment/ Discharge Summary  Patient Details  Name: Valerie Henderson MRN: [id number] Date of Birth: 09-13-1965 Referring Provider (PT): Gaynelle Arabian, MD   Encounter Date: 04/02/2019   PHYSICAL THERAPY DISCHARGE SUMMARY  Visits from Start of Care: 6  Current functional level related to goals / functional outcomes: See below   Remaining deficits: See below   Education / Equipment: See assessment  Plan: Patient agrees to discharge.  Patient goals were met. Patient is being discharged due to meeting the stated rehab goals.  ?????       PT End of Session - 04/02/19 0828    Visit Number  6    Number of Visits  8    Date for PT Re-Evaluation  04/04/19    Authorization Type  BCBS    Authorization Time Period  03/07/19 to 04/04/19   30 visit limit PT/OT combined, 0 used   PT Start Time  0816    PT Stop Time  0858    PT Time Calculation (min)  42 min    Activity Tolerance  Patient tolerated treatment well    Behavior During Therapy  [facility] for tasks assessed/performed       Past Medical History:  Diagnosis Date  . Anemia   . Asthma   . Complication of anesthesia   . Constipation 12/05/2012  . Elevated BP 06/10/2015  . Endometriosis   . Fibromyalgia 12/16/2012  . GERD (gastroesophageal reflux disease)   . Hypothyroid 12/05/2012  . Hypothyroidism   . Insomnia   . Irregular bleeding 12/05/2012   Endometrial  ablation 9/14  . PONV (postoperative nausea and vomiting)     Past Surgical History:  Procedure Laterality Date  . CHOLECYSTECTOMY    . DILITATION & CURRETTAGE/HYSTROSCOPY WITH THERMACHOICE ABLATION N/A 01/14/2013   Procedure: DILATATION & CURETTAGE/HYSTEROSCOPY WITH THERMACHOICE ABLATION;  Surgeon: Jonnie Kind, MD;  Location: [facility];  Service: Gynecology;  Laterality: N/A;  8 ml D5W total in, 8 ml D5W total out; 86-88  degrees Celcius; 8 minutes and 39 seconds total time  . LAPAROSCOPIC BILATERAL SALPINGECTOMY Bilateral 01/14/2013   Procedure: LAPAROSCOPIC BILATERAL SALPINGECTOMY;  Surgeon: Jonnie Kind, MD;  Location: [facility];  Service: Gynecology;  Laterality: Bilateral;  . OOPHORECTOMY Bilateral 09/16/2013   Procedure: OOPHORECTOMY;  Surgeon: Jonnie Kind, MD;  Location: [facility];  Service: Gynecology;  Laterality: Bilateral;  . SUPRACERVICAL ABDOMINAL HYSTERECTOMY N/A 09/16/2013   Procedure: HYSTERECTOMY SUPRACERVICAL ABDOMINAL;  Surgeon: Jonnie Kind, MD;  Location: [facility];  Service: Gynecology;  Laterality: N/A;    There were no vitals filed for this visit.  Subjective Assessment - 04/02/19 3664    Subjective  Patient states she feels she is doing better and has notably less knee pain, especially at side of RT knee. Patient says she does still have some pain in front of RT knee, mostly with stairs and bending. Patient syas she feels about 80% improvement since starting therapy and feels she is ready for discharge to transition to home exercise.    Limitations  Walking;Other (comment)   steps   How long can you sit comfortably?  no issues, after rising from static sitting    How long can you stand comfortably?  no issues    How long can you walk comfortably?  no issues    Diagnostic tests  R knee MRI -  mild cartialge irrgularity in medial and patellofemoral compartments, prepatellar edema (bursitis)    Patient Stated Goals  "he just thinks if I can strengthen it some then it can help"    Currently in Pain?  No/denies    Pain Onset  More than a month ago         [facility] PT Assessment - 04/02/19 0001      Assessment   Medical Diagnosis  bil knee pain    Referring Provider (PT)  Gaynelle Arabian, MD    Prior Therapy  none      Precautions   Precautions  None      Restrictions   Weight Bearing Restrictions  No      Balance Screen   Has the patient fallen in the past 6 months  No    Has the  patient had a decrease in activity level because of a fear of falling?   No    Is the patient reluctant to leave their home because of a fear of falling?   No      Prior Function   Level of Independence  Independent    Vocation  Full time employment    Vocation Requirements  hair stylist, standing/walking    Leisure  camping      Cognition   Overall Cognitive Status  Within Functional Limits for tasks assessed      Observation/Other Assessments   Focus on Therapeutic Outcomes (FOTO)   15% limited   was 25%     Sensation   Light Touch  Appears Intact      AROM   Right Knee Extension  0    Right Knee Flexion  135    Left Knee Extension  0    Left Knee Flexion  135      Strength   Right Hip Flexion  5/5   was 4+   Right Hip Extension  5/5   was 4+   Right Hip ABduction  5/5    Left Hip Flexion  5/5   was 4+   Left Hip Extension  5/5   was 4+   Left Hip ABduction  5/5    Right Knee Flexion  5/5   was 4+   Right Knee Extension  5/5    Left Knee Flexion  5/5   was 4+   Left Knee Extension  5/5    Right Ankle Dorsiflexion  5/5    Left Ankle Dorsiflexion  5/5                   [facility] Adult PT Treatment/Exercise - 04/02/19 0001      Knee/Hip Exercises: Stretches   Knee: Self-Stretch to increase Flexion  Both;5 reps;10 seconds    Knee: Self-Stretch Limitations  on 12 inch step    Gastroc Stretch  Both;3 reps;30 seconds    Gastroc Stretch Limitations  slant board      Knee/Hip Exercises: Standing   Heel Raises  Both;20 reps    Hip Abduction  Both;20 reps    Abduction Limitations  red band    Hip Extension  Both;20 reps    Extension Limitations  red band    Forward Step Up  Both;Step Height: 6";10 reps    Step Down  Both;Step Height: 6";10 reps    Other Standing Knee Exercises  Tandem stance on foam 2 x 30 sec each leg back             PT Education -  04/02/19 0845    Education Details  Patient educated on reassessment findings, DC status and HEP     Person(s) Educated  Patient    Methods  Explanation;Handout    Comprehension  Verbalized understanding       PT Short Term Goals - 04/02/19 0824      PT SHORT TERM GOAL #1   Title  Pt will be consistent with HEP to improve strength and reduce pain with functional mobility.    Time  2    Period  Weeks    Status  Achieved    Target Date  03/21/19        PT Long Term Goals - 04/02/19 6712      PT LONG TERM GOAL #1   Title  Pt will perform stair ascent/descent with single handrail, reciprocal pattern, and good motor control to demo improved functional strength.    Time  4    Period  Weeks    Status  Achieved      PT LONG TERM GOAL #2   Title  Pt will report 2/10 pain in bil knees at worst with standing and ambulation during shift at work to improve ability to complete work related tasks.    Time  4    Period  Weeks    Status  Achieved      PT LONG TERM GOAL #3   Title  Pt will self report R knee buckling <3 times since starting physical therapy to indicate improvement in functional strength and improve ambulation tolerance.    Time  4    Period  Weeks    Status  Achieved      PT LONG TERM GOAL #4   Title  Pt will be able to stoop, bend, squat to floor to retrieve objects with 2/10 pain in bil knees at worst to allow her to perform household chores.    Time  4    Period  Weeks    Status  Achieved            Plan - 04/02/19 0902    Clinical Impression Statement  Patient tolerated session well today and has met all LTGs. Patient being DC today with all goals met. Addressed all patient questions and educated patient on and issued updated HEP handout. Patient instructed to follow up with therapy services with any further questions or concerns.    Personal Factors and Comorbidities  Comorbidity 1    Comorbidities  fibromyalgia    Examination-Activity Limitations  Locomotion Level;Stairs    Examination-Participation Restrictions  Community Activity     Stability/Clinical Decision Making  Stable/Uncomplicated    Rehab Potential  Good    PT Treatment/Interventions  ADLs/Self Care Home Management;Aquatic Therapy;Biofeedback;Cryotherapy;Electrical Stimulation;Moist Heat;Ultrasound;DME Instruction;Gait training;Stair training;Functional mobility training;Therapeutic activities;Therapeutic exercise;Balance training;Neuromuscular re-education;Patient/family education;Orthotic Fit/Training;Manual techniques;Passive range of motion;Dry needling;Taping;Joint Manipulations    PT Next Visit Plan  DC    PT Home Exercise Plan  Eval: bridges, quad sets, standing heel raise; 03/11/19: heel slides    Consulted and Agree with Plan of Care  Patient       Patient will benefit from skilled therapeutic intervention in order to improve the following deficits and impairments:  Decreased activity tolerance, Decreased range of motion, Decreased strength, Difficulty walking, Impaired perceived functional ability, Pain  Visit Diagnosis: Chronic pain of right knee  Chronic pain of left knee     Problem List Patient Active Problem List   Diagnosis Date Noted  . Encounter for gynecological  examination with Papanicolaou smear of cervix 02/14/2019  . Vaginal dryness 02/01/2017  . Seasonal and perennial allergic rhinitis 12/26/2016  . Moderate persistent asthma without complication 54/88/4573  . Elevated BP 06/10/2015  . Post-operative state 10/22/2013  . Endometriosis of pelvis 09/16/2013  . Cervical os stenosis 08/06/2013  . Menorrhagia 08/06/2013  . Screening for colorectal cancer 01/14/2013  . Pre-op testing 12/25/2012  . Fibromyalgia 12/16/2012  . Irregular bleeding 12/05/2012  . Hypothyroid 12/05/2012  . Constipation 12/05/2012   9:06 AM, 04/02/19 Josue Hector PT DPT  Physical Therapist with [facility]  [phone] [place] [address] [place], [place], [address] Phone:  [phone]   Fax:  [phone]  Name: Valerie Henderson MRN: [id number] Date of Birth: [birth year]

## 2019-06-09 DIAGNOSIS — Z1329 Encounter for screening for other suspected endocrine disorder: Secondary | ICD-10-CM | POA: Diagnosis not present

## 2019-06-09 DIAGNOSIS — Z0189 Encounter for other specified special examinations: Secondary | ICD-10-CM | POA: Diagnosis not present

## 2019-06-16 DIAGNOSIS — G4700 Insomnia, unspecified: Secondary | ICD-10-CM | POA: Diagnosis not present

## 2019-06-16 DIAGNOSIS — G43009 Migraine without aura, not intractable, without status migrainosus: Secondary | ICD-10-CM | POA: Diagnosis not present

## 2019-06-16 DIAGNOSIS — I10 Essential (primary) hypertension: Secondary | ICD-10-CM | POA: Diagnosis not present

## 2019-06-16 DIAGNOSIS — R7301 Impaired fasting glucose: Secondary | ICD-10-CM | POA: Diagnosis not present

## 2019-06-16 DIAGNOSIS — E039 Hypothyroidism, unspecified: Secondary | ICD-10-CM | POA: Diagnosis not present

## 2019-06-16 DIAGNOSIS — Z0001 Encounter for general adult medical examination with abnormal findings: Secondary | ICD-10-CM | POA: Diagnosis not present

## 2019-06-16 DIAGNOSIS — M62838 Other muscle spasm: Secondary | ICD-10-CM | POA: Diagnosis not present

## 2019-07-02 ENCOUNTER — Ambulatory Visit: Payer: BC Managed Care – PPO | Admitting: Allergy & Immunology

## 2019-07-15 DIAGNOSIS — Z23 Encounter for immunization: Secondary | ICD-10-CM | POA: Diagnosis not present

## 2019-07-18 ENCOUNTER — Encounter: Payer: Self-pay | Admitting: Allergy & Immunology

## 2019-07-18 ENCOUNTER — Other Ambulatory Visit: Payer: Self-pay

## 2019-07-18 ENCOUNTER — Ambulatory Visit (INDEPENDENT_AMBULATORY_CARE_PROVIDER_SITE_OTHER): Payer: BC Managed Care – PPO | Admitting: Allergy & Immunology

## 2019-07-18 VITALS — BP 134/66 | HR 69 | Temp 98.3°F | Resp 18 | Ht 59.5 in | Wt 205.2 lb

## 2019-07-18 DIAGNOSIS — J4540 Moderate persistent asthma, uncomplicated: Secondary | ICD-10-CM

## 2019-07-18 DIAGNOSIS — J3089 Other allergic rhinitis: Secondary | ICD-10-CM

## 2019-07-18 DIAGNOSIS — J302 Other seasonal allergic rhinitis: Secondary | ICD-10-CM

## 2019-07-18 NOTE — Patient Instructions (Addendum)
1. Moderate persistent asthma, uncomplicated - Lung function looked slightly lower today. - We are going to get some blood work to see if you would qualify for an injectable medication for your asthma. - We are also adding on Spiriva 2 puffs once daily to see if this helps with your breathing.  - We will call you with the lab results once we get them in.  - Daily controller medication(s): Symbicort 160/4.33mcg two puffs in the morning and one puff at night + Spiriva two puffs once daily - Prior to physical activity: ProAir 2 puffs 10-15 minutes before physical activity. - Rescue medications: ProAir 4 puffs every 4-6 hours as needed - Changes during respiratory infections or worsening symptoms: Increase Symbicort 160/4.5 to 2 puffs twice daily for ONE TO TWO WEEKS. - Asthma control goals:  * Full participation in all desired activities (may need albuterol before activity) * Albuterol use two time or less a week on average (not counting use with activity) * Cough interfering with sleep two time or less a month * Oral steroids no more than once a year * No hospitalizations  2. Perennial allergic rhinitis (grass, trees, mold, dust mite, and cat) - Continue with cetirizine 10mg  daily. - Continue with Flonase to one spray per nostril twice daily as needed.  - Continue with Astelin one spray per nostril twice daily as needed.  3. Return in about 6 weeks (around 08/29/2019). This can be an in-person, a virtual Webex or a telephone follow up visit.   Please inform us of any Emergency Department visits, hospitalizations, or changes in symptoms. Call us before going to the ED for breathing or allergy symptoms since we might be able to fit you in for a sick visit. Feel free to contact us anytime with any questions, problems, or concerns.  It was a pleasure to see you again today!  Websites that have reliable patient information: 1. American Academy of Asthma, Allergy, and Immunology: www.aaaai.org 2.  Food Allergy Research and Education (FARE): foodallergy.org 3. Mothers of Asthmatics: http://www.asthmacommunitynetwork.org 4. American College of Allergy, Asthma, and Immunology: www.acaai.org   COVID-19 Vaccine Information can be found at: ShippingScam.co.uk For questions related to vaccine distribution or appointments, please email vaccine@[email or web address] or call 8143667035.     "Like" [place] on Facebook and Instagram for our latest updates!        Make sure you are registered to vote! If you have moved or changed any of your contact information, you will need to get this updated before voting!  In some cases, you MAY be able to register to vote online: [email or web address]

## 2019-07-18 NOTE — Progress Notes (Signed)
FOLLOW UP  Date of Service/Encounter:  07/18/19   Assessment:   Moderate persistent asthma, uncomplicated - with worsening control since the last visit  Seasonal and perennial allergic rhinitis(grass, trees, mold, dust mite, and cat)  Hypothyroidism  Osteopenia- incidental finding on X-ray    Asthma Reportables: Severity:moderate persistent Risk:low Control:well controlled    Plan/Recommendations:   1. Moderate persistent asthma, uncomplicated - Lung function looked slightly lower today. - We are going to get some blood work to see if you would qualify for an injectable medication for your asthma. - We are also adding on Spiriva 2 puffs once daily to see if this helps with your breathing.  - We will call you with the lab results once we get them in.  - Daily controller medication(s): Symbicort 160/4.1mcg two puffs in the morning and two puffs at night + Spiriva two puffs once daily - Prior to physical activity: ProAir 2 puffs 10-15 minutes before physical activity. - Rescue medications: ProAir 4 puffs every 4-6 hours as needed - Changes during respiratory infections or worsening symptoms: Increase Symbicort 160/4.5 to 2 puffs twice daily for ONE TO TWO WEEKS. - Asthma control goals:  * Full participation in all desired activities (may need albuterol before activity) * Albuterol use two time or less a week on average (not counting use with activity) * Cough interfering with sleep two time or less a month * Oral steroids no more than once a year * No hospitalizations  2. Perennial allergic rhinitis (grass, trees, mold, dust mite, and cat) - Continue with cetirizine 10mg  daily. - Continue with Flonase to one spray per nostril twice daily as needed.  - Continue with Astelin one spray per nostril twice daily as needed.  3. Return in about 6 weeks (around 08/29/2019). This can be an in-person, a virtual Webex or a telephone follow up visit.   Subjective:    Valerie Henderson is a 54 y.o. female presenting today for follow up of  Chief Complaint  Patient presents with  . Follow-up  . Asthma    Valerie Henderson has a history of the following: Patient Active Problem List   Diagnosis Date Noted  . Encounter for gynecological examination with Papanicolaou smear of cervix 02/14/2019  . Vaginal dryness 02/01/2017  . Seasonal and perennial allergic rhinitis 12/26/2016  . Moderate persistent asthma without complication [id number]  . Elevated BP 06/10/2015  . Post-operative state 10/22/2013  . Endometriosis of pelvis 09/16/2013  . Cervical os stenosis 08/06/2013  . Menorrhagia 08/06/2013  . Screening for colorectal cancer 01/14/2013  . Pre-op testing 12/25/2012  . Fibromyalgia 12/16/2012  . Irregular bleeding 12/05/2012  . Hypothyroid 12/05/2012  . Constipation 12/05/2012    History obtained from: chart review and patient.  Valerie Henderson is a 54 y.o. female presenting for a follow up visit.  She was last seen in September 2020.  At that time, her lung function looks stable.  We continued with Symbicort 160/4.5 mcg 2 puffs once daily, increasing to 2 puffs twice daily for 1 to 2 weeks.  For her rhinitis, would continue with cetirizine, Flonase, and Astelin.  Since the last visit, she has mostly done well.  She remains working on a limited schedule due to the coronavirus pandemic.  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  She is fine with the pace however.  Asthma/Respiratory Symptom History: She remains on the Symbicort, but she was doing one puff twice daily. But  she has been using two puffs in the morning and one at night.  Tomesha's asthma has mostly been well controlled, at least she thinks so, although she does mention that her albuterol use has been increased as of late. However, she has required no Emergency Department or Urgent Care visits for her asthma. She has required zero courses of systemic steroids for asthma exacerbations  since the last visit. ACT score today is 22, indicating excellent asthma symptom control. She is not a smoker at all. She was when she was in high school.   Allergic Rhinitis Symptom History: She remains on cetirizine as well as the nasal sprays.  She tends to use the Astelin on an as-needed basis.  She has not needed antibiotics or prednisone since last visit.  She did have her first COVID-19 vaccine. She received the Moderna. She is scheduled for her second injection.   Otherwise, there have been no changes to her past medical history, surgical history, family history, or social history.    Review of Systems  Constitutional: Negative.  Negative for fever, malaise/fatigue and weight loss.  HENT: Negative.  Negative for congestion, ear discharge and ear pain.   Eyes: Negative for pain, discharge and redness.  Respiratory: Negative for cough, sputum production, shortness of breath and wheezing.   Cardiovascular: Negative.  Negative for chest pain and palpitations.  Gastrointestinal: Negative for abdominal pain, constipation, diarrhea, heartburn, nausea and vomiting.  Skin: Negative.  Negative for itching and rash.  Neurological: Negative for dizziness and headaches.  Endo/Heme/Allergies: Negative for environmental allergies. Does not bruise/bleed easily.       Objective:   Blood pressure 134/66, pulse 69, temperature 98.3 F (36.8 C), temperature source Temporal, resp. rate 18, height 4' 11.5" (1.511 m), weight 205 lb 3.2 oz (93.1 kg), last menstrual period 08/15/2013, SpO2 98 %. Body mass index is 40.75 kg/m.   Physical Exam:  Physical Exam  Constitutional: She appears well-developed.  Very pleasant female.  Cooperative with the exam.  HENT:  Head: Normocephalic and atraumatic.  Right Ear: Tympanic membrane, external ear and ear canal normal.  Left Ear: Tympanic membrane, external ear and ear canal normal.  Nose: No mucosal edema, rhinorrhea, nasal deformity or septal  deviation. No epistaxis. Right sinus exhibits no maxillary sinus tenderness and no frontal sinus tenderness. Left sinus exhibits no maxillary sinus tenderness and no frontal sinus tenderness.  Mouth/Throat: Uvula is midline and oropharynx is clear and moist. Mucous membranes are not pale and not dry.  Eyes: Pupils are equal, round, and reactive to light. Conjunctivae and EOM are normal. Right eye exhibits no chemosis and no discharge. Left eye exhibits no chemosis and no discharge. Right conjunctiva is not injected. Left conjunctiva is not injected.  Cardiovascular: Normal rate, regular rhythm and normal heart sounds.  Respiratory: Effort normal and breath sounds normal. No accessory muscle usage. No tachypnea. No respiratory distress. She has no wheezes. She has no rhonchi. She has no rales. She exhibits no tenderness.  Moving air well in all lung fields.  Lymphadenopathy:    She has no cervical adenopathy.  Neurological: She is alert.  Skin: No abrasion, no petechiae and no rash noted. Rash is not papular, not vesicular and not urticarial. No erythema. No pallor.  No eczematous or urticarial lesions noted.  Psychiatric: She has a normal mood and affect.     Diagnostic studies:    Spirometry: results abnormal (FEV1: 1.32/57%, FVC: 1.75/60%, FEV1/FVC: 75%).    Spirometry consistent with possible restrictive  disease.   Allergy Studies: none     Salvatore Marvel, MD  [facility]

## 2019-07-23 DIAGNOSIS — J4540 Moderate persistent asthma, uncomplicated: Secondary | ICD-10-CM | POA: Diagnosis not present

## 2019-07-26 LAB — CBC WITH DIFFERENTIAL/PLATELET
Basophils Absolute: 0.1 10*3/uL (ref 0.0–0.2)
Basos: 1 %
EOS (ABSOLUTE): 0.3 10*3/uL (ref 0.0–0.4)
Eos: 3 %
Hematocrit: 38.1 % (ref 34.0–46.6)
Hemoglobin: 12.5 g/dL (ref 11.1–15.9)
Immature Grans (Abs): 0 10*3/uL (ref 0.0–0.1)
Immature Granulocytes: 0 %
Lymphocytes Absolute: 3 10*3/uL (ref 0.7–3.1)
Lymphs: 36 %
MCH: 27.3 pg (ref 26.6–33.0)
MCHC: 32.8 g/dL (ref 31.5–35.7)
MCV: 83 fL (ref 79–97)
Monocytes Absolute: 0.6 10*3/uL (ref 0.1–0.9)
Monocytes: 8 %
Neutrophils Absolute: 4.4 10*3/uL (ref 1.4–7.0)
Neutrophils: 52 %
Platelets: 246 10*3/uL (ref 150–450)
RBC: 4.58 x10E6/uL (ref 3.77–5.28)
RDW: 14.5 % (ref 11.7–15.4)
WBC: 8.4 10*3/uL (ref 3.4–10.8)

## 2019-07-26 LAB — IGE: IgE (Immunoglobulin E), Serum: 26 IU/mL (ref 6–495)

## 2019-07-28 ENCOUNTER — Encounter: Payer: Self-pay | Admitting: Allergy & Immunology

## 2019-07-31 ENCOUNTER — Telehealth: Payer: Self-pay | Admitting: *Deleted

## 2019-07-31 NOTE — Telephone Encounter (Signed)
Called and discussed both Nucala and Berna Bue with patient and she wants to try [place]. Explained process with patient approval, copay card and submit to pharmacy.  Also once patient receives Rx at home to reach out to clinic to make appt for start and instruction on self admin

## 2019-07-31 NOTE — Telephone Encounter (Signed)
-----   Message from Valentina Shaggy, MD sent at 07/28/2019  7:00 AM EDT ----- MyChart message sent.  She would qualify for an injectable medicine with the elevated absolute eosinophil count. Routing to Mikias Lanz to reach out to the patient.  Salvatore Marvel, MD [facility]

## 2019-08-08 DIAGNOSIS — E669 Obesity, unspecified: Secondary | ICD-10-CM | POA: Diagnosis not present

## 2019-08-08 DIAGNOSIS — E039 Hypothyroidism, unspecified: Secondary | ICD-10-CM | POA: Diagnosis not present

## 2019-08-08 DIAGNOSIS — B001 Herpesviral vesicular dermatitis: Secondary | ICD-10-CM | POA: Diagnosis not present

## 2019-08-08 DIAGNOSIS — R0781 Pleurodynia: Secondary | ICD-10-CM | POA: Diagnosis not present

## 2019-08-08 DIAGNOSIS — G43009 Migraine without aura, not intractable, without status migrainosus: Secondary | ICD-10-CM | POA: Diagnosis not present

## 2019-08-08 DIAGNOSIS — Z136 Encounter for screening for cardiovascular disorders: Secondary | ICD-10-CM | POA: Diagnosis not present

## 2019-08-13 DIAGNOSIS — E039 Hypothyroidism, unspecified: Secondary | ICD-10-CM | POA: Diagnosis not present

## 2019-08-13 DIAGNOSIS — E669 Obesity, unspecified: Secondary | ICD-10-CM | POA: Diagnosis not present

## 2019-08-13 DIAGNOSIS — B001 Herpesviral vesicular dermatitis: Secondary | ICD-10-CM | POA: Diagnosis not present

## 2019-08-13 DIAGNOSIS — G43009 Migraine without aura, not intractable, without status migrainosus: Secondary | ICD-10-CM | POA: Diagnosis not present

## 2019-08-15 DIAGNOSIS — Z23 Encounter for immunization: Secondary | ICD-10-CM | POA: Diagnosis not present

## 2019-08-20 ENCOUNTER — Ambulatory Visit: Payer: Self-pay

## 2019-08-20 ENCOUNTER — Other Ambulatory Visit: Payer: Self-pay

## 2019-08-20 NOTE — Progress Notes (Unsigned)
Immunotherapy   Patient Details  Name: Valerie Henderson MRN: [id number] Date of Birth: 1965-11-12  08/20/2019  Christophe Louis started Fasenra injections today for severe asthma. Patient will self administer at home. Patient was shown how to administer injection and self administered correctly. Patient waited 30 minutes with no problems Frequency: Every 4 weeks X 3 doses then every 8 weeks.  Epi-Pen: Yes Consent signed and patient instructions given. Patient was given a card with her next due date to self administer at home. Patient verbalized understanding.   Herbie Drape 08/20/2019, 3:20 PM

## 2019-08-27 ENCOUNTER — Ambulatory Visit: Payer: BC Managed Care – PPO | Admitting: Allergy & Immunology

## 2019-08-27 ENCOUNTER — Encounter: Payer: Self-pay | Admitting: Allergy & Immunology

## 2019-08-27 ENCOUNTER — Ambulatory Visit (INDEPENDENT_AMBULATORY_CARE_PROVIDER_SITE_OTHER): Payer: BC Managed Care – PPO | Admitting: Allergy & Immunology

## 2019-08-27 ENCOUNTER — Other Ambulatory Visit: Payer: Self-pay

## 2019-08-27 VITALS — BP 106/76 | HR 78 | Temp 98.6°F | Resp 18

## 2019-08-27 DIAGNOSIS — J302 Other seasonal allergic rhinitis: Secondary | ICD-10-CM | POA: Diagnosis not present

## 2019-08-27 DIAGNOSIS — J4540 Moderate persistent asthma, uncomplicated: Secondary | ICD-10-CM

## 2019-08-27 DIAGNOSIS — J3089 Other allergic rhinitis: Secondary | ICD-10-CM

## 2019-08-27 DIAGNOSIS — M8580 Other specified disorders of bone density and structure, unspecified site: Secondary | ICD-10-CM

## 2019-08-27 MED ORDER — TRIAMCINOLONE ACETONIDE 55 MCG/ACT NA AERO: 1.0000 | INHALATION_SPRAY | Freq: Every day | NASAL | 3 refills | Status: DC | PRN

## 2019-08-27 NOTE — Progress Notes (Signed)
FOLLOW UP  Date of Service/Encounter:  08/27/19   Assessment:   Moderate persistent asthma, uncomplicated - with worsening control since the last visit  Seasonal and perennial allergic rhinitis(grass, trees, mold, dust mite, and cat)  Hypothyroidism  Osteopenia- incidental finding on X-ray   Asthma Reportables: Severity:moderate persistent Risk:low Control:well controlled   Plan/Recommendations:   1. Moderate persistent asthma, uncomplicated - Lung function looked slightly better today. - We are going to change you to Trelegy one puff once daily (contains three medications to help with asthma). - Samples provided and copay provided.  - Daily controller medication(s): Trelegy 2100mcg one puff once daily + Fasenra every month (spacing out to every 8 weeks after three doses) - Prior to physical activity: ProAir 2 puffs 10-15 minutes before physical activity. - Rescue medications: ProAir 4 puffs every 4-6 hours as needed - Asthma control goals:  * Full participation in all desired activities (may need albuterol before activity) * Albuterol use two time or less a week on average (not counting use with activity) * Cough interfering with sleep two time or less a month * Oral steroids no more than once a year * No hospitalizations  2. Perennial allergic rhinitis (grass, trees, mold, dust mite, and cat) - Continue with cetirizine 10mg  daily. - Stop the Flonase and change to Nasacort one spray per nostril daily as needed.  - Continue with Astelin one spray per nostril twice daily as needed.  3. Return in about 6 months (around 02/26/2020). This can be an in-person, a virtual Webex or a telephone follow up visit.  Subjective:   Valerie Henderson is a 54 y.o. female presenting today for follow up of  Chief Complaint  Patient presents with  . No complaints    Valerie Henderson has a history of the following: Patient Active Problem List   Diagnosis Date Noted  .  Encounter for gynecological examination with Papanicolaou smear of cervix 02/14/2019  . Vaginal dryness 02/01/2017  . Seasonal and perennial allergic rhinitis 12/26/2016  . Moderate persistent asthma without complication 123456  . Elevated BP 06/10/2015  . Post-operative state 10/22/2013  . Endometriosis of pelvis 09/16/2013  . Cervical os stenosis 08/06/2013  . Menorrhagia 08/06/2013  . Screening for colorectal cancer 01/14/2013  . Pre-op testing 12/25/2012  . Fibromyalgia 12/16/2012  . Irregular bleeding 12/05/2012  . Hypothyroid 12/05/2012  . Constipation 12/05/2012    History obtained from: chart review and patient.  Valerie Henderson is a 54 y.o. female presenting for a follow up visit. At the last visit, her lung testing was slightly lower.  We did obtain some blood work to see if she will qualify for one of our Biologics.  We also added on Spiriva 2 puffs once daily to see if that helped.  Her absolute eosinophil count was 300.  Tammy did discuss starting Berna Bue, but it seems that this was never started.  She was continued on Symbicort 160/4.5 mcg 2 puffs in the morning and 2 puffs at night.  Since last visit, she has done very well.  She did start the Berna Bue and is given it herself at home.  Asthma/Respiratory Symptom History: She remains on the Symbicort 2 puffs in the morning and 2 puffs at night.  She is not great about taking it.  She does use the Spiriva around 4 times per week.  She feels that this combination works fairly well.  She would like a simpler regimen and is open to new ideas.  She has  not needed any prednisone and has not been to the emergency room in quite some time.  Allergic Rhinitis Symptom History: She remains on the cetirizine daily as well as the nose sprays.  She is using the nose sprays on more of an as-needed basis.  She has not been on antibiotics at all since last visit.  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  She has  been fully vaccinated for COVID-19.  Otherwise, there have been no changes to her past medical history, surgical history, family history, or social history.    Review of Systems  Constitutional: Negative.  Negative for chills, fever, malaise/fatigue and weight loss.  HENT: Negative.  Negative for congestion, ear discharge and ear pain.   Eyes: Negative for pain, discharge and redness.  Respiratory: Negative for cough, sputum production, shortness of breath and wheezing.   Cardiovascular: Negative.  Negative for chest pain and palpitations.  Gastrointestinal: Negative for abdominal pain, constipation, diarrhea, heartburn, nausea and vomiting.  Skin: Negative.  Negative for itching and rash.  Neurological: Negative for dizziness and headaches.  Endo/Heme/Allergies: Negative for environmental allergies. Does not bruise/bleed easily.       Objective:   Blood pressure 106/76, pulse 78, temperature 98.6 F (37 C), temperature source Temporal, resp. rate 18, last menstrual period 08/15/2013, SpO2 96 %. There is no height or weight on file to calculate BMI.   Physical Exam:  Physical Exam  Constitutional: She appears well-developed.  Very pleasant talkative female.  Cooperative with the exam.  HENT:  Head: Normocephalic and atraumatic.  Right Ear: Tympanic membrane, external ear and ear canal normal.  Left Ear: Tympanic membrane, external ear and ear canal normal.  Nose: Mucosal edema and rhinorrhea present. No nasal deformity or septal deviation. No epistaxis. Right sinus exhibits no maxillary sinus tenderness and no frontal sinus tenderness. Left sinus exhibits no maxillary sinus tenderness and no frontal sinus tenderness.  Mouth/Throat: Uvula is midline and oropharynx is clear and moist. Mucous membranes are not pale and not dry.  No cobblestoning.  Oropharynx appears normal.  Tonsils unremarkable.  Eyes: Pupils are equal, round, and reactive to light. Conjunctivae and EOM are  normal. Right eye exhibits no chemosis and no discharge. Left eye exhibits no chemosis and no discharge. Right conjunctiva is not injected. Left conjunctiva is not injected.  Cardiovascular: Normal rate, regular rhythm and normal heart sounds.  Respiratory: Effort normal and breath sounds normal. No accessory muscle usage. No tachypnea. No respiratory distress. She has no wheezes. She has no rhonchi. She has no rales. She exhibits no tenderness.  Moving air well in all lung fields.  Lymphadenopathy:    She has no cervical adenopathy.  Neurological: She is alert.  Skin: No abrasion, no petechiae and no rash noted. Rash is not papular, not vesicular and not urticarial. No erythema. No pallor.  Psychiatric: She has a normal mood and affect.     Diagnostic studies:    Spirometry: results abnormal (FEV1: 1.37/60%, FVC: 1.88/65%, FEV1/FVC: 73%).    Spirometry consistent with possible restrictive disease.  Overall, values are slightly better compared to the last visit.  Allergy Studies: none       Salvatore Marvel, MD  [facility]

## 2019-08-27 NOTE — Patient Instructions (Addendum)
1. Moderate persistent asthma, uncomplicated - Lung function looked slightly better today. - We are going to change you to Trelegy one puff once daily (contains three medications to help with asthma). - Samples provided and copay provided.  - Daily controller medication(s): Trelegy 268mcg one puff once daily + Fasenra every month (spacing out to every 8 weeks after three doses) - Prior to physical activity: ProAir 2 puffs 10-15 minutes before physical activity. - Rescue medications: ProAir 4 puffs every 4-6 hours as needed - Asthma control goals:  * Full participation in all desired activities (may need albuterol before activity) * Albuterol use two time or less a week on average (not counting use with activity) * Cough interfering with sleep two time or less a month * Oral steroids no more than once a year * No hospitalizations  2. Perennial allergic rhinitis (grass, trees, mold, dust mite, and cat) - Continue with cetirizine 10mg  daily. - Stop the Flonase and change to Nasacort one spray per nostril daily as needed.  - Continue with Astelin one spray per nostril twice daily as needed.  3. Return in about 6 months (around 02/26/2020). This can be an in-person, a virtual Webex or a telephone follow up visit.   Please inform us of any Emergency Department visits, hospitalizations, or changes in symptoms. Call us before going to the ED for breathing or allergy symptoms since we might be able to fit you in for a sick visit. Feel free to contact us anytime with any questions, problems, or concerns.  It was a pleasure to see you again today!  Websites that have reliable patient information: 1. American Academy of Asthma, Allergy, and Immunology: www.aaaai.org 2. Food Allergy Research and Education (FARE): foodallergy.org 3. Mothers of Asthmatics: http://www.asthmacommunitynetwork.org 4. American College of Allergy, Asthma, and Immunology: www.acaai.org   COVID-19 Vaccine Information can  be found at: ShippingScam.co.uk For questions related to vaccine distribution or appointments, please email [email or web address] or call [phone].     "Like" [place] on Facebook and Instagram for our latest updates!       HAPPY SPRING!  Make sure you are registered to vote! If you have moved or changed any of your contact information, you will need to get this updated before voting!  In some cases, you MAY be able to register to vote online: [email or web address]

## 2019-09-05 ENCOUNTER — Telehealth: Payer: Self-pay

## 2019-09-05 ENCOUNTER — Other Ambulatory Visit: Payer: Self-pay

## 2019-09-05 MED ORDER — TRELEGY ELLIPTA 200-62.5-25 MCG/INH IN AEPB: 1.0000 | INHALATION_SPRAY | Freq: Every day | RESPIRATORY_TRACT | 5 refills | Status: DC

## 2019-09-05 NOTE — Telephone Encounter (Signed)
Patient was given a sample of Trelegy and would like to move forward with a prescription.  [place]

## 2019-09-05 NOTE — Telephone Encounter (Signed)
Prescription for Trelegy was sent in to pharmacy and patient was informed.

## 2019-09-16 NOTE — Telephone Encounter (Signed)
Error

## 2019-12-13 IMAGING — CT CT ANGIO CHEST
2 of 6 series · 19 of 46 positions shown · IV contrast (Isovue)
Comparison: None.

CLINICAL DATA: Left anterior chest pain. Radiates to the back
across the shoulder for 2 weeks

EXAM:
CT ANGIOGRAPHY CHEST WITH CONTRAST
TECHNIQUE: Multidetector CT imaging of the chest was performed using the
standard protocol during bolus administration of intravenous
contrast. Multiplanar CT image reconstructions and MIPs were
obtained to evaluate the vascular anatomy.
CONTRAST:  100mL 2R30T2-BMN IOPAMIDOL (2R30T2-BMN) INJECTION 76%

[Series 5: thins · axial · 0.55mm/px · z∈[+1253,+1502]mm · 16 of 273 slices shown]
[im 12/273  lung]
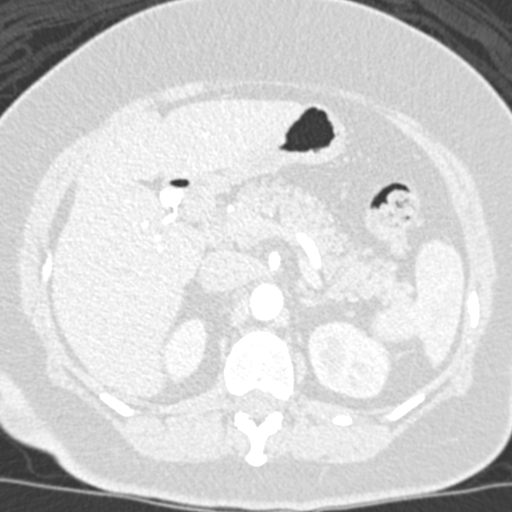
[im 36/273  soft-tissue]
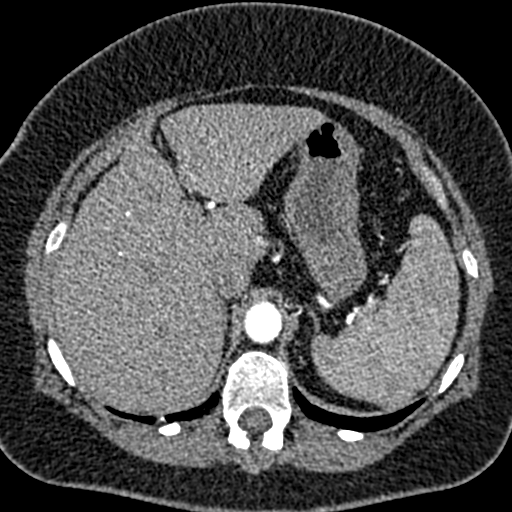
[im 48/273  lung]
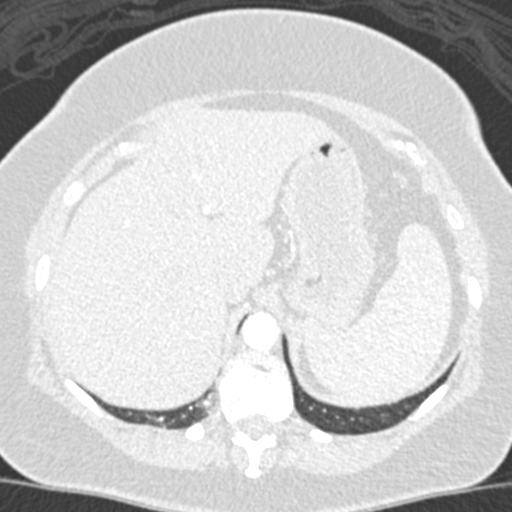
[im 60/273  soft-tissue]
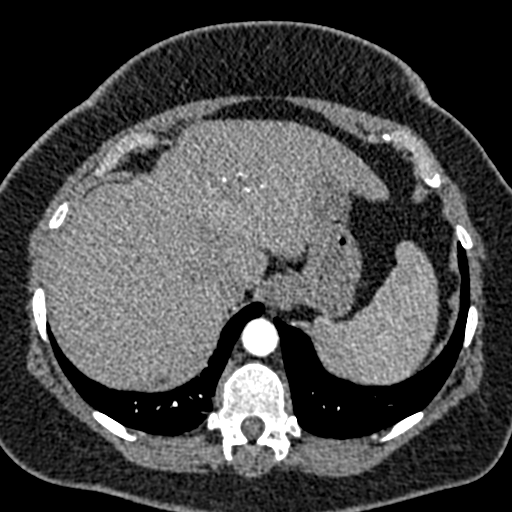
[im 83/273  lung]
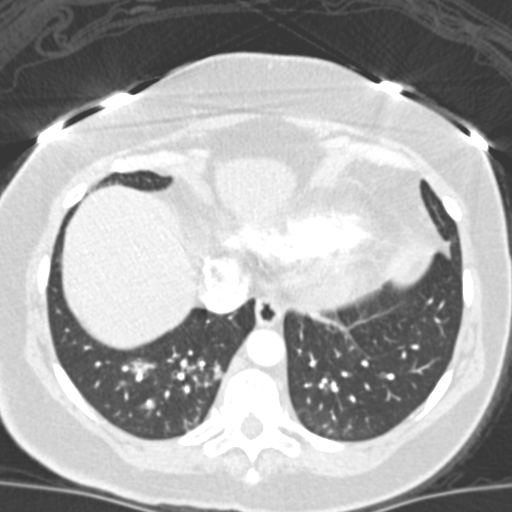
[im 95/273  soft-tissue]
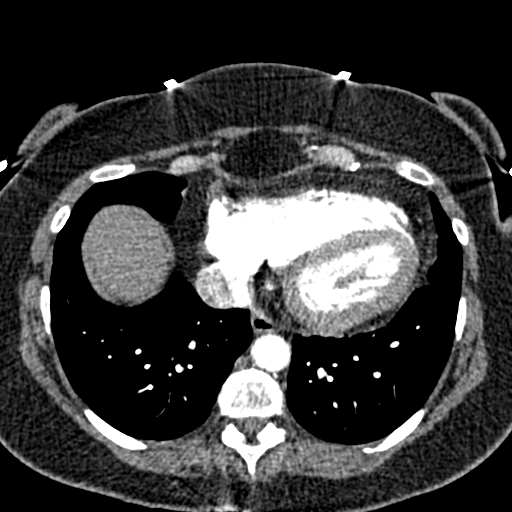
[im 107/273  lung]
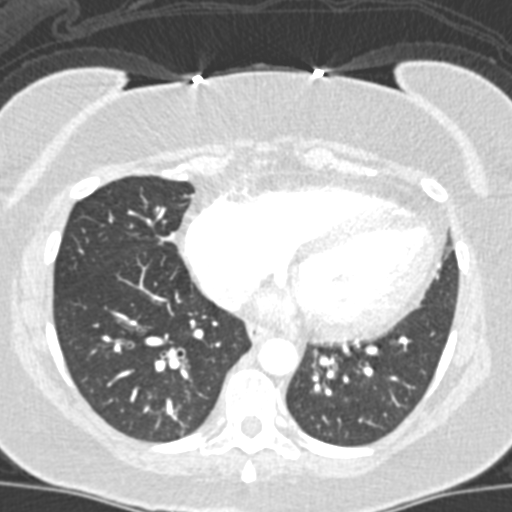
[im 131/273  soft-tissue]
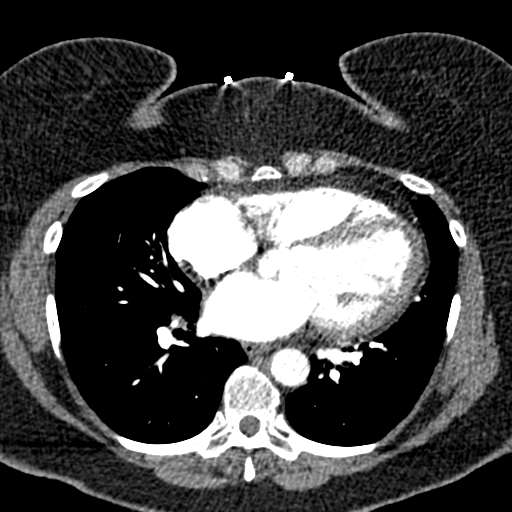
[im 142/273  lung]
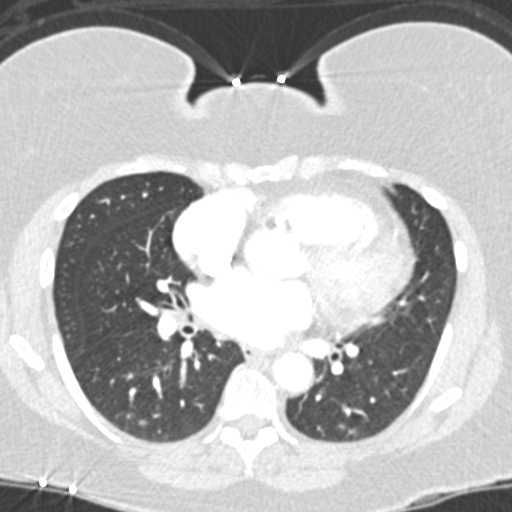
[im 166/273  soft-tissue]
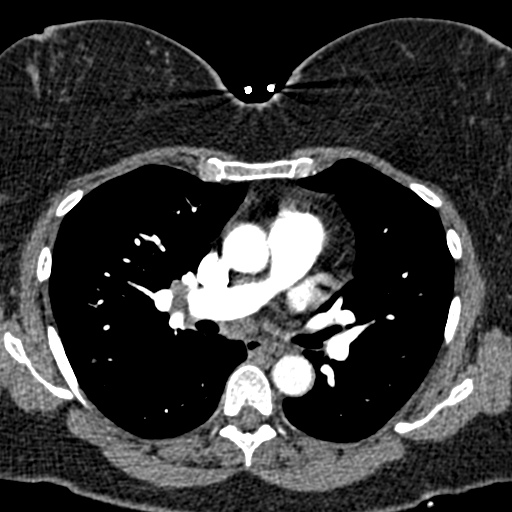
[im 178/273  lung]
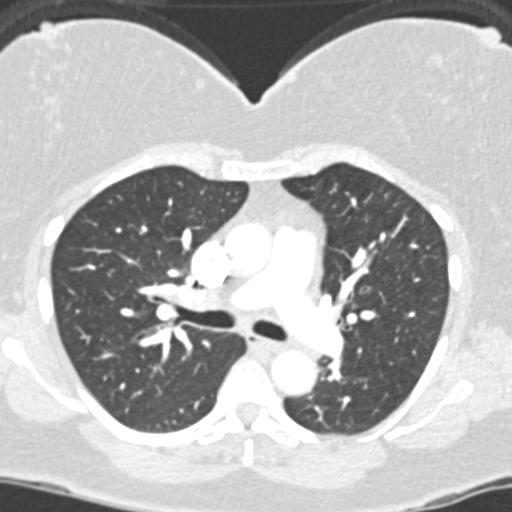
[im 190/273  soft-tissue]
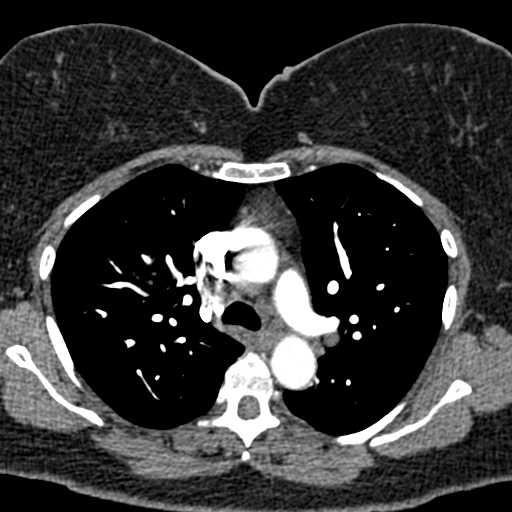
[im 213/273  lung]
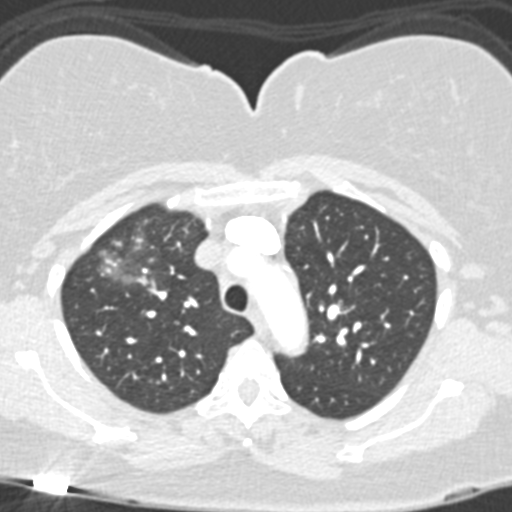
[im 225/273  soft-tissue]
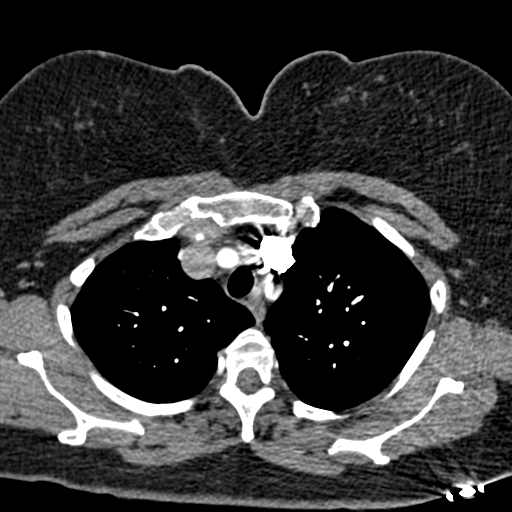
[im 237/273  lung]
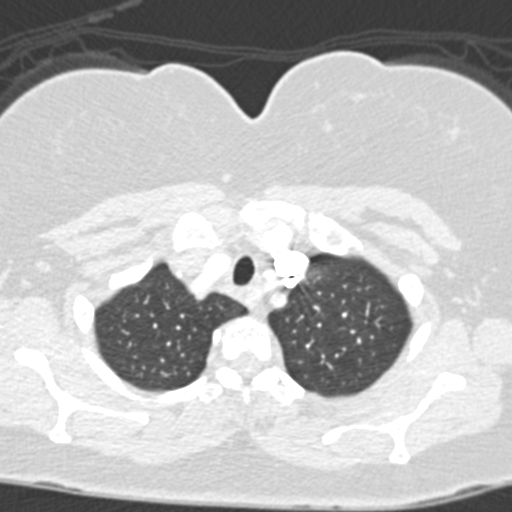
[im 261/273  soft-tissue]
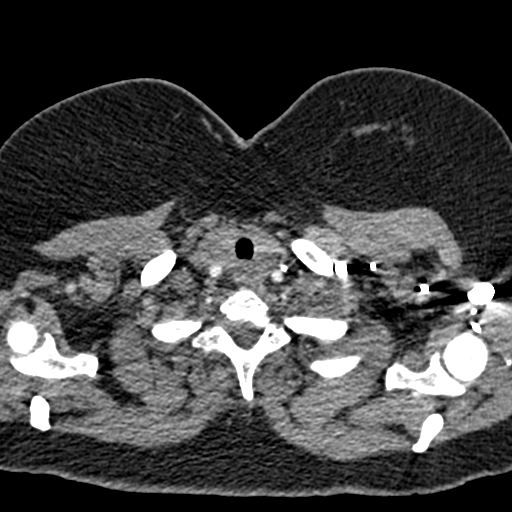

[Series 7: coronal mpr · coronal · 0.55mm/px · 3 of 131 slices shown]
[im 33/131  soft-tissue]
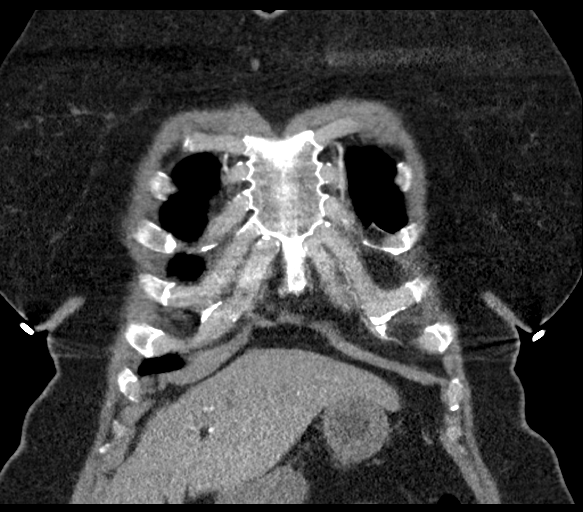
[im 66/131  soft-tissue]
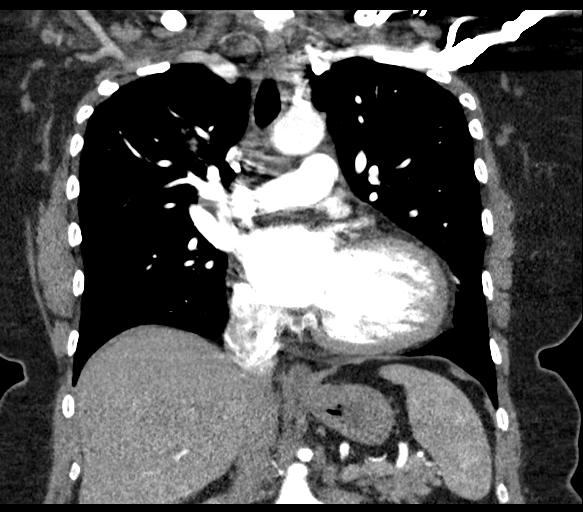
[im 98/131  soft-tissue]
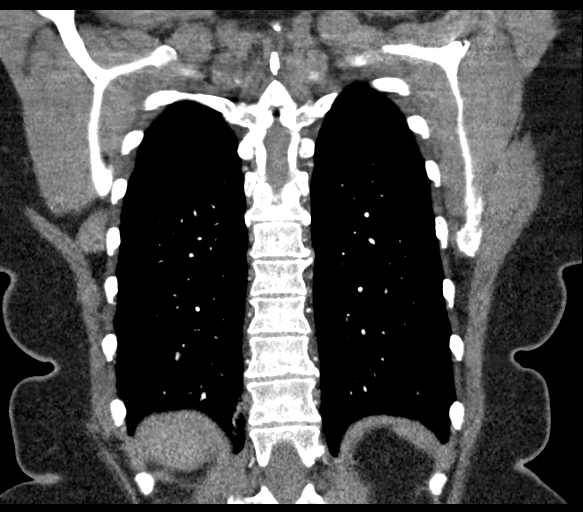

[19 of 46 positions shown; findings below may reference images not displayed]

FINDINGS: Cardiovascular: Satisfactory opacification of the pulmonary arteries
to the segmental level. No evidence of pulmonary embolism. Normal
heart size. No pericardial effusion. Thoracic aorta is normal in
caliber. No thoracic aortic dissection.

Mediastinum/Nodes: No enlarged mediastinal, hilar, or axillary lymph
nodes. Thyroid gland, trachea, and esophagus demonstrate no
significant findings.

Lungs/Pleura: Right upper lobe airspace disease most concerning for
pneumonia. Mild similar lower lobe airspace disease bilaterally
concerning for pneumonia. No pleural effusion or pneumothorax.

Upper Abdomen: No acute abnormality.

Musculoskeletal: No chest wall abnormality. No acute or significant
osseous findings.

Review of the MIP images confirms the above findings.
IMPRESSION: 1. No evidence of pulmonary embolus.
2. Right upper lobe airspace disease and to a much lesser extent
bilateral lower lobe airspace disease most concerning for multilobar
pneumonia.

## 2019-12-15 DIAGNOSIS — I10 Essential (primary) hypertension: Secondary | ICD-10-CM | POA: Diagnosis not present

## 2019-12-15 DIAGNOSIS — E669 Obesity, unspecified: Secondary | ICD-10-CM | POA: Diagnosis not present

## 2019-12-15 DIAGNOSIS — Z0189 Encounter for other specified special examinations: Secondary | ICD-10-CM | POA: Diagnosis not present

## 2019-12-15 DIAGNOSIS — E039 Hypothyroidism, unspecified: Secondary | ICD-10-CM | POA: Diagnosis not present

## 2019-12-15 DIAGNOSIS — G4700 Insomnia, unspecified: Secondary | ICD-10-CM | POA: Diagnosis not present

## 2019-12-15 DIAGNOSIS — R7301 Impaired fasting glucose: Secondary | ICD-10-CM | POA: Diagnosis not present

## 2019-12-15 DIAGNOSIS — Z0001 Encounter for general adult medical examination with abnormal findings: Secondary | ICD-10-CM | POA: Diagnosis not present

## 2019-12-22 DIAGNOSIS — Z0001 Encounter for general adult medical examination with abnormal findings: Secondary | ICD-10-CM | POA: Diagnosis not present

## 2019-12-29 DIAGNOSIS — J069 Acute upper respiratory infection, unspecified: Secondary | ICD-10-CM | POA: Diagnosis not present

## 2020-01-08 DIAGNOSIS — R0782 Intercostal pain: Secondary | ICD-10-CM | POA: Diagnosis not present

## 2020-01-08 DIAGNOSIS — J4540 Moderate persistent asthma, uncomplicated: Secondary | ICD-10-CM | POA: Diagnosis not present

## 2020-01-08 DIAGNOSIS — J069 Acute upper respiratory infection, unspecified: Secondary | ICD-10-CM | POA: Diagnosis not present

## 2020-01-12 ENCOUNTER — Ambulatory Visit (HOSPITAL_COMMUNITY)
Admission: RE | Admit: 2020-01-12 | Discharge: 2020-01-12 | Disposition: A | Discharge status: Home / Self Care | Payer: BC Managed Care – PPO | Source: Ambulatory Visit | Attending: Internal Medicine | Admitting: Internal Medicine

## 2020-01-12 ENCOUNTER — Other Ambulatory Visit: Payer: Self-pay

## 2020-01-12 ENCOUNTER — Other Ambulatory Visit (HOSPITAL_COMMUNITY): Payer: Self-pay | Admitting: Internal Medicine

## 2020-01-12 DIAGNOSIS — J069 Acute upper respiratory infection, unspecified: Secondary | ICD-10-CM | POA: Insufficient documentation

## 2020-01-12 DIAGNOSIS — J45909 Unspecified asthma, uncomplicated: Secondary | ICD-10-CM | POA: Diagnosis not present

## 2020-01-12 DIAGNOSIS — I10 Essential (primary) hypertension: Secondary | ICD-10-CM | POA: Diagnosis not present

## 2020-01-12 DIAGNOSIS — R079 Chest pain, unspecified: Secondary | ICD-10-CM | POA: Diagnosis not present

## 2020-01-20 ENCOUNTER — Telehealth: Payer: Self-pay

## 2020-01-20 NOTE — Telephone Encounter (Signed)
Patient called to make sure it is okay for her to go ahead and do her next Fasenra injection. Patient missed her injection on Monday because she has been on Prednisone & Antibiotics. She called her pcp and was told to call Dr Ernst Bowler to make sure it is okay to administer her next dose.  Please Advise.

## 2020-01-21 NOTE — Telephone Encounter (Signed)
Please update patient

## 2020-01-21 NOTE — Telephone Encounter (Signed)
Spoke with patient and informed her of Dr. Gillermina Hu recommendation. Patient verbalized understanding.

## 2020-01-21 NOTE — Telephone Encounter (Signed)
Yes it is ok to administer the [facility].  Salvatore Marvel, MD [facility]

## 2020-01-26 ENCOUNTER — Other Ambulatory Visit (HOSPITAL_COMMUNITY): Payer: Self-pay

## 2020-01-26 ENCOUNTER — Other Ambulatory Visit: Payer: BC Managed Care – PPO

## 2020-01-26 DIAGNOSIS — Z20822 Contact with and (suspected) exposure to covid-19: Secondary | ICD-10-CM | POA: Diagnosis not present

## 2020-01-27 ENCOUNTER — Encounter: Payer: Self-pay | Admitting: Oncology

## 2020-01-27 ENCOUNTER — Ambulatory Visit (HOSPITAL_COMMUNITY)
Admission: RE | Admit: 2020-01-27 | Discharge: 2020-01-27 | Disposition: A | Discharge status: Home / Self Care | Payer: BC Managed Care – PPO | Source: Ambulatory Visit | Attending: Pulmonary Disease | Admitting: Pulmonary Disease

## 2020-01-27 ENCOUNTER — Other Ambulatory Visit: Payer: Self-pay | Admitting: Oncology

## 2020-01-27 DIAGNOSIS — U071 COVID-19: Secondary | ICD-10-CM | POA: Diagnosis not present

## 2020-01-27 MED ORDER — EPINEPHRINE 0.3 MG/0.3ML IJ SOAJ: 0.3000 mg | Freq: Once | INTRAMUSCULAR | Status: DC | PRN

## 2020-01-27 MED ORDER — METHYLPREDNISOLONE SODIUM SUCC 125 MG IJ SOLR: 125.0000 mg | Freq: Once | INTRAMUSCULAR | Status: DC | PRN

## 2020-01-27 MED ORDER — SODIUM CHLORIDE 0.9 % IV SOLN
1200.0000 mg | Freq: Once | INTRAVENOUS | Status: AC
  Administered 2020-01-27: 1200 mg via INTRAVENOUS

## 2020-01-27 MED ORDER — SODIUM CHLORIDE 0.9 % IV SOLN: INTRAVENOUS | Status: DC | PRN

## 2020-01-27 MED ORDER — DIPHENHYDRAMINE HCL 50 MG/ML IJ SOLN: 50.0000 mg | Freq: Once | INTRAMUSCULAR | Status: DC | PRN

## 2020-01-27 MED ORDER — ALBUTEROL SULFATE HFA 108 (90 BASE) MCG/ACT IN AERS: 2.0000 | INHALATION_SPRAY | Freq: Once | RESPIRATORY_TRACT | Status: DC | PRN

## 2020-01-27 MED ORDER — FAMOTIDINE IN NACL 20-0.9 MG/50ML-% IV SOLN: 20.0000 mg | Freq: Once | INTRAVENOUS | Status: DC | PRN

## 2020-01-27 NOTE — Progress Notes (Signed)
  Diagnosis: COVID-19  Physician:dr wright  Procedure: [facility] Med: casirivimab\imdevimab infusion - Provided patient with casirivimab\imdevimab fact sheet for patients, parents and caregivers prior to infusion.  Complications: No immediate complications noted.  Discharge: Discharged home   [place] 01/27/2020

## 2020-01-27 NOTE — Progress Notes (Signed)
I connected by phone with Valerie Henderson to discuss the potential use of an new treatment for mild to moderate COVID-19 viral infection in non-hospitalized patients.   This patient is a age/sex that meets the FDA criteria for Emergency Use Authorization of casirivimab\imdevimab.  Has a (+) direct SARS-CoV-2 viral test result 1. Has mild or moderate COVID-19  2. Is ? 53 years of age and weighs ? 40 kg 3. Is NOT hospitalized due to COVID-19 4. Is NOT requiring oxygen therapy or requiring an increase in baseline oxygen flow rate due to COVID-19 5. Is within 10 days of symptom onset 6. Has at least one of the high risk factor(s) for progression to severe COVID-19 and/or hospitalization as defined in EUA. Specific high risk criteria : Past Medical History:  Diagnosis Date   Anemia    Asthma    Complication of anesthesia    Constipation 12/05/2012   Elevated BP 06/10/2015   Endometriosis    Fibromyalgia 12/16/2012   GERD (gastroesophageal reflux disease)    Hypothyroid 12/05/2012   Hypothyroidism    Insomnia    Irregular bleeding 12/05/2012   Endometrial  ablation 9/14   PONV (postoperative nausea and vomiting)   ?  ?    Symptom onset  01/22/20   I have spoken and communicated the following to the patient or parent/caregiver:   1. FDA has authorized the emergency use of casirivimab\imdevimab for the treatment of mild to moderate COVID-19 in adults and pediatric patients with positive results of direct SARS-CoV-2 viral testing who are 61 years of age and older weighing at least 40 kg, and who are at high risk for progressing to severe COVID-19 and/or hospitalization.   2. The significant known and potential risks and benefits of casirivimab\imdevimab, and the extent to which such potential risks and benefits are unknown.   3. Information on available alternative treatments and the risks and benefits of those alternatives, including clinical trials.   4. Patients treated with  casirivimab\imdevimab should continue to self-isolate and use infection control measures (e.g., wear mask, isolate, social distance, avoid sharing personal items, clean and disinfect high touch surfaces, and frequent handwashing) according to CDC guidelines.    5. The patient or parent/caregiver has the option to accept or refuse casirivimab\imdevimab .   After reviewing this information with the patient, The patient agreed to proceed with receiving casirivimab\imdevimab infusion and will be provided a copy of the Fact sheet prior to receiving the infusion.Rulon Abide, AGNP-C [phone] ([place])

## 2020-01-27 NOTE — Discharge Instructions (Signed)

## 2020-02-13 ENCOUNTER — Other Ambulatory Visit (HOSPITAL_COMMUNITY): Payer: Self-pay | Admitting: Internal Medicine

## 2020-02-13 DIAGNOSIS — Z1231 Encounter for screening mammogram for malignant neoplasm of breast: Secondary | ICD-10-CM

## 2020-02-23 ENCOUNTER — Ambulatory Visit (HOSPITAL_COMMUNITY)
Admission: RE | Admit: 2020-02-23 | Discharge: 2020-02-23 | Disposition: A | Discharge status: Home / Self Care | Payer: BC Managed Care – PPO | Source: Ambulatory Visit | Attending: Internal Medicine | Admitting: Internal Medicine

## 2020-02-23 ENCOUNTER — Other Ambulatory Visit: Payer: Self-pay

## 2020-02-23 DIAGNOSIS — Z1231 Encounter for screening mammogram for malignant neoplasm of breast: Secondary | ICD-10-CM | POA: Insufficient documentation

## 2020-02-25 ENCOUNTER — Other Ambulatory Visit: Payer: Self-pay

## 2020-02-25 ENCOUNTER — Ambulatory Visit: Payer: BC Managed Care – PPO | Admitting: Allergy & Immunology

## 2020-02-25 ENCOUNTER — Encounter: Payer: Self-pay | Admitting: Allergy & Immunology

## 2020-02-25 VITALS — BP 110/90 | HR 79 | Temp 99.3°F | Resp 18

## 2020-02-25 DIAGNOSIS — J302 Other seasonal allergic rhinitis: Secondary | ICD-10-CM

## 2020-02-25 DIAGNOSIS — M8580 Other specified disorders of bone density and structure, unspecified site: Secondary | ICD-10-CM | POA: Diagnosis not present

## 2020-02-25 DIAGNOSIS — J4540 Moderate persistent asthma, uncomplicated: Secondary | ICD-10-CM

## 2020-02-25 DIAGNOSIS — J3089 Other allergic rhinitis: Secondary | ICD-10-CM

## 2020-02-25 NOTE — Patient Instructions (Addendum)
1. Moderate persistent asthma, uncomplicated - Lung function looked very stable today.  - We are not going to make any changes today at all. - Daily controller medication(s): Trelegy 237mcg one puff once daily + Fasenra every 8 weeks after three doses - Prior to physical activity: ProAir 2 puffs 10-15 minutes before physical activity. - Rescue medications: ProAir 4 puffs every 4-6 hours as needed - Asthma control goals:  * Full participation in all desired activities (may need albuterol before activity) * Albuterol use two time or less a week on average (not counting use with activity) * Cough interfering with sleep two time or less a month * Oral steroids no more than once a year * No hospitalizations  2. Perennial allergic rhinitis (grass, trees, mold, dust mite, and cat) - Continue with cetirizine 10mg  daily. - Stop the Flonase and change to Nasacort one spray per nostril daily as needed.  - Continue with Astelin one spray per nostril twice daily as needed.  3. Return in about 6 months (around 08/25/2020).    Please inform us of any Emergency Department visits, hospitalizations, or changes in symptoms. Call us before going to the ED for breathing or allergy symptoms since we might be able to fit you in for a sick visit. Feel free to contact us anytime with any questions, problems, or concerns.  It was a pleasure to see you again today!  Websites that have reliable patient information: 1. American Academy of Asthma, Allergy, and Immunology: www.aaaai.org 2. Food Allergy Research and Education (FARE): foodallergy.org 3. Mothers of Asthmatics: http://www.asthmacommunitynetwork.org 4. American College of Allergy, Asthma, and Immunology: www.acaai.org   COVID-19 Vaccine Information can be found at: ShippingScam.co.uk For questions related to vaccine distribution or appointments, please email [email or web address] or call  [phone].     "Like" [place] on Facebook and Instagram for our latest updates!     HAPPY FALL!     Make sure you are registered to vote! If you have moved or changed any of your contact information, you will need to get this updated before voting!  In some cases, you MAY be able to register to vote online: [email or web address]

## 2020-02-25 NOTE — Progress Notes (Signed)
FOLLOW UP  Date of Service/Encounter:  02/25/20   Assessment:   Moderate persistent asthma, uncomplicated- with worsening control since the last visit  Seasonal and perennial allergic rhinitis(grass, trees, mold, dust mite, and cat)  Hypothyroidism  Osteopenia- incidental finding on X-ray  Plan/Recommendations:   1. Moderate persistent asthma, uncomplicated - Lung function looked very stable today.  - We are not going to make any changes today at all. - Daily controller medication(s): Trelegy 255mcg one puff once daily + Fasenra every 8 weeks after three doses - Prior to physical activity: ProAir 2 puffs 10-15 minutes before physical activity. - Rescue medications: ProAir 4 puffs every 4-6 hours as needed - Asthma control goals:  * Full participation in all desired activities (may need albuterol before activity) * Albuterol use two time or less a week on average (not counting use with activity) * Cough interfering with sleep two time or less a month * Oral steroids no more than once a year * No hospitalizations  2. Perennial allergic rhinitis (grass, trees, mold, dust mite, and cat) - Continue with cetirizine 10mg  daily. - Stop the Flonase and change to Nasacort one spray per nostril daily as needed.  - Continue with Astelin one spray per nostril twice daily as needed.  3. Return in about 6 months (around 08/25/2020).   Subjective:   Valerie Henderson is a 54 y.o. female presenting today for follow up of  Chief Complaint  Patient presents with  . Asthma    VAISHALI BAISE has a history of the following: Patient Active Problem List   Diagnosis Date Noted  . Encounter for gynecological examination with Papanicolaou smear of cervix 02/14/2019  . Vaginal dryness 02/01/2017  . Seasonal and perennial allergic rhinitis 12/26/2016  . Moderate persistent asthma without complication 62/69/4854  . Elevated BP 06/10/2015  . Post-operative state 10/22/2013  . Endometriosis  of pelvis 09/16/2013  . Cervical os stenosis 08/06/2013  . Menorrhagia 08/06/2013  . Screening for colorectal cancer 01/14/2013  . Pre-op testing 12/25/2012  . Fibromyalgia 12/16/2012  . Irregular bleeding 12/05/2012  . Hypothyroid 12/05/2012  . Constipation 12/05/2012    History obtained from: chart review and patient.  Valerie Henderson is a 54 y.o. female presenting for a follow up visit. She was last seen in April 2021. At that time, lung function looked better. We changed her to Trelegy one puff once daily as well as Fasenra in conjunction with albuterol as needed. For her rhinitis, we continue with cetirizine 10mg  daily. We stopped the Flonase and changed to Nasacort one spray per nostril daily as needed. WE also continued with Astelin one spray per nostril twice daily as needed.   Since the last visit, she has mostly done well. However, she did have bronchitis shortly before being diagnosed with COVID19 in September. She and her husband are fully vaccinated and both she and her husband had the infusion done.   Asthma/Respiratory Symptom History: She reports that she has been doing well. She has been on [place] for 2021.  Deborrah's asthma has been well controlled. She has not required rescue medication, experienced nocturnal awakenings due to lower respiratory symptoms, nor have activities of daily living been limited. She has required no Emergency Department or Urgent Care visits for her asthma. She has required zero courses of systemic steroids for asthma exacerbations since the last visit. ACT score today is 25, indicating excellent asthma symptom control.    Allergic Rhinitis Symptom History: She remains on the fluticasone as  well as the cetirizine daily. She has not needed antibiotics at all for her symptoms. Overall she feels that she is doing fairly well.  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  Otherwise, there have been no changes to her  past medical history, surgical history, family history, or social history.    Review of Systems  Constitutional: Negative.  Negative for chills, fever, malaise/fatigue and weight loss.  HENT: Negative for congestion, ear discharge, ear pain and sinus pain.   Eyes: Negative for pain, discharge and redness.  Respiratory: Negative for cough, sputum production, shortness of breath and wheezing.   Cardiovascular: Negative.  Negative for chest pain and palpitations.  Gastrointestinal: Negative for abdominal pain, constipation, diarrhea, heartburn, nausea and vomiting.  Skin: Negative.  Negative for itching and rash.  Neurological: Negative for dizziness and headaches.  Endo/Heme/Allergies: Positive for environmental allergies. Does not bruise/bleed easily.       Objective:   Blood pressure 110/90, pulse 79, temperature 99.3 F (37.4 C), temperature source Temporal, resp. rate 18, last menstrual period 08/15/2013, SpO2 97 %. There is no height or weight on file to calculate BMI.   Physical Exam:  Physical Exam Constitutional:      Appearance: She is well-developed.     Comments: Pleasant female. Very friendly.   HENT:     Head: Normocephalic and atraumatic.     Right Ear: Tympanic membrane, ear canal and external ear normal.     Left Ear: Tympanic membrane, ear canal and external ear normal.     Nose: No nasal deformity, septal deviation, mucosal edema or rhinorrhea.     Right Turbinates: Enlarged and swollen.     Left Turbinates: Enlarged and swollen.     Right Sinus: No maxillary sinus tenderness or frontal sinus tenderness.     Left Sinus: No maxillary sinus tenderness or frontal sinus tenderness.     Mouth/Throat:     Mouth: Mucous membranes are not pale and not dry.     Pharynx: Uvula midline.     Comments: Cobblestoning present in the posterior oropharynx. Eyes:     General:        Right eye: No discharge.        Left eye: No discharge.     Conjunctiva/sclera:  Conjunctivae normal.     Right eye: Right conjunctiva is not injected. No chemosis.    Left eye: Left conjunctiva is not injected. No chemosis.    Pupils: Pupils are equal, round, and reactive to light.  Cardiovascular:     Rate and Rhythm: Normal rate and regular rhythm.     Heart sounds: Normal heart sounds.  Pulmonary:     Effort: Pulmonary effort is normal. No tachypnea, accessory muscle usage or respiratory distress.     Breath sounds: Normal breath sounds. No wheezing, rhonchi or rales.     Comments: Moving air well in all lung fields. No increased work of breathing noted.  Chest:     Chest wall: No tenderness.  Lymphadenopathy:     Cervical: No cervical adenopathy.  Skin:    Coloration: Skin is not pale.     Findings: No abrasion, erythema, petechiae or rash. Rash is not papular, urticarial or vesicular.     Comments: No eczematous lesions noted.   Neurological:     Mental Status: She is alert.  Psychiatric:        Behavior: Behavior is cooperative.  Diagnostic studies:    Spirometry: results abnormal (FEV1: 1.41/52%, FVC: 1.89/59%, FEV1/FVC: 75%).    Spirometry consistent with normal pattern.    Allergy Studies: none        Salvatore Marvel, MD  [facility] [place] [facility]

## 2020-02-27 NOTE — Addendum Note (Signed)
Addended by: Farrel Demark R on: 02/27/2020 10:16 AM   Modules accepted: Orders

## 2020-03-18 ENCOUNTER — Other Ambulatory Visit: Payer: Self-pay | Admitting: Allergy & Immunology

## 2020-06-21 DIAGNOSIS — I10 Essential (primary) hypertension: Secondary | ICD-10-CM | POA: Diagnosis not present

## 2020-06-21 DIAGNOSIS — E039 Hypothyroidism, unspecified: Secondary | ICD-10-CM | POA: Diagnosis not present

## 2020-06-21 DIAGNOSIS — Z0189 Encounter for other specified special examinations: Secondary | ICD-10-CM | POA: Diagnosis not present

## 2020-06-21 DIAGNOSIS — G4700 Insomnia, unspecified: Secondary | ICD-10-CM | POA: Diagnosis not present

## 2020-06-21 DIAGNOSIS — E669 Obesity, unspecified: Secondary | ICD-10-CM | POA: Diagnosis not present

## 2020-06-21 DIAGNOSIS — Z0001 Encounter for general adult medical examination with abnormal findings: Secondary | ICD-10-CM | POA: Diagnosis not present

## 2020-06-21 DIAGNOSIS — R7301 Impaired fasting glucose: Secondary | ICD-10-CM | POA: Diagnosis not present

## 2020-06-28 DIAGNOSIS — I10 Essential (primary) hypertension: Secondary | ICD-10-CM | POA: Diagnosis not present

## 2020-06-28 DIAGNOSIS — R7301 Impaired fasting glucose: Secondary | ICD-10-CM | POA: Diagnosis not present

## 2020-06-28 DIAGNOSIS — G4700 Insomnia, unspecified: Secondary | ICD-10-CM | POA: Diagnosis not present

## 2020-06-28 DIAGNOSIS — E039 Hypothyroidism, unspecified: Secondary | ICD-10-CM | POA: Diagnosis not present

## 2020-07-16 ENCOUNTER — Other Ambulatory Visit: Payer: Self-pay | Admitting: Allergy & Immunology

## 2020-07-29 ENCOUNTER — Other Ambulatory Visit: Payer: Self-pay | Admitting: Allergy & Immunology

## 2020-08-15 ENCOUNTER — Other Ambulatory Visit: Payer: Self-pay | Admitting: Allergy & Immunology

## 2020-08-23 ENCOUNTER — Other Ambulatory Visit: Payer: Self-pay

## 2020-08-23 ENCOUNTER — Other Ambulatory Visit (HOSPITAL_COMMUNITY)
Admission: RE | Admit: 2020-08-23 | Discharge: 2020-08-23 | Disposition: A | Discharge status: Home / Self Care | Payer: BC Managed Care – PPO | Source: Ambulatory Visit | Attending: Adult Health | Admitting: Adult Health

## 2020-08-23 ENCOUNTER — Encounter: Payer: Self-pay | Admitting: Adult Health

## 2020-08-23 ENCOUNTER — Ambulatory Visit (INDEPENDENT_AMBULATORY_CARE_PROVIDER_SITE_OTHER): Payer: BC Managed Care – PPO | Admitting: Adult Health

## 2020-08-23 VITALS — BP 125/85 | HR 73 | Ht 58.5 in | Wt 200.0 lb

## 2020-08-23 DIAGNOSIS — Z01419 Encounter for gynecological examination (general) (routine) without abnormal findings: Secondary | ICD-10-CM | POA: Insufficient documentation

## 2020-08-23 DIAGNOSIS — Z1211 Encounter for screening for malignant neoplasm of colon: Secondary | ICD-10-CM

## 2020-08-23 DIAGNOSIS — Z1231 Encounter for screening mammogram for malignant neoplasm of breast: Secondary | ICD-10-CM | POA: Diagnosis not present

## 2020-08-23 LAB — HEMOCCULT GUIAC POC 1CARD (OFFICE): Fecal Occult Blood, POC: NEGATIVE

## 2020-08-23 NOTE — Progress Notes (Signed)
Patient ID: Valerie Henderson, female   DOB: [birth year], 55 y.o.   MRN: [id number] History of Present Illness: Valerie Henderson is a 55 year old white female, married, sp [facility] in for a well woman gyn exam and pap. She is a Emergency planning/management officer. PCP is Dr Valerie Henderson.  Current Medications, Allergies, Past Medical History, Past Surgical History, Family History and Social History were reviewed in [employer] record.     Review of Systems:  Patient denies any headaches, hearing loss, fatigue, blurred vision, shortness of breath, chest pain, abdominal pain, problems with bowel movements, urination, or intercourse. No joint pain or mood swings.   Physical Exam:BP 125/85 (BP Location: Left Arm, Patient Position: Sitting, Cuff Size: Normal)   Pulse 73   Ht 4' 10.5" (1.486 m)   Wt 200 lb (90.7 kg)   LMP 08/15/2013 Comment: [place]  BMI 41.09 kg/m  General:  Well developed, well nourished, no acute distress Skin:  Warm and dry Neck:  Midline trachea, normal thyroid, good ROM, no lymphadenopathy Lungs; Clear to auscultation bilaterally Breast:  No dominant palpable mass, retraction, or nipple discharge Cardiovascular: Regular rate and rhythm Abdomen:  Soft, non tender, no hepatosplenomegaly Pelvic:  External genitalia is normal in appearance, no lesions.  The vagina is normal in appearance. Urethra has no lesions or masses. The cervix is smooth, pap with HR HPV genotyping performed.  Uterus is felt to be normal size, shape, and contour.  No adnexal masses or tenderness noted.Bladder is non tender, no masses felt. Rectal: Good sphincter tone, no polyps, or hemorrhoids felt.  Hemoccult negative. Extremities/musculoskeletal:  No swelling, + varicosities noted, no clubbing or cyanosis Psych:  No mood changes, alert and cooperative,seems happy AA is 2 Fall risk is low PHQ 9 score is 0 GAD 7 score is 2  [place] - 08/23/20 1536      Pregnancy Intention Screening   Does the patient want to become pregnant in the  next year? N/A    Does the patient's partner want to become pregnant in the next year? N/A    Would the patient like to discuss contraceptive options today? N/A      Contraception Wrap Up   Current Method Female Sterilization   [facility]   End Method Female Sterilization   [facility]   Contraception Counseling Provided No         Examination chaperoned by Valerie Bamberg LPN  Impression and Plan: 1. Encounter for gynecological examination with Papanicolaou smear of cervix Pap sent Physical and labs with PCP Pap in 3 years if normal  Colonoscopy per GI  2. Encounter for screening fecal occult blood testing  3. Screening mammogram for breast cancer Get mammogram in October, call for appt

## 2020-08-25 ENCOUNTER — Ambulatory Visit: Payer: BC Managed Care – PPO | Admitting: Allergy & Immunology

## 2020-08-26 LAB — CYTOLOGY - PAP
Comment: NEGATIVE
Diagnosis: NEGATIVE
High risk HPV: NEGATIVE

## 2020-09-01 ENCOUNTER — Ambulatory Visit: Payer: BC Managed Care – PPO | Admitting: Allergy & Immunology

## 2020-09-01 ENCOUNTER — Encounter: Payer: Self-pay | Admitting: Allergy & Immunology

## 2020-09-01 ENCOUNTER — Other Ambulatory Visit: Payer: Self-pay

## 2020-09-01 VITALS — BP 102/60 | HR 87 | Temp 98.2°F | Resp 14 | Ht 59.0 in | Wt 205.2 lb

## 2020-09-01 DIAGNOSIS — J3089 Other allergic rhinitis: Secondary | ICD-10-CM | POA: Diagnosis not present

## 2020-09-01 DIAGNOSIS — J4540 Moderate persistent asthma, uncomplicated: Secondary | ICD-10-CM | POA: Diagnosis not present

## 2020-09-01 DIAGNOSIS — J302 Other seasonal allergic rhinitis: Secondary | ICD-10-CM

## 2020-09-01 MED ORDER — TRIAMCINOLONE ACETONIDE 55 MCG/ACT NA AERO
1.0000 | INHALATION_SPRAY | Freq: Every day | NASAL | 3 refills | Status: DC | PRN
Start: 2020-09-01 — End: 2022-12-26

## 2020-09-01 MED ORDER — ALBUTEROL SULFATE HFA 108 (90 BASE) MCG/ACT IN AERS
INHALATION_SPRAY | RESPIRATORY_TRACT | 1 refills | Status: DC
Start: 2020-09-01 — End: 2022-07-12

## 2020-09-01 NOTE — Progress Notes (Signed)
FOLLOW UP  Date of Service/Encounter:  09/01/20   Assessment:   Moderate persistent asthma, uncomplicated- with worsening control since the last visit  Seasonal and perennial allergic rhinitis(grass, trees, mold, dust mite, and cat)  Hypothyroidism  Osteopenia- incidental finding on X-ray  Plan/Recommendations:   1. Moderate persistent asthma, uncomplicated - Lung function looked very stable today.  - I think going with the Judithann Sauger was the right idea. - We can stop the [facility] for now.  - Copay card provided for the [facility]. - Daily controller medication(s): Breztri two puffs twice daily  - Prior to physical activity: ProAir 2 puffs 10-15 minutes before physical activity. - Rescue medications: ProAir 4 puffs every 4-6 hours as needed - Asthma control goals:  * Full participation in all desired activities (may need albuterol before activity) * Albuterol use two time or less a week on average (not counting use with activity) * Cough interfering with sleep two time or less a month * Oral steroids no more than once a year * No hospitalizations  2. Perennial allergic rhinitis (grass, trees, mold, dust mite, and cat) - Continue with cetirizine 10mg  daily. - Continue with Nasacort one spray per nostril daily as needed.  - Continue with Astelin one spray per nostril twice daily as needed.  3. Return in about 6 months (around 03/04/2021).   Subjective:   JASELLE PRYER is a 55 y.o. female presenting today for follow up of  Chief Complaint  Patient presents with  . Moderate persistent asthma without complication  . Moderate presistent asthma without complication    GWENITH TSCHIDA has a history of the following: Patient Active Problem List   Diagnosis Date Noted  . Encounter for screening fecal occult blood testing 08/23/2020  . Encounter for gynecological examination with Papanicolaou smear of cervix 02/14/2019  . Vaginal dryness 02/01/2017  . Seasonal and perennial  allergic rhinitis 12/26/2016  . Moderate persistent asthma without complication 57/32/2025  . Elevated BP 06/10/2015  . Post-operative state 10/22/2013  . Endometriosis of pelvis 09/16/2013  . Cervical os stenosis 08/06/2013  . Menorrhagia 08/06/2013  . Screening for colorectal cancer 01/14/2013  . Pre-op testing 12/25/2012  . Fibromyalgia 12/16/2012  . Irregular bleeding 12/05/2012  . Hypothyroid 12/05/2012  . Constipation 12/05/2012    History obtained from: chart review and patient.  Jyllian is a 55 y.o. female presenting for a follow up visit.  We last saw her in October 2021.  At that time, her lung function looks stable.  We continued her on Trelegy 200 mcg 1 puff once daily.  We also continued with ProAir as needed.  For her allergic rhinitis we continue with cetirizine.  Stopped her Flonase and changed to Nasacort 1 spray per nostril daily as needed.  We also continued with Astelin.  Since last visit, she has done fairly well.   Asthma/Respiratory Symptom History: She was having some hoarseness and was changed from Trelegy to [place]. She was having a lot of throat clearing. She remains on the [place]. She is unsure whether this is helping a ton. She does have a spacer and has been using this. She has not needed systemic steroids at all. Overall she has done fairly well.   Allergic Rhinitis Symptom History: She was taking cetirizine. Now she is on levocetirizine. She is unsure whether this is working any better. She is using a nose spray - Nasacort - fairly regularly. She likes that this does not smell like flowers. She has not needed  antibiotics at all for any infections since the last visit.   Otherwise, there have been no changes to her past medical history, surgical history, family history, or social history.    Review of Systems  Constitutional: Negative.  Negative for chills, fever, malaise/fatigue and weight loss.  HENT: Negative.  Negative for congestion, ear discharge,  ear pain, sinus pain and sore throat.   Eyes: Negative for pain, discharge and redness.  Respiratory: Negative for cough, sputum production, shortness of breath and wheezing.   Cardiovascular: Negative.  Negative for chest pain and palpitations.  Gastrointestinal: Negative for abdominal pain, constipation, diarrhea, heartburn, nausea and vomiting.  Skin: Negative.  Negative for itching and rash.  Neurological: Negative for dizziness and headaches.  Endo/Heme/Allergies: Negative for environmental allergies. Does not bruise/bleed easily.       Objective:   Blood pressure 102/60, pulse 87, temperature 98.2 F (36.8 C), resp. rate 14, height 4\' 11"  (1.499 m), weight 205 lb 3.2 oz (93.1 kg), last menstrual period 08/15/2013, SpO2 97 %. Body mass index is 41.45 kg/m.   Physical Exam:  Physical Exam Constitutional:      Appearance: She is well-developed.  HENT:     Head: Normocephalic and atraumatic.     Right Ear: Tympanic membrane, ear canal and external ear normal.     Left Ear: Tympanic membrane, ear canal and external ear normal.     Nose: No nasal deformity, septal deviation, mucosal edema or rhinorrhea.     Right Turbinates: Enlarged and swollen.     Left Turbinates: Enlarged and swollen.     Right Sinus: No maxillary sinus tenderness or frontal sinus tenderness.     Left Sinus: No maxillary sinus tenderness or frontal sinus tenderness.     Mouth/Throat:     Mouth: Mucous membranes are not pale and not dry.     Pharynx: Uvula midline.  Eyes:     General:        Right eye: No discharge.        Left eye: No discharge.     Conjunctiva/sclera: Conjunctivae normal.     Right eye: Right conjunctiva is not injected. No chemosis.    Left eye: Left conjunctiva is not injected. No chemosis.    Pupils: Pupils are equal, round, and reactive to light.  Cardiovascular:     Rate and Rhythm: Normal rate and regular rhythm.     Heart sounds: Normal heart sounds.  Pulmonary:      Effort: Pulmonary effort is normal. No tachypnea, accessory muscle usage or respiratory distress.     Breath sounds: Normal breath sounds. No wheezing, rhonchi or rales.     Comments: Moving air well in all lung fields. No increased work of breathing noted.  Chest:     Chest wall: No tenderness.  Lymphadenopathy:     Cervical: No cervical adenopathy.  Skin:    General: Skin is warm.     Capillary Refill: Capillary refill takes less than 2 seconds.     Coloration: Skin is not pale.     Findings: No abrasion, erythema, petechiae or rash. Rash is not papular, urticarial or vesicular.  Neurological:     Mental Status: She is alert.  Psychiatric:        Behavior: Behavior is cooperative.      Diagnostic studies:    Spirometry: results abnormal (FEV1: 1.24/55%, FVC: 1.72/62%, FEV1/FVC: 72%).    Spirometry consistent with possible restrictive disease.   Allergy Studies: none  Salvatore Marvel, MD  Allergy and [facility] [facility] [place]

## 2020-09-01 NOTE — Patient Instructions (Addendum)
1. Moderate persistent asthma, uncomplicated - Lung function looked very stable today.  - I think going with the Judithann Sauger was the right idea. - We can stop the [facility] for now.  - Copay card provided for the [facility]. - Daily controller medication(s): Breztri two puffs twice daily  - Prior to physical activity: ProAir 2 puffs 10-15 minutes before physical activity. - Rescue medications: ProAir 4 puffs every 4-6 hours as needed - Asthma control goals:  * Full participation in all desired activities (may need albuterol before activity) * Albuterol use two time or less a week on average (not counting use with activity) * Cough interfering with sleep two time or less a month * Oral steroids no more than once a year * No hospitalizations  2. Perennial allergic rhinitis (grass, trees, mold, dust mite, and cat) - Continue with cetirizine 10mg  daily. - Continue with Nasacort one spray per nostril daily as needed.  - Continue with Astelin one spray per nostril twice daily as needed.  3. Return in about 6 months (around 03/04/2021).    Please inform us of any Emergency Department visits, hospitalizations, or changes in symptoms. Call us before going to the ED for breathing or allergy symptoms since we might be able to fit you in for a sick visit. Feel free to contact us anytime with any questions, problems, or concerns.  It was a pleasure to see you again today!  Websites that have reliable patient information: 1. American Academy of Asthma, Allergy, and Immunology: www.aaaai.org 2. Food Allergy Research and Education (FARE): foodallergy.org 3. Mothers of Asthmatics: http://www.asthmacommunitynetwork.org 4. American College of Allergy, Asthma, and Immunology: www.acaai.org   COVID-19 Vaccine Information can be found at: ShippingScam.co.uk For questions related to vaccine distribution or appointments, please email [email or web address]  or call [phone].   We realize that you might be concerned about having an allergic reaction to the COVID19 vaccines. To help with that concern, WE ARE OFFERING THE COVID19 VACCINES IN OUR OFFICE! Ask the front desk for dates!     "Like" [place] on Facebook and Instagram for our latest updates!      A healthy democracy works best when [employer] participate! Make sure you are registered to vote! If you have moved or changed any of your contact information, you will need to get this updated before voting!  In some cases, you MAY be able to register to vote online: [email or web address]

## 2020-09-29 ENCOUNTER — Other Ambulatory Visit: Payer: Self-pay | Admitting: Allergy & Immunology

## 2020-11-13 ENCOUNTER — Telehealth: Payer: BC Managed Care – PPO | Admitting: Physician Assistant

## 2020-11-13 ENCOUNTER — Encounter: Payer: Self-pay | Admitting: Physician Assistant

## 2020-11-13 DIAGNOSIS — B351 Tinea unguium: Secondary | ICD-10-CM

## 2020-11-13 MED ORDER — TERBINAFINE HCL 250 MG PO TABS: 250.0000 mg | ORAL_TABLET | Freq: Every day | ORAL | 0 refills | Status: DC

## 2020-11-13 NOTE — Progress Notes (Signed)
Virtual Visit via Video Note  I connected with Christophe Louis on 11/13/20 at  1:00 PM EDT by a video enabled telemedicine application and verified that I am speaking with the correct person using two identifiers.  Location:  Patient: pt's home Provider: provider's office    Person participating in the virtual visit: pt and provider      I discussed the limitations of evaluation and management by telemedicine and the availability of in person appointments. The patient expressed understanding and agreed to proceed.     I discussed the assessment and treatment plan with the patient. The patient was provided an opportunity to ask questions and all were answered. The patient agreed with the plan and demonstrated an understanding of the instructions.   The patient was advised to call back or seek an in-person evaluation if the symptoms worsen or if the condition fails to improve as anticipated.  I provided 15 minutes of non-face-to-face time during this encounter.   Waldon Merl, PA-C   Acute Office Visit  Subjective:    Patient ID: Valerie Henderson, female    DOB: April 17, 1966, 55 y.o.   MRN: [id number]  No chief complaint on file.   55 yo F in NAD connects via video for R big toenail fungal infection x 1.5 weeks. .states the toenail is with yellow discoloration and is hardened on the outer part of the nail.  Has had similar symptoms and was prescribed oral pills unsure name. Has been using OTC creams without success. Denies any injury/surgery, fever, chills, redness/swelling/skin warmth/ pain with toe movement, or inability to more the toe.  Patient is in today for toenail fungal infection  Past Medical History:  Diagnosis Date   Anemia    Asthma    Complication of anesthesia    Constipation 12/05/2012   Elevated BP 06/10/2015   Endometriosis    Fibromyalgia 12/16/2012   GERD (gastroesophageal reflux disease)    Hypothyroid 12/05/2012   Hypothyroidism    Insomnia    Irregular bleeding  12/05/2012   Endometrial  ablation 9/14   PONV (postoperative nausea and vomiting)     Past Surgical History:  Procedure Laterality Date   CHOLECYSTECTOMY     DILITATION & CURRETTAGE/HYSTROSCOPY WITH THERMACHOICE ABLATION N/A 01/14/2013   Procedure: DILATATION & CURETTAGE/HYSTEROSCOPY WITH THERMACHOICE ABLATION;  Surgeon: Jonnie Kind, MD;  Location: [facility];  Service: Gynecology;  Laterality: N/A;  8 ml D5W total in, 8 ml D5W total out; 86-88 degrees Celcius; 8 minutes and 39 seconds total time   LAPAROSCOPIC BILATERAL SALPINGECTOMY Bilateral 01/14/2013   Procedure: LAPAROSCOPIC BILATERAL SALPINGECTOMY;  Surgeon: Jonnie Kind, MD;  Location: [facility];  Service: Gynecology;  Laterality: Bilateral;   OOPHORECTOMY Bilateral 09/16/2013   Procedure: OOPHORECTOMY;  Surgeon: Jonnie Kind, MD;  Location: [facility];  Service: Gynecology;  Laterality: Bilateral;   SUPRACERVICAL ABDOMINAL HYSTERECTOMY N/A 09/16/2013   Procedure: HYSTERECTOMY SUPRACERVICAL ABDOMINAL;  Surgeon: Jonnie Kind, MD;  Location: [facility];  Service: Gynecology;  Laterality: N/A;    Family History  Problem Relation Age of Onset   Heart disease Mother        heart attack   Hypertension Mother    Heart disease Father        heart attack   Hypertension Father    Cancer Maternal Aunt        breast   Cancer Maternal Grandmother        uterine   Cancer Maternal Grandfather  lung   Cancer Paternal Grandmother        uterine   Hypertension Brother    Hypertension Sister    Hypertension Other    Colon cancer Neg Hx    Allergic rhinitis Neg Hx    Angioedema Neg Hx    Asthma Neg Hx    Atopy Neg Hx    Eczema Neg Hx    Immunodeficiency Neg Hx    Urticaria Neg Hx     Social History   Socioeconomic History   Marital status: Married    Spouse name: Not on file   Number of children: Not on file   Years of education: Not on file   Highest education level: Not on file  Occupational History   Occupation:  self-employed    Employer: SELF EMPLOYED    Comment: [employer]  Tobacco Use   Smoking status: Former    Years: 2.50    Types: Cigarettes   Smokeless tobacco: Never   Tobacco comments:    only used for a few years during her teens   Media planner   Vaping Use: Never used  Substance and Sexual Activity   Alcohol use: Yes    Comment: occassional   Drug use: No   Sexual activity: Yes    Birth control/protection: Surgical    Comment: supracervical hyst  Other Topics Concern   Not on file  Social History Narrative   Not on file   Social Determinants of Health   Financial Resource Strain: Low Risk    Difficulty of Paying Living Expenses: Not hard at all  Food Insecurity: No Food Insecurity   Worried About Charity fundraiser in the Last Year: Never true   [place] in the Last Year: Never true  Transportation Needs: No Transportation Needs   Lack of Transportation (Medical): No   Lack of Transportation (Non-Medical): No  Physical Activity: Insufficiently Active   Days of Exercise per Week: 2 days   Minutes of Exercise per Session: 20 min  Stress: No Stress Concern Present   Feeling of Stress : Not at all  Social Connections: Socially Integrated   Frequency of Communication with Friends and Family: More than three times a week   Frequency of Social Gatherings with Friends and Family: More than three times a week   Attends Religious Services: More than 4 times per year   Active Member of [employer] or Organizations: Yes   Attends Archivist Meetings: 1 to 4 times per year   Marital Status: Married  Human resources officer Violence: Not At Risk   Fear of Current or Ex-Partner: No   Emotionally Abused: No   Physically Abused: No   Sexually Abused: No    Outpatient Medications Prior to Visit  Medication Sig Dispense Refill   albuterol (VENTOLIN HFA) 108 (90 Base) MCG/ACT inhaler INHALE 2 PUFFS INTO THE LUNGS EVERY 4 TO 6 HOURS AS NEEDED FOR COUGH OR WHEEZING OR  SHORTNESS OF BREATH OR CHEST TIGHTNESS 8.5 g 1   amLODipine (NORVASC) 5 MG tablet Take 5 mg by mouth daily.  8   BREZTRI AEROSPHERE 160-9-4.8 MCG/ACT AERO SMARTSIG:2 Puff(s) By Mouth Twice Daily     budesonide-formoterol (SYMBICORT) 160-4.5 MCG/ACT inhaler Inhale 2 puffs into the lungs once daily. Rinse, gargle and spit after use. 10.2 g 5   cholecalciferol (VITAMIN D) 1000 units tablet Take 1,000 Units by mouth daily.     FASENRA PEN 30 MG/ML SOAJ Inject into the  skin.     FOLIC ACID PO Take by mouth daily.     gabapentin (NEURONTIN) 100 MG capsule as needed.      levothyroxine (SYNTHROID) 137 MCG tablet      MAGNESIUM PO Take by mouth daily.     Multiple Vitamin (MULTIVITAMIN) tablet Take 1 tablet by mouth daily.     tiotropium (SPIRIVA) 18 MCG inhalation capsule Place 18 mcg into inhaler and inhale. 1 puff 4 times a week. (Patient not taking: No sig reported)     triamcinolone (NASACORT) 55 MCG/ACT AERO nasal inhaler Place 1 spray into the nose daily as needed. 16.5 g 3   UNABLE TO FIND Vit C gummies-takes 2 daily     valACYclovir HCl (VALTREX PO) Take by mouth as needed.     VENTOLIN HFA 108 (90 Base) MCG/ACT inhaler 2 puffs every 6 (six) hours as needed.  (Patient not taking: No sig reported)  1   zolpidem (AMBIEN) 5 MG tablet take 1 tablet by mouth at bedtime if needed  0   No facility-administered medications prior to visit.    Allergies  Allergen Reactions   Neosporin [Neomycin-Bacitracin Zn-Polymyx] Rash    Review of Systems  Constitutional:  Negative for chills and fever.  Respiratory:  Negative for shortness of breath.   Cardiovascular:  Negative for chest pain.  Gastrointestinal:  Negative for abdominal pain, nausea and vomiting.  Musculoskeletal:  Negative for arthralgias and joint swelling.  Skin:  Positive for color change. Negative for rash and wound.  Neurological:  Negative for dizziness.  Psychiatric/Behavioral:  Negative for confusion.       Objective:     Physical Exam Constitutional:      General: She is not in acute distress.    Appearance: Normal appearance. She is not ill-appearing, toxic-appearing or diaphoretic.  HENT:     Head: Normocephalic and atraumatic.  Eyes:     General: No scleral icterus. Pulmonary:     Effort: Pulmonary effort is normal.  Musculoskeletal:     Comments: On video- R toenail is hardened and with yellow discoloration. No redness/swelling on video. Pt is able to move the toe without any pain.  Skin:    Coloration: Skin is not pale.  Neurological:     Mental Status: She is alert and oriented to person, place, and time.  Psychiatric:        Mood and Affect: Mood normal.        Behavior: Behavior normal.        Thought Content: Thought content normal.        Judgment: Judgment normal.    LMP 08/15/2013 Comment: [facility] Readings from Last 3 Encounters:  09/01/20 205 lb 3.2 oz (93.1 kg)  08/23/20 200 lb (90.7 kg)  07/18/19 205 lb 3.2 oz (93.1 kg)    Health Maintenance Due  Topic Date Due   COVID-19 Vaccine (1) Never done   HIV Screening  Never done   Hepatitis C Screening  Never done   TETANUS/TDAP  Never done   Zoster Vaccines- Shingrix (1 of 2) Never done    There are no preventive care reminders to display for this patient.   Lab Results  Component Value Date   TSH 2.440 06/14/2015   Lab Results  Component Value Date   WBC 8.4 07/23/2019   HGB 12.5 07/23/2019   HCT 38.1 07/23/2019   MCV 83 07/23/2019   PLT 246 07/23/2019   Lab Results  Component Value Date  NA 138 04/22/2018   K 4.1 04/22/2018   CO2 28 04/22/2018   GLUCOSE 99 04/22/2018   BUN 13 04/22/2018   CREATININE 0.57 04/22/2018   BILITOT 0.4 06/14/2015   ALKPHOS 50 06/14/2015   AST 16 06/14/2015   ALT 14 06/14/2015   PROT 6.6 06/14/2015   ALBUMIN 4.2 06/14/2015   CALCIUM 9.4 04/22/2018   ANIONGAP 7 04/22/2018   Lab Results  Component Value Date   CHOL 188 06/14/2015   Lab Results  Component Value Date   HDL  63 06/14/2015   Lab Results  Component Value Date   LDLCALC 111 (H) 06/14/2015   Lab Results  Component Value Date   TRIG 68 06/14/2015   Lab Results  Component Value Date   CHOLHDL 3.0 06/14/2015   Lab Results  Component Value Date   HGBA1C 5.7 (H) 06/14/2015       Assessment & Plan:   Problem List Items Addressed This Visit   None Visit Diagnoses     Onychomycosis    -  Primary   Relevant Medications   terbinafine (LAMISIL) 250 MG tablet        Meds ordered this encounter  Medications   terbinafine (LAMISIL) 250 MG tablet    Sig: Take 1 tablet (250 mg total) by mouth daily.    Dispense:  14 tablet    Refill:  0    Order Specific Question:   Supervising Provider    Answer:   Noemi Chapel [[id number]]   Denies any hx of liver disease- advised to f/u with pcp to complete the course of abx as likely needed for toenail infections.   Waldon Merl, PA-C

## 2020-12-27 DIAGNOSIS — I10 Essential (primary) hypertension: Secondary | ICD-10-CM | POA: Diagnosis not present

## 2021-01-13 DIAGNOSIS — Z0001 Encounter for general adult medical examination with abnormal findings: Secondary | ICD-10-CM | POA: Diagnosis not present

## 2021-02-28 ENCOUNTER — Other Ambulatory Visit: Payer: Self-pay

## 2021-02-28 ENCOUNTER — Ambulatory Visit (HOSPITAL_COMMUNITY)
Admission: RE | Admit: 2021-02-28 | Discharge: 2021-02-28 | Disposition: A | Discharge status: Home / Self Care | Payer: BC Managed Care – PPO | Source: Ambulatory Visit | Attending: Adult Health | Admitting: Adult Health

## 2021-02-28 DIAGNOSIS — Z1231 Encounter for screening mammogram for malignant neoplasm of breast: Secondary | ICD-10-CM | POA: Diagnosis not present

## 2021-03-09 ENCOUNTER — Other Ambulatory Visit: Payer: Self-pay

## 2021-03-09 ENCOUNTER — Ambulatory Visit: Payer: BC Managed Care – PPO | Admitting: Allergy & Immunology

## 2021-03-09 VITALS — BP 130/78 | HR 78 | Temp 98.3°F | Resp 16 | Ht 59.0 in | Wt 206.6 lb

## 2021-03-09 DIAGNOSIS — K219 Gastro-esophageal reflux disease without esophagitis: Secondary | ICD-10-CM | POA: Diagnosis not present

## 2021-03-09 DIAGNOSIS — J302 Other seasonal allergic rhinitis: Secondary | ICD-10-CM

## 2021-03-09 DIAGNOSIS — J3089 Other allergic rhinitis: Secondary | ICD-10-CM | POA: Diagnosis not present

## 2021-03-09 DIAGNOSIS — J4540 Moderate persistent asthma, uncomplicated: Secondary | ICD-10-CM | POA: Diagnosis not present

## 2021-03-09 MED ORDER — FAMOTIDINE 40 MG PO TABS: 40.0000 mg | ORAL_TABLET | Freq: Every day | ORAL | 5 refills | Status: DC

## 2021-03-09 MED ORDER — BREZTRI AEROSPHERE 160-9-4.8 MCG/ACT IN AERO: 2.0000 | INHALATION_SPRAY | Freq: Two times a day (BID) | RESPIRATORY_TRACT | 5 refills | Status: AC

## 2021-03-09 NOTE — Progress Notes (Signed)
FOLLOW UP  Date of Service/Encounter:  03/09/21   Assessment:   Moderate persistent asthma, uncomplicated - with worsening control since the last visit   Seasonal and perennial allergic rhinitis (grass, trees, mold, dust mite, and cat)   Hypothyroidism   Osteopenia - incidental finding on X-ray (treated with calcium and vitamin D supplementation)  GERD - starting Pepcid today  Plan/Recommendations:   1. Moderate persistent asthma, uncomplicated - Lung function looked very stable today.  - Daily controller medication(s): Breztri two puffs twice daily  - Prior to physical activity: ProAir 2 puffs 10-15 minutes before physical activity. - Rescue medications: ProAir 4 puffs every 4-6 hours as needed - Asthma control goals:  * Full participation in all desired activities (may need albuterol before activity) * Albuterol use two time or less a week on average (not counting use with activity) * Cough interfering with sleep two time or less a month * Oral steroids no more than once a year * No hospitalizations  2. Perennial allergic rhinitis (grass, trees, mold, dust mite, and cat) - Continue with cetirizine 10mg  daily. - Continue with Nasacort one spray per nostril daily as needed.  - Continue with Astelin one spray per nostril twice daily as needed.  3. Concern for uncontrolled GERD - I think this might be related to silent reflux, so we should start Pepcid daily.  4. Return in about 3 months (around 06/09/2021).   Subjective:   Valerie Henderson is a 55 y.o. female presenting today for follow up of  Chief Complaint  Patient presents with   Follow-up    Patient in today for a follow up and states that she is having to clear her throat more and sometimes has a cough.    Valerie Henderson has a history of the following: Patient Active Problem List   Diagnosis Date Noted   Encounter for screening fecal occult blood testing 08/23/2020   Encounter for gynecological examination with  Papanicolaou smear of cervix 02/14/2019   Vaginal dryness 02/01/2017   Seasonal and perennial allergic rhinitis 12/26/2016   Moderate persistent asthma without complication 05/09/3233   Elevated BP 06/10/2015   Post-operative state 10/22/2013   Endometriosis of pelvis 09/16/2013   Cervical os stenosis 08/06/2013   Menorrhagia 08/06/2013   Screening for colorectal cancer 01/14/2013   Pre-op testing 12/25/2012   Fibromyalgia 12/16/2012   Irregular bleeding 12/05/2012   Hypothyroid 12/05/2012   Constipation 12/05/2012    History obtained from: chart review and patient.  Valerie Henderson is a 55 y.o. female presenting for a follow up visit.  She was last seen in May 2020 2018.  At that time, her lung function looked excellent.  We decided to stop her Berna Bue and continue with Breztri 2 puffs twice daily.  We also continue with albuterol as needed.  For her allergic rhinitis, we continue with cetirizine as well as Nasacort and Astelin.  Since last visit, she reports that she has done well.   Asthma/Respiratory Symptom History: She is using the -Breztri two puffs twice daily with a spacer. It is still covered by her insurance at least until her deductible resets.  She has not needed steroids since we saw her at all. She feels that she is seldom sick.   Allergic Rhinitis Symptom History: She has been doing a lot of throat clearing. She does do the nose sprays intermittently. She does have her Astelin and Nasacort as needed.   GERD Symptom History: This tends to come and go.  Eating seems to make this worse. She has not noticed that it is a certain that she eats. It is not anything in particular from a food perspective. She has woken up a couple of times at night and she reports that she had a lot of burning and she was drinking a lot of milk to calm things down. She does not take any reflux medication on a routine basis. Onions tend to make things worse.   Otherwise, there have been no changes to her past  medical history, surgical history, family history, or social history.    Review of Systems  Constitutional: Negative.  Negative for chills, fever, malaise/fatigue and weight loss.  HENT: Negative.  Negative for congestion, ear discharge and ear pain.   Eyes:  Negative for pain, discharge and redness.  Respiratory:  Negative for cough, sputum production, shortness of breath and wheezing.   Cardiovascular: Negative.  Negative for chest pain and palpitations.  Gastrointestinal:  Positive for heartburn and nausea. Negative for abdominal pain, constipation, diarrhea and vomiting.  Skin: Negative.  Negative for itching and rash.  Neurological:  Negative for dizziness and headaches.  Endo/Heme/Allergies:  Negative for environmental allergies. Does not bruise/bleed easily.      Objective:   Blood pressure 130/78, pulse 78, temperature 98.3 F (36.8 C), temperature source Temporal, resp. rate 16, height 4\' 11"  (1.499 m), weight 206 lb 9.6 oz (93.7 kg), last menstrual period 08/15/2013, SpO2 97 %. Body mass index is 41.73 kg/m.   Physical Exam:  Physical Exam Vitals reviewed.  Constitutional:      Appearance: She is well-developed.     Comments: Talkative.  HENT:     Head: Normocephalic and atraumatic.     Right Ear: Tympanic membrane, ear canal and external ear normal.     Left Ear: Tympanic membrane, ear canal and external ear normal.     Nose: No nasal deformity, septal deviation, mucosal edema or rhinorrhea.     Right Turbinates: Enlarged and swollen.     Left Turbinates: Enlarged and swollen.     Right Sinus: No maxillary sinus tenderness or frontal sinus tenderness.     Left Sinus: No maxillary sinus tenderness or frontal sinus tenderness.     Mouth/Throat:     Mouth: Mucous membranes are not pale and not dry.     Pharynx: Uvula midline.  Eyes:     General: Lids are normal. No allergic shiner.       Right eye: No discharge.        Left eye: No discharge.      Conjunctiva/sclera: Conjunctivae normal.     Right eye: Right conjunctiva is not injected. No chemosis.    Left eye: Left conjunctiva is not injected. No chemosis.    Pupils: Pupils are equal, round, and reactive to light.  Cardiovascular:     Rate and Rhythm: Normal rate and regular rhythm.     Heart sounds: Normal heart sounds.  Pulmonary:     Effort: Pulmonary effort is normal. No tachypnea, accessory muscle usage or respiratory distress.     Breath sounds: Normal breath sounds. No wheezing, rhonchi or rales.  Chest:     Chest wall: No tenderness.  Lymphadenopathy:     Cervical: No cervical adenopathy.  Skin:    Coloration: Skin is not pale.     Findings: No abrasion, erythema, petechiae or rash. Rash is not papular, urticarial or vesicular.  Neurological:     Mental Status: She is alert.  Psychiatric:  Behavior: Behavior is cooperative.     Diagnostic studies:    Spirometry: results abnormal (FEV1: 1.28/57%, FVC: 1.90/69%, FEV1/FVC: 67%).    Spirometry consistent with mixed obstructive and restrictive disease.    Allergy Studies: none        Salvatore Marvel, MD  [facility] [place]

## 2021-03-09 NOTE — Patient Instructions (Addendum)
1. Moderate persistent asthma, uncomplicated - Lung function looked very stable today.  - Daily controller medication(s): Breztri two puffs twice daily  - Prior to physical activity: ProAir 2 puffs 10-15 minutes before physical activity. - Rescue medications: ProAir 4 puffs every 4-6 hours as needed - Asthma control goals:  * Full participation in all desired activities (may need albuterol before activity) * Albuterol use two time or less a week on average (not counting use with activity) * Cough interfering with sleep two time or less a month * Oral steroids no more than once a year * No hospitalizations  2. Perennial allergic rhinitis (grass, trees, mold, dust mite, and cat) - Continue with cetirizine 10mg  daily. - Continue with Nasacort one spray per nostril daily as needed.  - Continue with Astelin one spray per nostril twice daily as needed.  3. Concern for uncontrolled GERD - I think this might be related to silent reflux, so we should start Pepcid daily.  3. Return in about 3 months (around 06/09/2021).    Please inform us of any Emergency Department visits, hospitalizations, or changes in symptoms. Call us before going to the ED for breathing or allergy symptoms since we might be able to fit you in for a sick visit. Feel free to contact us anytime with any questions, problems, or concerns.  It was a pleasure to see you again today!  Websites that have reliable patient information: 1. American Academy of Asthma, Allergy, and Immunology: www.aaaai.org 2. Food Allergy Research and Education (FARE): foodallergy.org 3. Mothers of Asthmatics: http://www.asthmacommunitynetwork.org 4. American College of Allergy, Asthma, and Immunology: www.acaai.org   COVID-19 Vaccine Information can be found at: ShippingScam.co.uk For questions related to vaccine distribution or appointments, please email vaccine@[email or web address] .com or call  [phone].   We realize that you might be concerned about having an allergic reaction to the COVID19 vaccines. To help with that concern, WE ARE OFFERING THE COVID19 VACCINES IN OUR OFFICE! Ask the front desk for dates!     "Like" [place] on Facebook and Instagram for our latest updates!      A healthy democracy works best when [employer] participate! Make sure you are registered to vote! If you have moved or changed any of your contact information, you will need to get this updated before voting!  In some cases, you MAY be able to register to vote online: [email or web address]

## 2021-03-14 ENCOUNTER — Encounter: Payer: Self-pay | Admitting: Allergy & Immunology

## 2021-03-14 DIAGNOSIS — K219 Gastro-esophageal reflux disease without esophagitis: Secondary | ICD-10-CM | POA: Insufficient documentation

## 2021-06-03 ENCOUNTER — Other Ambulatory Visit: Payer: Self-pay

## 2021-06-03 ENCOUNTER — Telehealth: Payer: Self-pay

## 2021-06-03 ENCOUNTER — Encounter: Payer: Self-pay | Admitting: Emergency Medicine

## 2021-06-03 ENCOUNTER — Ambulatory Visit
Admission: EM | Admit: 2021-06-03 | Discharge: 2021-06-03 | Disposition: A | Discharge status: Home / Self Care | Payer: BC Managed Care – PPO | Attending: Family Medicine | Admitting: Family Medicine

## 2021-06-03 DIAGNOSIS — J0100 Acute maxillary sinusitis, unspecified: Secondary | ICD-10-CM

## 2021-06-03 DIAGNOSIS — J4541 Moderate persistent asthma with (acute) exacerbation: Secondary | ICD-10-CM

## 2021-06-03 MED ORDER — BENZONATATE 200 MG PO CAPS: 200.0000 mg | ORAL_CAPSULE | Freq: Three times a day (TID) | ORAL | 0 refills | Status: DC | PRN

## 2021-06-03 MED ORDER — AMOXICILLIN-POT CLAVULANATE 875-125 MG PO TABS: 1.0000 | ORAL_TABLET | Freq: Two times a day (BID) | ORAL | 0 refills | Status: DC

## 2021-06-03 MED ORDER — DEXAMETHASONE SODIUM PHOSPHATE 10 MG/ML IJ SOLN
10.0000 mg | Freq: Once | INTRAMUSCULAR | Status: AC
  Administered 2021-06-03: 10 mg via INTRAMUSCULAR

## 2021-06-03 NOTE — Telephone Encounter (Signed)
PA has been submitted through CoverMyMeds for [facility] and is currently pending approval/denial. Called and informed patient of pending PA. Patient verbalized understanding.

## 2021-06-03 NOTE — Telephone Encounter (Signed)
Patient called stating her pharmacy told her the Judithann Sauger is requiring a prior [place].   [facility] [place]   Please Advise

## 2021-06-03 NOTE — ED Provider Notes (Signed)
[facility]    CSN: [id number] Arrival date & time: 06/03/21  0831      History   Chief Complaint Chief Complaint  Patient presents with   Nasal Congestion    HPI Valerie Henderson is a 56 y.o. female.   Presenting today with about a week and a half of nasal congestion, sinus pain and pressure, chest tightness, hacking productive cough.  She denies fever, chills, chest pain, abdominal pain, nausea vomiting or diarrhea.  She states she has been taking over-the-counter cold and congestion medications her typical maintenance and rescue inhalers, Allegra daily for seasonal allergies but her symptoms have been worsening over the past few days despite this.  She states she gets this issue several times a year and typically requires a steroid shot and antibiotics to get better.   Past Medical History:  Diagnosis Date   Anemia    Asthma    Complication of anesthesia    Constipation 12/05/2012   Elevated BP 06/10/2015   Endometriosis    Fibromyalgia 12/16/2012   GERD (gastroesophageal reflux disease)    Hypothyroid 12/05/2012   Hypothyroidism    Insomnia    Irregular bleeding 12/05/2012   Endometrial  ablation 9/14   PONV (postoperative nausea and vomiting)     Patient Active Problem List   Diagnosis Date Noted   Gastroesophageal reflux disease 03/14/2021   Encounter for screening fecal occult blood testing 08/23/2020   Encounter for gynecological examination with Papanicolaou smear of cervix 02/14/2019   Vaginal dryness 02/01/2017   Seasonal and perennial allergic rhinitis 12/26/2016   Moderate persistent asthma without complication 21/19/4174   Elevated BP 06/10/2015   Post-operative state 10/22/2013   Endometriosis of pelvis 09/16/2013   Cervical os stenosis 08/06/2013   Menorrhagia 08/06/2013   Screening for colorectal cancer 01/14/2013   Pre-op testing 12/25/2012   Fibromyalgia 12/16/2012   Irregular bleeding 12/05/2012   Hypothyroid 12/05/2012   Constipation  12/05/2012    Past Surgical History:  Procedure Laterality Date   CHOLECYSTECTOMY     DILITATION & CURRETTAGE/HYSTROSCOPY WITH THERMACHOICE ABLATION N/A 01/14/2013   Procedure: DILATATION & CURETTAGE/HYSTEROSCOPY WITH THERMACHOICE ABLATION;  Surgeon: Jonnie Kind, MD;  Location: [facility];  Service: Gynecology;  Laterality: N/A;  8 ml D5W total in, 8 ml D5W total out; 86-88 degrees Celcius; 8 minutes and 39 seconds total time   LAPAROSCOPIC BILATERAL SALPINGECTOMY Bilateral 01/14/2013   Procedure: LAPAROSCOPIC BILATERAL SALPINGECTOMY;  Surgeon: Jonnie Kind, MD;  Location: [facility];  Service: Gynecology;  Laterality: Bilateral;   OOPHORECTOMY Bilateral 09/16/2013   Procedure: OOPHORECTOMY;  Surgeon: Jonnie Kind, MD;  Location: [facility];  Service: Gynecology;  Laterality: Bilateral;   SUPRACERVICAL ABDOMINAL HYSTERECTOMY N/A 09/16/2013   Procedure: HYSTERECTOMY SUPRACERVICAL ABDOMINAL;  Surgeon: Jonnie Kind, MD;  Location: [facility];  Service: Gynecology;  Laterality: N/A;    OB History     Gravida  1   Para  1   Term      Preterm      AB      Living  1      SAB      IAB      Ectopic      Multiple      Live Births  1            Home Medications    Prior to Admission medications   Medication Sig Start Date End Date Taking? Authorizing Provider  amoxicillin-clavulanate (AUGMENTIN) 875-125 MG tablet Take  1 tablet by mouth every 12 (twelve) hours. 06/03/21  Yes Volney American, PA-C  benzonatate (TESSALON) 200 MG capsule Take 1 capsule (200 mg total) by mouth 3 (three) times daily as needed for cough. 06/03/21  Yes Volney American, PA-C  albuterol (VENTOLIN HFA) 108 (90 Base) MCG/ACT inhaler INHALE 2 PUFFS INTO THE LUNGS EVERY 4 TO 6 HOURS AS NEEDED FOR COUGH OR WHEEZING OR SHORTNESS OF BREATH OR CHEST TIGHTNESS 09/01/20   Valentina Shaggy, MD  amLODipine (NORVASC) 5 MG tablet Take 5 mg by mouth daily. 06/19/17   [provider]  cholecalciferol  (VITAMIN D) 1000 units tablet Take 1,000 Units by mouth daily.    [provider]  famotidine (PEPCID) 40 MG tablet Take 1 tablet (40 mg total) by mouth daily. 03/09/21 04/08/21  Valentina Shaggy, MD  FOLIC ACID PO Take by mouth daily.    [provider]  gabapentin (NEURONTIN) 100 MG capsule as needed.  12/30/18   [provider]  levothyroxine (SYNTHROID) 137 MCG tablet  12/20/18   [provider]  MAGNESIUM PO Take by mouth daily.    [provider]  Multiple Vitamin (MULTIVITAMIN) tablet Take 1 tablet by mouth daily.    [provider]  triamcinolone (NASACORT) 55 MCG/ACT AERO nasal inhaler Place 1 spray into the nose daily as needed. 09/01/20   Valentina Shaggy, MD  UNABLE TO FIND Vit C gummies-takes 2 daily    [provider]  valACYclovir HCl (VALTREX PO) Take by mouth as needed.    [provider]  zolpidem (AMBIEN) 5 MG tablet take 1 tablet by mouth at bedtime if needed 05/24/17   [provider]    Family History Family History  Problem Relation Age of Onset   Heart disease Mother        heart attack   Hypertension Mother    Heart disease Father        heart attack   Hypertension Father    Cancer Maternal Aunt        breast   Cancer Maternal Grandmother        uterine   Cancer Maternal Grandfather        lung   Cancer Paternal Grandmother        uterine   Hypertension Brother    Hypertension Sister    Hypertension Other    Colon cancer Neg Hx    Allergic rhinitis Neg Hx    Angioedema Neg Hx    Asthma Neg Hx    Atopy Neg Hx    Eczema Neg Hx    Immunodeficiency Neg Hx    Urticaria Neg Hx     Social History Social History   Tobacco Use   Smoking status: Former    Years: 2.50    Types: Cigarettes   Smokeless tobacco: Never   Tobacco comments:    only used for a few years during her teens   Vaping Use   Vaping Use: Never used  Substance Use Topics   Alcohol use: Yes     Comment: occassional   Drug use: No     Allergies   Neosporin [neomycin-bacitracin zn-polymyx]   Review of Systems Review of Systems Per HPI  Physical Exam Triage Vital Signs ED Triage Vitals [06/03/21 0945]  Enc Vitals Group     BP (!) 163/85     Pulse Rate 74     Resp 18     Temp 97.9 F (36.6  C)     Temp Source Oral     SpO2 95 %     Weight 200 lb (90.7 kg)     Height 5' (1.524 m)     Head Circumference      Peak Flow      Pain Score 0     Pain Loc      Pain Edu?      Excl. in [place]?    No data found.  Updated Vital Signs BP (!) 163/85 (BP Location: Right Arm)    Pulse 74    Temp 97.9 F (36.6 C) (Oral)    Resp 18    Ht 5' (1.524 m)    Wt 200 lb (90.7 kg)    LMP 08/15/2013 Comment: [place]   SpO2 95%    BMI 39.06 kg/m   Visual Acuity Right Eye Distance:   Left Eye Distance:   Bilateral Distance:    Right Eye Near:   Left Eye Near:    Bilateral Near:     Physical Exam Vitals and nursing note reviewed.  Constitutional:      Appearance: Normal appearance.  HENT:     Head: Atraumatic.     Right Ear: Tympanic membrane and external ear normal.     Left Ear: Tympanic membrane and external ear normal.     Nose: Congestion present.     Mouth/Throat:     Mouth: Mucous membranes are moist.     Pharynx: Posterior oropharyngeal erythema present.  Eyes:     Extraocular Movements: Extraocular movements intact.     Conjunctiva/sclera: Conjunctivae normal.  Cardiovascular:     Rate and Rhythm: Normal rate and regular rhythm.     Heart sounds: Normal heart sounds.  Pulmonary:     Effort: Pulmonary effort is normal.     Breath sounds: Normal breath sounds. No wheezing or rales.  Musculoskeletal:        General: Normal range of motion.     Cervical back: Normal range of motion and neck supple.  Skin:    General: Skin is warm and dry.  Neurological:     Mental Status: She is alert and oriented to person, place, and time.  Psychiatric:        Mood and Affect: Mood  normal.        Thought Content: Thought content normal.     UC Treatments / Results  Labs (all labs ordered are listed, but only abnormal results are displayed) Labs Reviewed - No data to display  EKG   Radiology No results found.  Procedures Procedures (including critical care time)  Medications Ordered in UC Medications  dexamethasone (DECADRON) injection 10 mg (10 mg Intramuscular Given 06/03/21 1036)    Initial Impression / Assessment and Plan / UC Course  I have reviewed the triage vital signs and the nursing notes.  Pertinent labs & imaging results that were available during my care of the patient were reviewed by me and considered in my medical decision making (see chart for details).     Given duration and worsening course of symptoms, will treat with IM Decadron, Augmentin, Tessalon Perles, Mucinex and continued over-the-counter supportive medications and home care.  Continue asthma and allergy regimen additionally.  Return for acutely worsening symptoms.  Final Clinical Impressions(s) / UC Diagnoses   Final diagnoses:  Acute maxillary sinusitis, recurrence not specified  Moderate persistent asthma with acute exacerbation   Discharge Instructions   None    ED Prescriptions  Medication Sig Dispense Auth. Provider   amoxicillin-clavulanate (AUGMENTIN) 875-125 MG tablet Take 1 tablet by mouth every 12 (twelve) hours. 14 tablet Volney American, [place]   benzonatate (TESSALON) 200 MG capsule Take 1 capsule (200 mg total) by mouth 3 (three) times daily as needed for cough. 20 capsule Volney American, [place]      PDMP not reviewed this encounter.   Volney American, [place] 06/03/21 1037

## 2021-06-03 NOTE — ED Triage Notes (Signed)
Pt reports nasal congestion, chest congestion x1 week. Pt reports history of asthma and reports "seemed like I was getting better but the congestion is thicker and harder to break up now."

## 2021-06-08 ENCOUNTER — Ambulatory Visit: Payer: BC Managed Care – PPO | Admitting: Allergy & Immunology

## 2021-07-13 DIAGNOSIS — R7301 Impaired fasting glucose: Secondary | ICD-10-CM | POA: Diagnosis not present

## 2021-07-13 DIAGNOSIS — E039 Hypothyroidism, unspecified: Secondary | ICD-10-CM | POA: Diagnosis not present

## 2021-07-15 ENCOUNTER — Other Ambulatory Visit: Payer: Self-pay

## 2021-07-15 ENCOUNTER — Ambulatory Visit: Payer: BC Managed Care – PPO | Admitting: Allergy & Immunology

## 2021-07-15 VITALS — BP 118/80 | HR 76 | Temp 99.3°F | Resp 16 | Ht 59.0 in | Wt 209.0 lb

## 2021-07-15 DIAGNOSIS — J3089 Other allergic rhinitis: Secondary | ICD-10-CM

## 2021-07-15 DIAGNOSIS — K219 Gastro-esophageal reflux disease without esophagitis: Secondary | ICD-10-CM

## 2021-07-15 DIAGNOSIS — J4540 Moderate persistent asthma, uncomplicated: Secondary | ICD-10-CM | POA: Diagnosis not present

## 2021-07-15 DIAGNOSIS — J302 Other seasonal allergic rhinitis: Secondary | ICD-10-CM

## 2021-07-15 MED ORDER — AZITHROMYCIN 250 MG PO TABS: ORAL_TABLET | ORAL | 0 refills | Status: DC

## 2021-07-15 MED ORDER — FAMOTIDINE 40 MG PO TABS: 40.0000 mg | ORAL_TABLET | Freq: Two times a day (BID) | ORAL | 5 refills | Status: DC

## 2021-07-15 NOTE — Patient Instructions (Addendum)
1. Moderate persistent asthma, uncomplicated ?- Lung function looked very stable today.  ?- Daily controller medication(s): Breztri two puffs twice daily  ?- Prior to physical activity: ProAir 2 puffs 10-15 minutes before physical activity. ?- Rescue medications: ProAir 4 puffs every 4-6 hours as needed ?- Asthma control goals:  ?* Full participation in all desired activities (may need albuterol before activity) ?* Albuterol use two time or less a week on average (not counting use with activity) ?* Cough interfering with sleep two time or less a month ?* Oral steroids no more than once a year ?* No hospitalizations ? ?2. Perennial allergic rhinitis (grass, trees, mold, dust mite, and cat) - with persistent cough ?- Continue with cetirizine '10mg'$  dail ?- Continue with Nasacort one spray per nostril daily AS NEEDED. ?- Continue with Astelin one spray per nostril twice daily AS NEEDED. ?- For the cough, we are going to add on azithromycin to cover whatever might not have been covered with the Augmentin. ?- Take two tablets on the first day and then one tablet daily for four more days.  ? ?3. Concern for uncontrolled GERD ?- Take Pepcid TWICE daily until you see [place] again.  ? ?3. Return in about 2 months (around 09/14/2021).  ? ? ?Please inform us of any Emergency Department visits, hospitalizations, or changes in symptoms. Call us before going to the ED for breathing or allergy symptoms since we might be able to fit you in for a sick visit. Feel free to contact us anytime with any questions, problems, or concerns. ? ?It was a pleasure to see you again today! ? ?Websites that have reliable patient information: ?1. American Academy of Asthma, Allergy, and Immunology: www.aaaai.org ?2. Food Allergy Research and Education (FARE): foodallergy.org ?3. Mothers of Asthmatics: http://www.asthmacommunitynetwork.org ?4. [employer] of Allergy, Asthma, and Immunology: MonthlyElectricBill.co.uk ? ? ?COVID-19 Vaccine Information can be found  at: ShippingScam.co.uk For questions related to vaccine distribution or appointments, please email [email or web address] or call [phone].  ? ?We realize that you might be concerned about having an allergic reaction to the COVID19 vaccines. To help with that concern, WE ARE OFFERING THE COVID19 VACCINES IN OUR OFFICE! Ask the front desk for dates!  ? ? ? ??Like? [place] on Facebook and Instagram for our latest updates!  ?  ? ? ?A healthy democracy works best when [employer] participate! Make sure you are registered to vote! If you have moved or changed any of your contact information, you will need to get this updated before voting! ? ?In some cases, you MAY be able to register to vote online: [email or web address] ? ? ? ? ?

## 2021-07-15 NOTE — Progress Notes (Signed)
? ?FOLLOW UP ? ?Date of Service/Encounter:  07/15/21 ? ? ?Assessment:  ? ?Moderate persistent asthma, uncomplicated - with worsening control since the last visit ?  ?Seasonal and perennial allergic rhinitis (grass, trees, mold, dust mite, and cat) ?  ?Hypothyroidism ?  ?Osteopenia - incidental finding on X-ray (treated with calcium and vitamin D supplementation) ?  ?GERD - recommended continuing with Pepcid today ? ?Continued cough despite completing Augmentin - consider CXR ?  ? ?Plan/Recommendations:  ? ?1. Moderate persistent asthma, uncomplicated ?- Lung function looked very stable today.  ?- Daily controller medication(s): Breztri two puffs twice daily  ?- Prior to physical activity: ProAir 2 puffs 10-15 minutes before physical activity. ?- Rescue medications: ProAir 4 puffs every 4-6 hours as needed ?- Asthma control goals:  ?* Full participation in all desired activities (may need albuterol before activity) ?* Albuterol use two time or less a week on average (not counting use with activity) ?* Cough interfering with sleep two time or less a month ?* Oral steroids no more than once a year ?* No hospitalizations ? ?2. Perennial allergic rhinitis (grass, trees, mold, dust mite, and cat) - with persistent cough ?- Continue with cetirizine '10mg'$  dail ?- Continue with Nasacort one spray per nostril daily AS NEEDED. ?- Continue with Astelin one spray per nostril twice daily AS NEEDED. ?- For the cough, we are going to add on azithromycin to cover whatever might not have been covered with the Augmentin. ?- Take two tablets on the first day and then one tablet daily for four more days.  ? ?3. Concern for uncontrolled GERD ?- Take Pepcid TWICE daily until you see [place] again.  ? ?3. Return in about 2 months (around 09/14/2021).  ? ?Subjective:  ? ?Valerie Henderson is a 56 y.o. female presenting today for follow up of  ?Chief Complaint  ?Patient presents with  ? Asthma  ?  Says he asthma is ok. Went to Urgent Care several weeks  ago and was tested for covid and flu and all back negative.  ?Says she has the bark cough now.  ?ACT:21  ? ? ?Christophe Louis has a history of the following: ?Patient Active Problem List  ? Diagnosis Date Noted  ? Gastroesophageal reflux disease 03/14/2021  ? Encounter for screening fecal occult blood testing 08/23/2020  ? Encounter for gynecological examination with Papanicolaou smear of cervix 02/14/2019  ? Vaginal dryness 02/01/2017  ? Seasonal and perennial allergic rhinitis 12/26/2016  ? Moderate persistent asthma without complication 33/29/5188  ? Elevated BP 06/10/2015  ? Post-operative state 10/22/2013  ? Endometriosis of pelvis 09/16/2013  ? Cervical os stenosis 08/06/2013  ? Menorrhagia 08/06/2013  ? Screening for colorectal cancer 01/14/2013  ? Pre-op testing 12/25/2012  ? Fibromyalgia 12/16/2012  ? Irregular bleeding 12/05/2012  ? Hypothyroid 12/05/2012  ? Constipation 12/05/2012  ? ? ?History obtained from: chart review and patient. ? ?Valerie Henderson is a 56 y.o. female presenting for a follow up visit.  She was last seen in November 2022.  At that time, her lung function looked stable.  We continued with Breztri 2 puffs twice daily as well as albuterol as needed.  For her allergic rhinitis, would continue with cetirizine as well as Astelin and Nasacort.  We did feel that she might have an component of GERD contributing to her symptoms, so we started Pepcid daily. ? ?Since last visit, she has done well. ? ?Asthma/Respiratory Symptom History: She remains on the Breztri two puffs twice daily. It  is affordable for now. She has been using her albuterol but is unsure whether it helps the cough at all.  However, she has not needed prednisone and has not been in the emergency room for her symptoms.  She does report excellence sleep.  At least we will regards to her breathing. ? ?Allergic Rhinitis Symptom History: Allergic rhinitis is controlled with the Nasacort as well as the Astelin.  She is much better about taking her  antihistamine, but she will use the nose sprays if she feels that she needs them. ? ?She was placed on an antibiotic on February 3rd. She completed the antibiotic and she feels that she is still coughing. It is productive of phlegm but it is not a lot of it. She was treated with Augmentin as well as Tessalon pearls and Mucinex. But she continues to have this continued cough that is still there. She is not having chest pain with this at all.  ? ?GERD Symptom History: She has the Pepcid at home, but she does not take it before every meal.  We have not done PPIs since she has a history of the bone demineralization.  We do not want to worsen that at all. ? ?They are monitoring her bone mineralization for now.  She has not started any bisphosphonates.  She has tried to increase her calcium intake from her diet. ? ?Otherwise, there have been no changes to her past medical history, surgical history, family history, or social history. ? ? ? ?Review of Systems  ?Constitutional: Negative.  Negative for chills, fever, malaise/fatigue and weight loss.  ?HENT: Negative.  Negative for congestion, ear discharge, ear pain, sinus pain and sore throat.   ?Eyes:  Negative for pain, discharge and redness.  ?Respiratory:  Positive for cough. Negative for sputum production, shortness of breath and wheezing.   ?Cardiovascular: Negative.  Negative for chest pain and palpitations.  ?Gastrointestinal:  Negative for abdominal pain, constipation, diarrhea, heartburn, nausea and vomiting.  ?Skin: Negative.  Negative for itching and rash.  ?Neurological:  Negative for dizziness and headaches.  ?Endo/Heme/Allergies:  Negative for environmental allergies. Does not bruise/bleed easily.   ? ? ? ?Objective:  ? ?Blood pressure 118/80, pulse 76, temperature 99.3 ?F (37.4 ?C), temperature source Temporal, resp. rate 16, height '4\' 11"'$  (1.499 m), weight 209 lb (94.8 kg), last menstrual period 08/15/2013, SpO2 96 %. ?Body mass index is 42.21  kg/m?. ? ? ? ?Physical Exam ?Vitals reviewed.  ?Constitutional:   ?   Appearance: She is well-developed.  ?   Comments: Talkative.  Pleasant.  Well-appearing.  ?HENT:  ?   Head: Normocephalic and atraumatic.  ?   Right Ear: Tympanic membrane, ear canal and external ear normal.  ?   Left Ear: Tympanic membrane, ear canal and external ear normal.  ?   Nose: No nasal deformity, septal deviation, mucosal edema or rhinorrhea.  ?   Right Turbinates: Enlarged and swollen.  ?   Left Turbinates: Enlarged and swollen.  ?   Right Sinus: No maxillary sinus tenderness or frontal sinus tenderness.  ?   Left Sinus: No maxillary sinus tenderness or frontal sinus tenderness.  ?   Mouth/Throat:  ?   Mouth: Mucous membranes are not pale and not dry.  ?   Pharynx: Uvula midline.  ?Eyes:  ?   General: Lids are normal. No allergic shiner.    ?   Right eye: No discharge.     ?   Left eye: No discharge.  ?  Conjunctiva/sclera: Conjunctivae normal.  ?   Right eye: Right conjunctiva is not injected. No chemosis. ?   Left eye: Left conjunctiva is not injected. No chemosis. ?   Pupils: Pupils are equal, round, and reactive to light.  ?Cardiovascular:  ?   Rate and Rhythm: Normal rate and regular rhythm.  ?   Heart sounds: Normal heart sounds.  ?Pulmonary:  ?   Effort: Pulmonary effort is normal. No tachypnea, accessory muscle usage or respiratory distress.  ?   Breath sounds: Normal breath sounds. No wheezing, rhonchi or rales.  ?Chest:  ?   Chest wall: No tenderness.  ?Lymphadenopathy:  ?   Cervical: No cervical adenopathy.  ?Skin: ?   Coloration: Skin is not pale.  ?   Findings: No abrasion, erythema, petechiae or rash. Rash is not papular, urticarial or vesicular.  ?Neurological:  ?   Mental Status: She is alert.  ?Psychiatric:     ?   Behavior: Behavior is cooperative.  ?  ? ?Diagnostic studies:   ? ?Spirometry: results abnormal (FEV1: 1.23/2.22%, FVC: 1.71/62%, FEV1/FVC: 72%).  ?  ?Spirometry consistent with moderate obstructive disease.   This is fairly stable for her. ? ?Allergy Studies: none ? ? ? ? ? ?  ?Salvatore Marvel, MD  ?[facility] ? ? ? ? ? ? ?

## 2021-07-18 DIAGNOSIS — E039 Hypothyroidism, unspecified: Secondary | ICD-10-CM | POA: Diagnosis not present

## 2021-07-18 DIAGNOSIS — J4540 Moderate persistent asthma, uncomplicated: Secondary | ICD-10-CM | POA: Diagnosis not present

## 2021-07-18 DIAGNOSIS — G4700 Insomnia, unspecified: Secondary | ICD-10-CM | POA: Diagnosis not present

## 2021-07-18 DIAGNOSIS — I10 Essential (primary) hypertension: Secondary | ICD-10-CM | POA: Diagnosis not present

## 2021-07-19 ENCOUNTER — Encounter: Payer: Self-pay | Admitting: Allergy & Immunology

## 2021-08-26 DIAGNOSIS — I10 Essential (primary) hypertension: Secondary | ICD-10-CM | POA: Diagnosis not present

## 2021-08-26 DIAGNOSIS — Z6841 Body Mass Index (BMI) 40.0 and over, adult: Secondary | ICD-10-CM | POA: Diagnosis not present

## 2021-09-08 DIAGNOSIS — I10 Essential (primary) hypertension: Secondary | ICD-10-CM | POA: Diagnosis not present

## 2021-09-08 DIAGNOSIS — Z6841 Body Mass Index (BMI) 40.0 and over, adult: Secondary | ICD-10-CM | POA: Diagnosis not present

## 2021-09-17 DIAGNOSIS — I10 Essential (primary) hypertension: Secondary | ICD-10-CM | POA: Diagnosis not present

## 2021-09-28 ENCOUNTER — Ambulatory Visit: Payer: BC Managed Care – PPO | Admitting: Allergy & Immunology

## 2021-09-28 ENCOUNTER — Encounter: Payer: Self-pay | Admitting: Allergy & Immunology

## 2021-09-28 ENCOUNTER — Other Ambulatory Visit: Payer: Self-pay

## 2021-09-28 VITALS — BP 130/80 | HR 94 | Temp 98.1°F | Resp 17

## 2021-09-28 DIAGNOSIS — J4540 Moderate persistent asthma, uncomplicated: Secondary | ICD-10-CM

## 2021-09-28 DIAGNOSIS — K219 Gastro-esophageal reflux disease without esophagitis: Secondary | ICD-10-CM | POA: Diagnosis not present

## 2021-09-28 DIAGNOSIS — J3089 Other allergic rhinitis: Secondary | ICD-10-CM

## 2021-09-28 DIAGNOSIS — J302 Other seasonal allergic rhinitis: Secondary | ICD-10-CM

## 2021-09-28 MED ORDER — FAMOTIDINE 20 MG PO TABS: 20.0000 mg | ORAL_TABLET | Freq: Two times a day (BID) | ORAL | 5 refills | Status: DC

## 2021-09-28 NOTE — Patient Instructions (Addendum)
1. Moderate persistent asthma, uncomplicated - Lung function looked very stable today.  - Daily controller medication(s): Breztri two puffs twice daily  - Prior to physical activity: ProAir 2 puffs 10-15 minutes before physical activity. - Rescue medications: ProAir 4 puffs every 4-6 hours as needed - Asthma control goals:  * Full participation in all desired activities (may need albuterol before activity) * Albuterol use two time or less a week on average (not counting use with activity) * Cough interfering with sleep two time or less a month * Oral steroids no more than once a year * No hospitalizations  2. Perennial allergic rhinitis (grass, trees, mold, dust mite, and cat) - Continue with cetirizine '10mg'$  daily - Continue with Nasacort one spray per nostril daily AS NEEDED. - Continue with Astelin one spray per nostril twice daily AS NEEDED.  3. Concern for uncontrolled GERD - Continue with Pepcid twice daily (I sent in a script to see if this is covered better).   4. Return in about 4 months (around 01/28/2022).    Please inform us of any Emergency Department visits, hospitalizations, or changes in symptoms. Call us before going to the ED for breathing or allergy symptoms since we might be able to fit you in for a sick visit. Feel free to contact us anytime with any questions, problems, or concerns.  It was a pleasure to see you again today!  Websites that have reliable patient information: 1. American Academy of Asthma, Allergy, and Immunology: www.aaaai.org 2. Food Allergy Research and Education (FARE): foodallergy.org 3. Mothers of Asthmatics: http://www.asthmacommunitynetwork.org 4. American College of Allergy, Asthma, and Immunology: www.acaai.org   COVID-19 Vaccine Information can be found at: ShippingScam.co.uk For questions related to vaccine distribution or appointments, please email [email or web address] or  call [phone].   We realize that you might be concerned about having an allergic reaction to the COVID19 vaccines. To help with that concern, WE ARE OFFERING THE COVID19 VACCINES IN OUR OFFICE! Ask the front desk for dates!     "Like" [place] on Facebook and Instagram for our latest updates!      A healthy democracy works best when [employer] participate! Make sure you are registered to vote! If you have moved or changed any of your contact information, you will need to get this updated before voting!  In some cases, you MAY be able to register to vote online: [email or web address]

## 2021-09-28 NOTE — Progress Notes (Unsigned)
FOLLOW UP  Date of Service/Encounter:  09/28/21   Assessment:   Moderate persistent asthma, uncomplicated - with worsening control since the last visit   Seasonal and perennial allergic rhinitis (grass, trees, mold, dust mite, and cat)   Hypothyroidism   Osteopenia - incidental finding on X-ray (treated with calcium and vitamin D supplementation)   GERD - recommended continuing with Pepcid today   Rash - improved with cessation of amlodipine   Plan/Recommendations:   1. Moderate persistent asthma, uncomplicated - Lung function looked very stable today.  - Daily controller medication(s): Breztri two puffs twice daily  - Prior to physical activity: ProAir 2 puffs 10-15 minutes before physical activity. - Rescue medications: ProAir 4 puffs every 4-6 hours as needed - Asthma control goals:  * Full participation in all desired activities (may need albuterol before activity) * Albuterol use two time or less a week on average (not counting use with activity) * Cough interfering with sleep two time or less a month * Oral steroids no more than once a year * No hospitalizations  2. Perennial allergic rhinitis (grass, trees, mold, dust mite, and cat) - Continue with cetirizine $RemoveBeforeD'10mg'[id number]$  daily - Continue with Nasacort one spray per nostril daily AS NEEDED. - Continue with Astelin one spray per nostril twice daily AS NEEDED.  3. Concern for uncontrolled GERD - Continue with Pepcid twice daily (I sent in a script to see if this is covered better).   4. Return in about 4 months (around 01/28/2022).   Subjective:   Valerie Henderson is a 56 y.o. female presenting today for follow up of  Chief Complaint  Patient presents with   Asthma   Allergic Rhinitis     KELCE BOUTON has a history of the following: Patient Active Problem List   Diagnosis Date Noted   Gastroesophageal reflux disease 03/14/2021   Encounter for screening fecal occult blood testing 08/23/2020   Encounter for  gynecological examination with Papanicolaou smear of cervix 02/14/2019   Vaginal dryness 02/01/2017   Seasonal and perennial allergic rhinitis 12/26/2016   Moderate persistent asthma without complication 73/22/0254   Elevated BP 06/10/2015   Post-operative state 10/22/2013   Endometriosis of pelvis 09/16/2013   Cervical os stenosis 08/06/2013   Menorrhagia 08/06/2013   Screening for colorectal cancer 01/14/2013   Pre-op testing 12/25/2012   Fibromyalgia 12/16/2012   Irregular bleeding 12/05/2012   Hypothyroid 12/05/2012   Constipation 12/05/2012    History obtained from: chart review and patient.  Valerie Henderson is a 56 y.o. female presenting for a follow up visit.  She was last seen in March 2023.  At that time, her lung function looked very stable.  We continue with Breztri 2 puffs twice daily and albuterol as needed.  For her rhinitis, we continue with cetirizine as well as Nasacort and Astelin as needed.  She was continued to have coughing after a sinusitis episode that was treated with Augmentin.  We added on azithromycin for 5 days to see if this would help.  For her reflux, we recommended Pepcid twice daily.  Since last visit, she has largely done well.   She actually was on amlodipine. She was increased to 10 mg and this resulted in an rash up her legs. She went off of it and the rash improved. She also thinks that she noticed that her coughing was worse when she was this. She was changed to olmesartan instead.   She completed her last course of antibiotics (azithromycin) and  she has done well. She has not needed antibiotics since that time. She has a had no sinus infection or ear infections.   Asthma/Respiratory Symptom History: She remains on Breztri. She is still getting it with the card and doing well with that. She has not been using her rescue inhaler much at all, maybe a couple of times. She is sleeping well at night. She uses Ambien only as needed.  Allergic Rhinitis Symptom  History: She is using the nose sprays as needed. She has the cetirizine that she uses daily.   GERD Symptom History: She is on the Pepcid BID.   This is working well. She does not see GI at all for her symptoms.   Otherwise, there have been no changes to her past medical history, surgical history, family history, or social history.    Review of Systems  Constitutional: Negative.  Negative for chills, fever, malaise/fatigue and weight loss.  HENT: Negative.  Negative for congestion, ear discharge, ear pain, sinus pain and sore throat.   Eyes:  Negative for pain, discharge and redness.  Respiratory:  Negative for cough, sputum production, shortness of breath and wheezing.   Cardiovascular: Negative.  Negative for chest pain and palpitations.  Gastrointestinal:  Negative for abdominal pain, constipation, diarrhea, heartburn, nausea and vomiting.  Skin: Negative.  Negative for itching and rash.  Neurological:  Negative for dizziness and headaches.  Endo/Heme/Allergies:  Negative for environmental allergies. Does not bruise/bleed easily.      Objective:   Blood pressure 130/80, pulse 94, temperature 98.1 F (36.7 C), temperature source Temporal, resp. rate 17, last menstrual period 08/15/2013, SpO2 96 %. There is no height or weight on file to calculate BMI.    Physical Exam Vitals reviewed.  Constitutional:      Appearance: She is well-developed.     Comments: Talkative.  Pleasant.  Well-appearing.  HENT:     Head: Normocephalic and atraumatic.     Right Ear: Tympanic membrane, ear canal and external ear normal.     Left Ear: Tympanic membrane, ear canal and external ear normal.     Nose: No nasal deformity, septal deviation, mucosal edema or rhinorrhea.     Right Turbinates: Enlarged, swollen and pale.     Left Turbinates: Enlarged, swollen and pale.     Right Sinus: No maxillary sinus tenderness or frontal sinus tenderness.     Left Sinus: No maxillary sinus tenderness or  frontal sinus tenderness.     Comments: No nasal polyps noted.     Mouth/Throat:     Lips: Pink.     Mouth: Mucous membranes are moist. Mucous membranes are not pale and not dry.     Pharynx: Uvula midline.     Comments: No cobblestoning noted today. Eyes:     General: Lids are normal. No allergic shiner.       Right eye: No discharge.        Left eye: No discharge.     Conjunctiva/sclera: Conjunctivae normal.     Right eye: Right conjunctiva is not injected. No chemosis.    Left eye: Left conjunctiva is not injected. No chemosis.    Pupils: Pupils are equal, round, and reactive to light.  Cardiovascular:     Rate and Rhythm: Normal rate and regular rhythm.     Heart sounds: Normal heart sounds.  Pulmonary:     Effort: Pulmonary effort is normal. No tachypnea, accessory muscle usage or respiratory distress.     Breath sounds: Normal  breath sounds. No wheezing, rhonchi or rales.     Comments: Moving air well in all lung fields. No increased work of breathing noted.  Chest:     Chest wall: No tenderness.  Lymphadenopathy:     Cervical: No cervical adenopathy.  Skin:    General: Skin is warm.     Capillary Refill: Capillary refill takes less than 2 seconds.     Coloration: Skin is not pale.     Findings: No abrasion, erythema, petechiae or rash. Rash is not papular, urticarial or vesicular.     Comments: No eczematous or urticarial lesions noted.   Neurological:     Mental Status: She is alert.  Psychiatric:        Behavior: Behavior is cooperative.     Diagnostic studies:    Spirometry: results abnormal (FEV1: 1.35/61%, FVC: 1.84/66%, FEV1/FVC: 73%).    Spirometry consistent with possible restrictive disease.  Overall, this is better than last time I saw him this.  Looking back to when I first met her several years ago, this is essentially her baseline.    Allergy Studies: none        Salvatore Marvel, MD  [facility]

## 2021-10-21 DIAGNOSIS — E039 Hypothyroidism, unspecified: Secondary | ICD-10-CM | POA: Diagnosis not present

## 2021-10-21 DIAGNOSIS — R7301 Impaired fasting glucose: Secondary | ICD-10-CM | POA: Diagnosis not present

## 2021-10-28 DIAGNOSIS — G4700 Insomnia, unspecified: Secondary | ICD-10-CM | POA: Diagnosis not present

## 2021-10-28 DIAGNOSIS — E039 Hypothyroidism, unspecified: Secondary | ICD-10-CM | POA: Diagnosis not present

## 2021-10-28 DIAGNOSIS — I10 Essential (primary) hypertension: Secondary | ICD-10-CM | POA: Diagnosis not present

## 2021-10-28 DIAGNOSIS — J4540 Moderate persistent asthma, uncomplicated: Secondary | ICD-10-CM | POA: Diagnosis not present

## 2021-11-15 DIAGNOSIS — H04123 Dry eye syndrome of bilateral lacrimal glands: Secondary | ICD-10-CM | POA: Diagnosis not present

## 2021-12-05 DIAGNOSIS — R0683 Snoring: Secondary | ICD-10-CM | POA: Diagnosis not present

## 2021-12-05 DIAGNOSIS — R5383 Other fatigue: Secondary | ICD-10-CM | POA: Diagnosis not present

## 2021-12-12 DIAGNOSIS — G4719 Other hypersomnia: Secondary | ICD-10-CM | POA: Diagnosis not present

## 2021-12-13 DIAGNOSIS — G4719 Other hypersomnia: Secondary | ICD-10-CM | POA: Diagnosis not present

## 2021-12-16 DIAGNOSIS — G4733 Obstructive sleep apnea (adult) (pediatric): Secondary | ICD-10-CM | POA: Diagnosis not present

## 2022-01-09 DIAGNOSIS — G4733 Obstructive sleep apnea (adult) (pediatric): Secondary | ICD-10-CM | POA: Diagnosis not present

## 2022-01-27 DIAGNOSIS — R0683 Snoring: Secondary | ICD-10-CM | POA: Diagnosis not present

## 2022-01-27 DIAGNOSIS — R5383 Other fatigue: Secondary | ICD-10-CM | POA: Diagnosis not present

## 2022-01-27 DIAGNOSIS — J4540 Moderate persistent asthma, uncomplicated: Secondary | ICD-10-CM | POA: Diagnosis not present

## 2022-01-27 DIAGNOSIS — Z23 Encounter for immunization: Secondary | ICD-10-CM | POA: Diagnosis not present

## 2022-01-27 DIAGNOSIS — G4700 Insomnia, unspecified: Secondary | ICD-10-CM | POA: Diagnosis not present

## 2022-02-01 ENCOUNTER — Ambulatory Visit: Payer: BC Managed Care – PPO | Admitting: Allergy & Immunology

## 2022-02-01 ENCOUNTER — Encounter: Payer: Self-pay | Admitting: Allergy & Immunology

## 2022-02-01 VITALS — BP 128/78 | HR 75 | Temp 100.3°F | Resp 18 | Ht 59.0 in | Wt 195.5 lb

## 2022-02-01 DIAGNOSIS — J3089 Other allergic rhinitis: Secondary | ICD-10-CM

## 2022-02-01 DIAGNOSIS — J4540 Moderate persistent asthma, uncomplicated: Secondary | ICD-10-CM

## 2022-02-01 DIAGNOSIS — K219 Gastro-esophageal reflux disease without esophagitis: Secondary | ICD-10-CM | POA: Diagnosis not present

## 2022-02-01 DIAGNOSIS — J302 Other seasonal allergic rhinitis: Secondary | ICD-10-CM

## 2022-02-01 NOTE — Patient Instructions (Addendum)
`  1. Moderate persistent asthma, uncomplicated - Lung function looked very stable today.  - I am ok with holding off on further workup at this point in time.  - Daily controller medication(s): Breztri two puffs twice daily  - Prior to physical activity: ProAir 2 puffs 10-15 minutes before physical activity. - Rescue medications: ProAir 4 puffs every 4-6 hours as needed - Asthma control goals:  * Full participation in all desired activities (may need albuterol before activity) * Albuterol use two time or less a week on average (not counting use with activity) * Cough interfering with sleep two time or less a month * Oral steroids no more than once a year * No hospitalizations  2. Perennial allergic rhinitis (grass, trees, mold, dust mite, and cat) - Continue with cetirizine '10mg'$  daily - Continue with Nasacort one spray per nostril daily AS NEEDED. - Continue with Astelin one spray per nostril twice daily AS NEEDED.  3. Concern for uncontrolled GERD - Continue with Pepcid twice daily.  4. Return in about 6 months (around 08/03/2022).    Please inform us of any Emergency Department visits, hospitalizations, or changes in symptoms. Call us before going to the ED for breathing or allergy symptoms since we might be able to fit you in for a sick visit. Feel free to contact us anytime with any questions, problems, or concerns.  It was a pleasure to see you again today!  Websites that have reliable patient information: 1. American Academy of Asthma, Allergy, and Immunology: www.aaaai.org 2. Food Allergy Research and Education (FARE): foodallergy.org 3. Mothers of Asthmatics: http://www.asthmacommunitynetwork.org 4. American College of Allergy, Asthma, and Immunology: www.acaai.org   COVID-19 Vaccine Information can be found at: ShippingScam.co.uk For questions related to vaccine distribution or appointments, please email  [email or web address] or call [phone].   We realize that you might be concerned about having an allergic reaction to the COVID19 vaccines. To help with that concern, WE ARE OFFERING THE COVID19 VACCINES IN OUR OFFICE! Ask the front desk for dates!     "Like" [place] on Facebook and Instagram for our latest updates!      A healthy democracy works best when [employer] participate! Make sure you are registered to vote! If you have moved or changed any of your contact information, you will need to get this updated before voting!  In some cases, you MAY be able to register to vote online: [email or web address]

## 2022-02-01 NOTE — Progress Notes (Signed)
FOLLOW UP  Date of Service/Encounter:  02/01/22   Assessment:    Moderate persistent asthma, uncomplicated - with worsening control since the last visit   Seasonal and perennial allergic rhinitis (grass, trees, mold, dust mite, and cat)   Hypothyroidism   Osteopenia - incidental finding on X-ray (treated with calcium and vitamin D supplementation)   GERD - recommended continuing with Pepcid today   Rash - improved with cessation of amlodipine     Plan/Recommendations:   `1. Moderate persistent asthma, uncomplicated - Lung function looked very stable today.  - I am ok with holding off on further workup at this point in time.  - Daily controller medication(s): Breztri two puffs twice daily  - Prior to physical activity: ProAir 2 puffs 10-15 minutes before physical activity. - Rescue medications: ProAir 4 puffs every 4-6 hours as needed - Asthma control goals:  * Full participation in all desired activities (may need albuterol before activity) * Albuterol use two time or less a week on average (not counting use with activity) * Cough interfering with sleep two time or less a month * Oral steroids no more than once a year * No hospitalizations  2. Perennial allergic rhinitis (grass, trees, mold, dust mite, and cat) - Continue with cetirizine '10mg'$  daily - Continue with Nasacort one spray per nostril daily AS NEEDED. - Continue with Astelin one spray per nostril twice daily AS NEEDED.  3. Concern for uncontrolled GERD - Continue with Pepcid twice daily.  4. Return in about 6 months (around 08/03/2022).   Subjective:   Valerie Henderson is a 56 y.o. female presenting today for follow up of  Chief Complaint  Patient presents with   Asthma    Asthma has been about the same. Has only used albuterol maybe 1-2x since her last visit    Allergies    Has been having to clear her throat some     Valerie Henderson has a history of the following: Patient Active Problem List    Diagnosis Date Noted   Gastroesophageal reflux disease 03/14/2021   Encounter for screening fecal occult blood testing 08/23/2020   Encounter for gynecological examination with Papanicolaou smear of cervix 02/14/2019   Vaginal dryness 02/01/2017   Seasonal and perennial allergic rhinitis 12/26/2016   Moderate persistent asthma without complication 99/83/3825   Elevated BP 06/10/2015   Post-operative state 10/22/2013   Endometriosis of pelvis 09/16/2013   Cervical os stenosis 08/06/2013   Menorrhagia 08/06/2013   Screening for colorectal cancer 01/14/2013   Pre-op testing 12/25/2012   Fibromyalgia 12/16/2012   Irregular bleeding 12/05/2012   Hypothyroid 12/05/2012   Constipation 12/05/2012    History obtained from: chart review and patient.  Valerie Henderson is a 56 y.o. female presenting for a follow up visit.  She was last seen in May 2023.  At that time, lung function looked very stable.  We continued with Breztri 2 puffs twice daily as well as albuterol as needed.  For the allergic rhinitis, would continue with cetirizine as well as Nasacort and Astelin.  She was using nasal sprays as needed.  For her history of reflux, will send in a prescription for Pepcid twice daily.  Since last visit, she has done well.   Asthma/Respiratory Symptom History: She is on Breztri two puffs BID. She has been doing well with that. The copay card has been working well and she is not paying anything for it at all. She reaches for her albuterol very rarely. They  have not bothered her much at all; she does avoid the chemicals that bother her. She does not wake up coughing, but she was recently diagnosed with sleep apnea. She did a home sleep test. She is trying a mouth guard. The mouth guard was supposed to be $5800 but her insurance covered it. It snaps in like a retainer.  She does report that she might feel that she is well-rested in the morning.   Allergic Rhinitis Symptom History: She remains on her nose sprays as  needed. She is on cetirizine daily and this is working very well. Her last sinus infection was years ago.  She does not have postnasal drip or sneezing. She has some throat clearing daily even with her reflux medications.   GERD Symptom History: She remains on the Pepcid BID.  This seems to be controlling her symptoms effectively. She has not needed Tums   She had a red rash from the amlodipine and changed to olmesartan. This seems to be working well.   Otherwise, there have been no changes to her past medical history, surgical history, family history, or social history.    Review of Systems  Constitutional: Negative.  Negative for chills, fever, malaise/fatigue and weight loss.  HENT: Negative.  Negative for congestion, ear discharge, ear pain and sinus pain.   Eyes:  Negative for pain, discharge and redness.  Respiratory:  Negative for cough, sputum production, shortness of breath and wheezing.   Cardiovascular: Negative.  Negative for chest pain and palpitations.  Gastrointestinal:  Negative for abdominal pain, constipation, diarrhea, heartburn, nausea and vomiting.  Skin: Negative.  Negative for itching and rash.  Neurological:  Negative for dizziness and headaches.  Endo/Heme/Allergies:  Negative for environmental allergies. Does not bruise/bleed easily.       Objective:   Blood pressure 128/78, pulse 75, temperature 100.3 F (37.9 C), resp. rate 18, height '4\' 11"'$  (1.499 m), weight 195 lb 8 oz (88.7 kg), last menstrual period 08/15/2013, SpO2 97 %. Body mass index is 39.49 kg/m.    Physical Exam Vitals reviewed.  Constitutional:      Appearance: She is well-developed.     Comments: Lovely and talkative.   HENT:     Head: Normocephalic and atraumatic.     Right Ear: Tympanic membrane, ear canal and external ear normal.     Left Ear: Tympanic membrane, ear canal and external ear normal.     Nose: No nasal deformity, septal deviation, mucosal edema or rhinorrhea.      Right Turbinates: Enlarged, swollen and pale.     Left Turbinates: Enlarged, swollen and pale.     Right Sinus: No maxillary sinus tenderness or frontal sinus tenderness.     Left Sinus: No maxillary sinus tenderness or frontal sinus tenderness.     Comments: No nasal polyps noted.     Mouth/Throat:     Lips: Pink.     Mouth: Mucous membranes are moist. Mucous membranes are not pale and not dry.     Pharynx: Uvula midline.     Comments: No cobblestoning noted today. Eyes:     General: Lids are normal. No allergic shiner.       Right eye: No discharge.        Left eye: No discharge.     Conjunctiva/sclera: Conjunctivae normal.     Right eye: Right conjunctiva is not injected. No chemosis.    Left eye: Left conjunctiva is not injected. No chemosis.    Pupils: Pupils are equal,  round, and reactive to light.  Cardiovascular:     Rate and Rhythm: Normal rate and regular rhythm.     Heart sounds: Normal heart sounds.  Pulmonary:     Effort: Pulmonary effort is normal. No tachypnea, accessory muscle usage or respiratory distress.     Breath sounds: Normal breath sounds. No wheezing, rhonchi or rales.     Comments: Moving air well in all lung fields. No increased work of breathing noted.  Chest:     Chest wall: No tenderness.  Lymphadenopathy:     Cervical: No cervical adenopathy.  Skin:    General: Skin is warm.     Capillary Refill: Capillary refill takes less than 2 seconds.     Coloration: Skin is not pale.     Findings: No abrasion, erythema, petechiae or rash. Rash is not papular, urticarial or vesicular.     Comments: No eczematous or urticarial lesions noted.   Neurological:     Mental Status: She is alert.  Psychiatric:        Behavior: Behavior is cooperative.      Diagnostic studies:    Spirometry: results abnormal (FEV1: 1.31/59%, FVC: 1.75/63%, FEV1/FVC: 75%).    Overall this is stable compared to previous spirometric findings.    Allergy Studies:  none        Salvatore Marvel, MD  [facility]

## 2022-02-06 DIAGNOSIS — G4733 Obstructive sleep apnea (adult) (pediatric): Secondary | ICD-10-CM | POA: Diagnosis not present

## 2022-02-07 DIAGNOSIS — G4733 Obstructive sleep apnea (adult) (pediatric): Secondary | ICD-10-CM | POA: Diagnosis not present

## 2022-02-10 ENCOUNTER — Other Ambulatory Visit (HOSPITAL_COMMUNITY): Payer: Self-pay | Admitting: Adult Health

## 2022-02-10 DIAGNOSIS — Z1231 Encounter for screening mammogram for malignant neoplasm of breast: Secondary | ICD-10-CM

## 2022-02-22 DIAGNOSIS — H04123 Dry eye syndrome of bilateral lacrimal glands: Secondary | ICD-10-CM | POA: Diagnosis not present

## 2022-03-02 ENCOUNTER — Ambulatory Visit (HOSPITAL_COMMUNITY)
Admission: RE | Admit: 2022-03-02 | Discharge: 2022-03-02 | Disposition: A | Discharge status: Home / Self Care | Payer: BC Managed Care – PPO | Source: Ambulatory Visit | Attending: Adult Health | Admitting: Adult Health

## 2022-03-02 DIAGNOSIS — Z1231 Encounter for screening mammogram for malignant neoplasm of breast: Secondary | ICD-10-CM | POA: Diagnosis not present

## 2022-03-14 ENCOUNTER — Other Ambulatory Visit: Payer: Self-pay | Admitting: *Deleted

## 2022-03-14 MED ORDER — BREZTRI AEROSPHERE 160-9-4.8 MCG/ACT IN AERO: 2.0000 | INHALATION_SPRAY | Freq: Two times a day (BID) | RESPIRATORY_TRACT | 5 refills | Status: DC

## 2022-04-20 DIAGNOSIS — E039 Hypothyroidism, unspecified: Secondary | ICD-10-CM | POA: Diagnosis not present

## 2022-04-20 DIAGNOSIS — R7301 Impaired fasting glucose: Secondary | ICD-10-CM | POA: Diagnosis not present

## 2022-04-28 DIAGNOSIS — R0683 Snoring: Secondary | ICD-10-CM | POA: Diagnosis not present

## 2022-04-28 DIAGNOSIS — J302 Other seasonal allergic rhinitis: Secondary | ICD-10-CM | POA: Diagnosis not present

## 2022-04-28 DIAGNOSIS — R5383 Other fatigue: Secondary | ICD-10-CM | POA: Diagnosis not present

## 2022-04-28 DIAGNOSIS — K219 Gastro-esophageal reflux disease without esophagitis: Secondary | ICD-10-CM | POA: Diagnosis not present

## 2022-04-28 DIAGNOSIS — J4540 Moderate persistent asthma, uncomplicated: Secondary | ICD-10-CM | POA: Diagnosis not present

## 2022-04-28 DIAGNOSIS — E039 Hypothyroidism, unspecified: Secondary | ICD-10-CM | POA: Diagnosis not present

## 2022-04-28 DIAGNOSIS — G43909 Migraine, unspecified, not intractable, without status migrainosus: Secondary | ICD-10-CM | POA: Diagnosis not present

## 2022-05-01 HISTORY — PX: SPIDER VEIN INJECTION: SHX783

## 2022-07-12 ENCOUNTER — Telehealth: Payer: Self-pay | Admitting: Allergy & Immunology

## 2022-07-12 MED ORDER — ALBUTEROL SULFATE HFA 108 (90 BASE) MCG/ACT IN AERS: INHALATION_SPRAY | RESPIRATORY_TRACT | 1 refills | Status: DC

## 2022-07-12 NOTE — Telephone Encounter (Signed)
Patient called to request a refill of albuterol. Confirmed pharmacy and sent it in.

## 2022-08-15 ENCOUNTER — Other Ambulatory Visit: Payer: Self-pay | Admitting: Allergy & Immunology

## 2022-08-16 ENCOUNTER — Other Ambulatory Visit: Payer: Self-pay

## 2022-08-16 ENCOUNTER — Ambulatory Visit: Payer: BC Managed Care – PPO | Admitting: Allergy & Immunology

## 2022-08-16 ENCOUNTER — Encounter: Payer: Self-pay | Admitting: Allergy & Immunology

## 2022-08-16 VITALS — BP 126/72 | HR 81 | Temp 98.1°F | Resp 20 | Ht 59.0 in | Wt 201.0 lb

## 2022-08-16 DIAGNOSIS — J3089 Other allergic rhinitis: Secondary | ICD-10-CM | POA: Diagnosis not present

## 2022-08-16 DIAGNOSIS — J4540 Moderate persistent asthma, uncomplicated: Secondary | ICD-10-CM

## 2022-08-16 DIAGNOSIS — K219 Gastro-esophageal reflux disease without esophagitis: Secondary | ICD-10-CM | POA: Diagnosis not present

## 2022-08-16 DIAGNOSIS — M8588 Other specified disorders of bone density and structure, other site: Secondary | ICD-10-CM

## 2022-08-16 DIAGNOSIS — J302 Other seasonal allergic rhinitis: Secondary | ICD-10-CM

## 2022-08-16 MED ORDER — BREZTRI AEROSPHERE 160-9-4.8 MCG/ACT IN AERO: 2.0000 | INHALATION_SPRAY | Freq: Two times a day (BID) | RESPIRATORY_TRACT | 1 refills | Status: DC

## 2022-08-16 MED ORDER — FAMOTIDINE 20 MG PO TABS: 20.0000 mg | ORAL_TABLET | Freq: Two times a day (BID) | ORAL | 1 refills | Status: DC

## 2022-08-16 MED ORDER — MONTELUKAST SODIUM 10 MG PO TABS: 10.0000 mg | ORAL_TABLET | Freq: Every day | ORAL | 1 refills | Status: DC

## 2022-08-16 NOTE — Progress Notes (Signed)
FOLLOW UP  Date of Service/Encounter:  08/16/22   Assessment:   Moderate persistent asthma, uncomplicated - with worsening control since the last visit   Seasonal and perennial allergic rhinitis (grass, trees, mold, dust mite, and cat)   Hypothyroidism   Osteopenia - incidental finding on X-ray (treated with calcium and vitamin D supplementation)  Maternal history of osteopenia   GERD - recommended continuing with Pepcid today   Rash - improved with cessation of amlodipine   Plan/Recommendations:   `1. Moderate persistent asthma, uncomplicated - Lung function looked stable today (not your best, but not the worst either!).  - Daily controller medication(s): Breztri two puffs twice daily  - Prior to physical activity: ProAir 2 puffs 10-15 minutes before physical activity. - Rescue medications: ProAir 4 puffs every 4-6 hours as needed - Asthma control goals:  * Full participation in all desired activities (may need albuterol before activity) * Albuterol use two time or less a week on average (not counting use with activity) * Cough interfering with sleep two time or less a month * Oral steroids no more than once a year * No hospitalizations  2. Perennial allergic rhinitis (grass, trees, mold, dust mite, and cat) - Continue with cetirizine  daily - Continue with Nasacort one spray per nostril daily AS NEEDED. - Continue with Astelin one spray per nostril twice daily AS NEEDED.  3. Concern for uncontrolled GERD - Continue with Pepcid twice daily.  4. Osteopenia - I ordered a DEXA scan to look for bone mineralization.  - This can be done at [facility].   5. Return in about 6 months (around 02/15/2023).    Subjective:   Valerie Henderson is a 57 y.o. female presenting today for follow up of  Chief Complaint  Patient presents with   Follow-up    Pt states her asthma has been going good so issues so far.    Valerie Henderson has a history of the following: Patient  Active Problem List   Diagnosis Date Noted   Gastroesophageal reflux disease 03/14/2021   Encounter for screening fecal occult blood testing 08/23/2020   Encounter for gynecological examination with Papanicolaou smear of cervix 02/14/2019   Vaginal dryness 02/01/2017   Seasonal and perennial allergic rhinitis 12/26/2016   Moderate persistent asthma without complication 12/26/2016   Elevated BP 06/10/2015   Post-operative state 10/22/2013   Endometriosis of pelvis 09/16/2013   Cervical os stenosis 08/06/2013   Menorrhagia 08/06/2013   Screening for colorectal cancer 01/14/2013   Pre-op testing 12/25/2012   Fibromyalgia 12/16/2012   Irregular bleeding 12/05/2012   Hypothyroid 12/05/2012   Constipation 12/05/2012    History obtained from: chart review and patient.  Valerie Henderson is a 57 y.o. female presenting for a follow up visit.  She was last seen in October 2023.  At that time, her lung function looks stable.  We continue with Breztri 2 puffs twice daily as well as albuterol as needed.  For her right wrist, we continue with cetirizine as well as Nasacort and Astelin.  She was also doing well on Pepcid twice daily for her reflux.  Asthma/Respiratory Symptom History: She is getting Breztri for $0 monthly with the copay card.  She feels that the [employer] is working as well as [employer]. She is overall doing very well with her symptoms. She is sleeping well at night for the most part. She takes a sleeping pill, but she does not get enough sleep. She averages 5-6 hours of  sleep. She does feel rested in the morning. She has a mouth guard. She has had that for a period of a year or so. She saw someone in [place] for this and this is where she was fitted for the sleep guard. Valerie Henderson's asthma has been well controlled. She has not required rescue medication, experienced nocturnal awakenings due to lower respiratory symptoms, nor have activities of daily living been limited. She has required no Emergency  Department or Urgent Care visits for her asthma. She has required zero courses of systemic steroids for asthma exacerbations since the last visit. ACT score today is 20, indicating excellent asthma symptom control.   Allergic Rhinitis Symptom History: She uses the nose sprays intermittently. She does not like the nose sprays. She is on the cetirizine daily. She never misses this. She has switched between cetirizine and fexofenadine.  She has not been on antibiotics at all. She is doing very well overall.   GERD Symptom History: She remains on the Pepcid twice dailly.  This is controlling her symptoms effectively without a problem.   Otherwise, there have been no changes to her past medical history, surgical history, family history, or social history.    Review of Systems  Constitutional: Negative.  Negative for chills, fever, malaise/fatigue and weight loss.  HENT: Negative.  Negative for congestion, ear discharge, ear pain and sinus pain.   Eyes:  Negative for pain, discharge and redness.  Respiratory:  Negative for cough, sputum production, shortness of breath and wheezing.   Cardiovascular: Negative.  Negative for chest pain and palpitations.  Gastrointestinal:  Negative for abdominal pain, constipation, diarrhea, heartburn, nausea and vomiting.  Skin: Negative.  Negative for itching and rash.  Neurological:  Negative for dizziness and headaches.  Endo/Heme/Allergies:  Negative for environmental allergies. Does not bruise/bleed easily.       Objective:   Blood pressure 126/72, pulse 81, temperature 98.1 F (36.7 C), resp. rate 20, height 4\' 11"  (1.499 m), weight 201 lb (91.2 kg), last menstrual period 08/15/2013, SpO2 96 %. Body mass index is 40.6 kg/m.    Physical Exam Vitals reviewed.  Constitutional:      Appearance: She is well-developed.     Comments: Lovely and talkative.   HENT:     Head: Normocephalic and atraumatic.     Right Ear: Tympanic membrane, ear canal and  external ear normal.     Left Ear: Tympanic membrane, ear canal and external ear normal.     Nose: No nasal deformity, septal deviation, mucosal edema or rhinorrhea.     Right Turbinates: Enlarged, swollen and pale.     Left Turbinates: Enlarged, swollen and pale.     Right Sinus: No maxillary sinus tenderness or frontal sinus tenderness.     Left Sinus: No maxillary sinus tenderness or frontal sinus tenderness.     Comments: No nasal polyps noted.     Mouth/Throat:     Lips: Pink.     Mouth: Mucous membranes are moist. Mucous membranes are not pale and not dry.     Pharynx: Uvula midline.     Comments: No cobblestoning noted today. Eyes:     General: Lids are normal. No allergic shiner.       Right eye: No discharge.        Left eye: No discharge.     Conjunctiva/sclera: Conjunctivae normal.     Right eye: Right conjunctiva is not injected. No chemosis.    Left eye: Left conjunctiva is not injected. No  chemosis.    Pupils: Pupils are equal, round, and reactive to light.  Cardiovascular:     Rate and Rhythm: Normal rate and regular rhythm.     Heart sounds: Normal heart sounds.  Pulmonary:     Effort: Pulmonary effort is normal. No tachypnea, accessory muscle usage or respiratory distress.     Breath sounds: Normal breath sounds. No wheezing, rhonchi or rales.     Comments: Moving air well in all lung fields. No increased work of breathing noted.  Chest:     Chest wall: No tenderness.  Lymphadenopathy:     Cervical: No cervical adenopathy.  Skin:    General: Skin is warm.     Capillary Refill: Capillary refill takes less than 2 seconds.     Coloration: Skin is not pale.     Findings: No abrasion, erythema, petechiae or rash. Rash is not papular, urticarial or vesicular.     Comments: No eczematous or urticarial lesions noted.   Neurological:     Mental Status: She is alert.  Psychiatric:        Behavior: Behavior is cooperative.      Diagnostic studies:    Spirometry:  results abnormal (FEV1: 1.19/54%, FVC: 1.71/62%, FEV1/FVC: 70%).    Spirometry consistent with possible restrictive disease.   Allergy Studies: none       Malachi Bonds, MD  [facility]

## 2022-08-16 NOTE — Patient Instructions (Addendum)
`  1. Moderate persistent asthma, uncomplicated - Lung function looked stable today (not your best, but not the worst either!).  - Daily controller medication(s): Breztri two puffs twice daily  - Prior to physical activity: ProAir 2 puffs 10-15 minutes before physical activity. - Rescue medications: ProAir 4 puffs every 4-6 hours as needed - Asthma control goals:  * Full participation in all desired activities (may need albuterol before activity) * Albuterol use two time or less a week on average (not counting use with activity) * Cough interfering with sleep two time or less a month * Oral steroids no more than once a year * No hospitalizations  2. Perennial allergic rhinitis (grass, trees, mold, dust mite, and cat) - Continue with cetirizine 10mg  daily - Continue with Nasacort one spray per nostril daily AS NEEDED. - Continue with Astelin one spray per nostril twice daily AS NEEDED.  3. Concern for uncontrolled GERD - Continue with Pepcid twice daily.  4. Osteopenia - I ordered a DEXA scan to look for bone mineralization.  - This can be done at [facility]   5. Return in about 6 months (around 02/15/2023).    Please inform us of any Emergency Department visits, hospitalizations, or changes in symptoms. Call us before going to the ED for breathing or allergy symptoms since we might be able to fit you in for a sick visit. Feel free to contact us anytime with any questions, problems, or concerns.  It was a pleasure to see you again today!  Websites that have reliable patient information: 1. American Academy of Asthma, Allergy, and Immunology: www.aaaai.org 2. Food Allergy Research and Education (FARE): foodallergy.org 3. Mothers of Asthmatics: http://www.asthmacommunitynetwork.org 4. American College of Allergy, Asthma, and Immunology: www.acaai.org   COVID-19 Vaccine Information can be found at: PodExchange.nl For  questions related to vaccine distribution or appointments, please email [email or web address] or call [phone].   We realize that you might be concerned about having an allergic reaction to the COVID19 vaccines. To help with that concern, WE ARE OFFERING THE COVID19 VACCINES IN OUR OFFICE! Ask the front desk for dates!     "Like" [place] on Facebook and Instagram for our latest updates!      A healthy democracy works best when [employer] participate! Make sure you are registered to vote! If you have moved or changed any of your contact information, you will need to get this updated before voting!  In some cases, you MAY be able to register to vote online: [email or web address]

## 2022-08-18 ENCOUNTER — Encounter: Payer: Self-pay | Admitting: Allergy & Immunology

## 2022-09-14 ENCOUNTER — Other Ambulatory Visit: Payer: Self-pay | Admitting: Allergy & Immunology

## 2022-10-19 ENCOUNTER — Telehealth: Payer: Self-pay | Admitting: Allergy & Immunology

## 2022-10-19 DIAGNOSIS — I10 Essential (primary) hypertension: Secondary | ICD-10-CM | POA: Diagnosis not present

## 2022-10-19 DIAGNOSIS — R7303 Prediabetes: Secondary | ICD-10-CM | POA: Diagnosis not present

## 2022-10-19 DIAGNOSIS — E039 Hypothyroidism, unspecified: Secondary | ICD-10-CM | POA: Diagnosis not present

## 2022-10-19 NOTE — Telephone Encounter (Signed)
Patient states she is supposed to have a bone scan done but has not heard from the hospital. Patient is requesting someone look into this.   Best contact number:[phone]

## 2022-10-19 NOTE — Telephone Encounter (Signed)
Called patient's insurance - dept for PA was closed.  I will try to call PA dept on Friday, 10/19/22, to see if PA is needed.

## 2022-10-25 NOTE — Telephone Encounter (Signed)
Called and spoke with [facility] and got the patient scheduled on July 18th at 1:00 pm. Advised to arrive 15 minutes early, stop taking any medications with calcium 48 hours prior, bring her medication list with her, and wear comfortable 2 piece clothing. Patient stated that Thursdays are usually not the best but that she will make it work and verbalized understanding.

## 2022-10-25 NOTE — Addendum Note (Signed)
Addended by: Dollene Cleveland R on: 10/25/2022 12:01 PM   Modules accepted: Orders

## 2022-10-26 ENCOUNTER — Other Ambulatory Visit: Payer: Self-pay | Admitting: Allergy & Immunology

## 2022-10-31 DIAGNOSIS — R0683 Snoring: Secondary | ICD-10-CM | POA: Diagnosis not present

## 2022-10-31 DIAGNOSIS — I10 Essential (primary) hypertension: Secondary | ICD-10-CM | POA: Diagnosis not present

## 2022-10-31 DIAGNOSIS — J302 Other seasonal allergic rhinitis: Secondary | ICD-10-CM | POA: Diagnosis not present

## 2022-10-31 DIAGNOSIS — R5383 Other fatigue: Secondary | ICD-10-CM | POA: Diagnosis not present

## 2022-10-31 DIAGNOSIS — J4540 Moderate persistent asthma, uncomplicated: Secondary | ICD-10-CM | POA: Diagnosis not present

## 2022-11-07 DIAGNOSIS — M79662 Pain in left lower leg: Secondary | ICD-10-CM | POA: Diagnosis not present

## 2022-11-07 DIAGNOSIS — M79604 Pain in right leg: Secondary | ICD-10-CM | POA: Diagnosis not present

## 2022-11-07 DIAGNOSIS — I83891 Varicose veins of right lower extremities with other complications: Secondary | ICD-10-CM | POA: Diagnosis not present

## 2022-11-07 DIAGNOSIS — I87393 Chronic venous hypertension (idiopathic) with other complications of bilateral lower extremity: Secondary | ICD-10-CM | POA: Diagnosis not present

## 2022-11-07 DIAGNOSIS — M79661 Pain in right lower leg: Secondary | ICD-10-CM | POA: Diagnosis not present

## 2022-11-16 ENCOUNTER — Ambulatory Visit (HOSPITAL_COMMUNITY)
Admission: RE | Admit: 2022-11-16 | Discharge: 2022-11-16 | Disposition: A | Discharge status: Home / Self Care | Payer: BC Managed Care – PPO | Source: Ambulatory Visit | Attending: Allergy & Immunology | Admitting: Allergy & Immunology

## 2022-11-16 DIAGNOSIS — M8588 Other specified disorders of bone density and structure, other site: Secondary | ICD-10-CM | POA: Diagnosis not present

## 2022-11-16 DIAGNOSIS — Z0189 Encounter for other specified special examinations: Secondary | ICD-10-CM | POA: Diagnosis not present

## 2022-12-26 ENCOUNTER — Encounter: Payer: Self-pay | Admitting: Adult Health

## 2022-12-26 ENCOUNTER — Ambulatory Visit (INDEPENDENT_AMBULATORY_CARE_PROVIDER_SITE_OTHER): Payer: BC Managed Care – PPO | Admitting: Adult Health

## 2022-12-26 ENCOUNTER — Other Ambulatory Visit (HOSPITAL_COMMUNITY)
Admission: RE | Admit: 2022-12-26 | Discharge: 2022-12-26 | Disposition: A | Discharge status: Home / Self Care | Payer: BC Managed Care – PPO | Source: Ambulatory Visit | Attending: Adult Health | Admitting: Adult Health

## 2022-12-26 VITALS — BP 132/73 | HR 71 | Ht 59.75 in | Wt 206.5 lb

## 2022-12-26 DIAGNOSIS — Z90711 Acquired absence of uterus with remaining cervical stump: Secondary | ICD-10-CM | POA: Diagnosis not present

## 2022-12-26 DIAGNOSIS — Z01419 Encounter for gynecological examination (general) (routine) without abnormal findings: Secondary | ICD-10-CM | POA: Diagnosis not present

## 2022-12-26 DIAGNOSIS — Z1211 Encounter for screening for malignant neoplasm of colon: Secondary | ICD-10-CM | POA: Diagnosis not present

## 2022-12-26 DIAGNOSIS — I8393 Asymptomatic varicose veins of bilateral lower extremities: Secondary | ICD-10-CM | POA: Diagnosis not present

## 2022-12-26 LAB — HEMOCCULT GUIAC POC 1CARD (OFFICE): Fecal Occult Blood, POC: NEGATIVE

## 2022-12-26 NOTE — Progress Notes (Signed)
Patient ID: Valerie Henderson, female   DOB: 1965/10/23, 56 y.o.   MRN: [id number] History of Present Illness: Valerie Henderson is a 57 year old white female, married, sp [facility] , in for a well woman gyn exam and requests pap this year.  PCP is Dr Margo Aye.    Current Medications, Allergies, Past Medical History, Past Surgical History, Family History and Social History were reviewed in [employer] record.     Review of Systems: Patient denies any headaches, hearing loss, fatigue, blurred vision, shortness of breath, chest pain, abdominal pain, problems with bowel movements, urination, or intercourse. No joint pain or mood swings.     Physical Exam:BP 132/73 (BP Location: Left Arm, Patient Position: Sitting, Cuff Size: Normal)   Pulse 71   Ht 4' 11.75" (1.518 m)   Wt 206 lb 8 oz (93.7 kg)   LMP 08/15/2013 Comment: SCH  BMI 40.67 kg/m   General:  Well developed, well nourished, no acute distress Skin:  Warm and dry Neck:  Midline trachea, normal thyroid, good ROM, no lymphadenopathy Lungs; Clear to auscultation bilaterally Breast:  No dominant palpable mass, retraction, or nipple discharge Cardiovascular: Regular rate and rhythm Abdomen:  Soft, non tender, no hepatosplenomegaly Pelvic:  External genitalia is normal in appearance, no lesions.  The vagina is normal in appearance. Urethra has no lesions or masses. The cervix is smooth, pap with HR HPV genotyping performed. Uterus is surgically absent. No adnexal masses or tenderness noted.Bladder is non tender, no masses felt. Rectal: Good sphincter tone, no polyps, or hemorrhoids felt.  Hemoccult negative. Extremities/musculoskeletal:  No swelling, +BLE varicosities noted, no clubbing or cyanosis Psych:  No mood changes, alert and cooperative,seems happy AA is 2 Fall risk is low    08/23/2020    3:36 PM 02/14/2019   11:12 AM 02/01/2017   10:46 AM  Depression screen PHQ 2/9  Decreased Interest 0 0 0  Down, Depressed, Hopeless 0 0 0   PHQ - 2 Score 0 0 0  Altered sleeping 0    Tired, decreased energy 0    Change in appetite 0    Feeling bad or failure about yourself  0    Trouble concentrating 0    Moving slowly or fidgety/restless 0    Suicidal thoughts 0    PHQ-9 Score 0         08/23/2020    3:36 PM  GAD 7 : Generalized Anxiety Score  Nervous, Anxious, on Edge 0  Control/stop worrying 0  Worry too much - different things 0  Trouble relaxing 1  Restless 1  Easily annoyed or irritable 0  Afraid - awful might happen 0  Total GAD 7 Score 2      Upstream - 12/26/22 0843       Pregnancy Intention Screening   Does the patient want to become pregnant in the next year? N/A    Does the patient's partner want to become pregnant in the next year? N/A    Would the patient like to discuss contraceptive options today? N/A      Contraception Wrap Up   Current Method Female Sterilization   [facility]   End Method Female Sterilization   [facility]   Contraception Counseling Provided No            Examination chaperoned by Malachy Mood LPN   Impression and Plan: 1. Encounter for routine gynecological examination with Papanicolaou smear of cervix Pap sent Physical in 2 years here Labs with PCP  Mammogram was negative 03/02/22 Colonoscopy per GI, last one was virtual   2. Encounter for screening fecal occult blood testing Hemoccult was negtive  - POCT occult blood stool  3. S/P abdominal supracervical subtotal hysterectomy  4. Varicose veins of both lower extremities, unspecified whether complicated She has seen vein specialist, but needs to call for injections

## 2022-12-27 LAB — CYTOLOGY - PAP
Adequacy: ABSENT
Comment: NEGATIVE
Diagnosis: NEGATIVE
High risk HPV: NEGATIVE

## 2023-02-21 ENCOUNTER — Encounter: Payer: Self-pay | Admitting: Allergy & Immunology

## 2023-02-21 ENCOUNTER — Other Ambulatory Visit: Payer: Self-pay

## 2023-02-21 ENCOUNTER — Ambulatory Visit: Payer: BC Managed Care – PPO | Admitting: Allergy & Immunology

## 2023-02-21 VITALS — BP 118/72 | HR 86 | Temp 98.4°F | Resp 16 | Wt 208.5 lb

## 2023-02-21 DIAGNOSIS — J4540 Moderate persistent asthma, uncomplicated: Secondary | ICD-10-CM | POA: Diagnosis not present

## 2023-02-21 DIAGNOSIS — J3089 Other allergic rhinitis: Secondary | ICD-10-CM | POA: Diagnosis not present

## 2023-02-21 DIAGNOSIS — K219 Gastro-esophageal reflux disease without esophagitis: Secondary | ICD-10-CM

## 2023-02-21 MED ORDER — OMEPRAZOLE 40 MG PO CPDR: 40.0000 mg | DELAYED_RELEASE_CAPSULE | Freq: Every day | ORAL | 0 refills | Status: DC

## 2023-02-21 MED ORDER — MONTELUKAST SODIUM 10 MG PO TABS: 10.0000 mg | ORAL_TABLET | Freq: Every day | ORAL | 1 refills | Status: DC

## 2023-02-21 MED ORDER — FAMOTIDINE 20 MG PO TABS: 20.0000 mg | ORAL_TABLET | Freq: Two times a day (BID) | ORAL | 1 refills | Status: AC

## 2023-02-21 MED ORDER — BREZTRI AEROSPHERE 160-9-4.8 MCG/ACT IN AERO: 2.0000 | INHALATION_SPRAY | Freq: Two times a day (BID) | RESPIRATORY_TRACT | 1 refills | Status: DC

## 2023-02-21 NOTE — Patient Instructions (Addendum)
`  1. Moderate persistent asthma, uncomplicated - Lung function looked stable today and is a tad higher than that obtained at the last visit.  - We are going to add on omeprazole at night to see if this helps control your reflux a bit more.  - CALL us in two weeks with an update.  - We can think about restarting the injectable medication at some point.  - Daily controller medication(s): Breztri two puffs twice daily  - Prior to physical activity: ProAir 2 puffs 10-15 minutes before physical activity. - Rescue medications: ProAir 4 puffs every 4-6 hours as needed - Asthma control goals:  * Full participation in all desired activities (may need albuterol before activity) * Albuterol use two time or less a week on average (not counting use with activity) * Cough interfering with sleep two time or less a month * Oral steroids no more than once a year * No hospitalizations  2. Perennial allergic rhinitis (grass, trees, mold, dust mite, and cat) - Continue with cetirizine 10mg  daily - Continue with Nasacort one spray per nostril daily AS NEEDED. - Continue with Astelin one spray per nostril twice daily AS NEEDED.  3. Concern for uncontrolled GERD - Continue with Pepcid twice daily. - Add on omeprazole 40mg  at night.   4. Osteopenia - I ordered a DEXA scan to look for bone mineralization.  - This [facility] be [facility] at [facility].   5. Return in about 3 months (around 05/24/2023).    Please inform us of any Emergency Department visits, hospitalizations, or changes in symptoms. Call us before going to the ED for breathing or allergy symptoms since we might be able to fit you in for a sick visit. Feel free to contact us anytime with any questions, problems, or concerns.  It was a pleasure to see you again today! You are a delight!   Websites that have reliable patient information: 1. American Academy of Asthma, Allergy, and Immunology: www.aaaai.org 2. Food Allergy Research and Education (FARE):  foodallergy.org 3. Mothers of Asthmatics: http://www.asthmacommunitynetwork.org 4. American College of Allergy, Asthma, and Immunology: www.acaai.org   COVID-19 Vaccine Information can be found at: [email or web address].nl For questions related to vaccine distribution or appointments, please email [email or web address] or call [phone].   We realize that you might be concerned about having an allergic reaction to the COVID19 vaccines. To help with that concern, WE ARE OFFERING THE COVID19 VACCINES IN OUR OFFICE! Ask the front desk for dates!     "Like" [place] on Facebook and Instagram for our latest updates!      A healthy democracy works best when [employer] participate! Make sure you are registered to vote! If you have moved or changed any of your contact information, you will need to get this updated before voting!  In some cases, you MAY be able to register to vote online: [email or web address]

## 2023-02-21 NOTE — Progress Notes (Signed)
FOLLOW UP  Date of Service/Encounter:  02/21/23   Assessment:   Moderate persistent asthma, uncomplicated - with worsening control since the last visit   Seasonal and perennial allergic rhinitis (grass, trees, mold, dust mite, and cat)   Hypothyroidism   Osteopenia - incidental finding on X-ray (treated with calcium and vitamin D supplementation)   Maternal history of osteopenia   GERD - added on omeprazole in addition to her Pepcid today   Rash - improved with cessation of amlodipine  Plan/Recommendations:   `1. Moderate persistent asthma, uncomplicated - Lung function looked stable today and is a tad higher than that obtained at the last visit.  - We are going to add on omeprazole at night to see if this helps control your reflux a bit more.  - CALL us in two weeks with an update.  - We can think about restarting the injectable medication at some point.  - Daily controller medication(s): Breztri two puffs twice daily  - Prior to physical activity: ProAir 2 puffs 10-15 minutes before physical activity. - Rescue medications: ProAir 4 puffs every 4-6 hours as needed - Asthma control goals:  * Full participation in all desired activities (may need albuterol before activity) * Albuterol use two time or less a week on average (not counting use with activity) * Cough interfering with sleep two time or less a month * Oral steroids no more than once a year * No hospitalizations  2. Perennial allergic rhinitis (grass, trees, mold, dust mite, and cat) - Continue with cetirizine 10mg  daily - Continue with Nasacort one spray per nostril daily AS NEEDED. - Continue with Astelin one spray per nostril twice daily AS NEEDED.  3. Concern for uncontrolled GERD - Continue with Pepcid twice daily. - Add on omeprazole 40mg  at night.   4. Osteopenia - I ordered a DEXA scan to look for bone mineralization.  - This can be done at [facility].   5. Return in about 3 months (around  05/24/2023).   Subjective:   Valerie Henderson is a 57 y.o. female presenting today for follow up of  Chief Complaint  Patient presents with   Follow-up    Finds when she is talking she sounds hoarse and has some throat clearing.     Valerie Henderson has a history of the following: Patient Active Problem List   Diagnosis Date Noted   Varicose veins of both lower extremities 12/26/2022   S/P abdominal supracervical subtotal hysterectomy 12/26/2022   Encounter for routine gynecological examination with Papanicolaou smear of cervix 12/26/2022   Gastroesophageal reflux disease 03/14/2021   Encounter for screening fecal occult blood testing 08/23/2020   Encounter for gynecological examination with Papanicolaou smear of cervix 02/14/2019   Vaginal dryness 02/01/2017   Seasonal and perennial allergic rhinitis 12/26/2016   Moderate persistent asthma without complication 12/26/2016   Elevated BP 06/10/2015   Post-operative state 10/22/2013   Endometriosis of pelvis 09/16/2013   Cervical os stenosis 08/06/2013   Menorrhagia 08/06/2013   Screening for colorectal cancer 01/14/2013   Pre-op testing 12/25/2012   Fibromyalgia 12/16/2012   Irregular bleeding 12/05/2012   Hypothyroid 12/05/2012   Constipation 12/05/2012    History obtained from: chart review and patient.  Discussed the use of AI scribe software for clinical note transcription with the patient and/or guardian, who gave verbal consent to proceed.  Valerie Henderson is a 57 y.o. female presenting for a follow up visit.  She was last seen in April 2024.  At that time, her lung function was stable.  We continue with Breztri 2 puffs twice daily.  For her rhinitis, continue with cetirizine as well as the as needed use of Nasacort 1.  GERD was controlled with Pepcid twice daily.  We did order a DEXA scan because of her history of recurrent prednisone courses.  The bone scan looked excellent.  Since last visit, she has done very well.   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 These symptoms have been ongoing for a couple of months. The patient denies any associated cough, fever, or chest pain. They have a history of reflux and are currently on Pepcid twice daily, but the hoarseness persists. She has not been on a PPI to her knowledge. She is open to trying this.   The patient also has a history of blood pressure issues, including a rash from a previous blood pressure medication. They are unsure if the hoarseness could be related to their current blood pressure medication.  In addition to the hoarseness, the patient reports a sensation of phlegm in the throat, which they can clear. However, they deny any productive cough or expectoration. They also deny any postnasal drip symptoms.  The patient has a history of asthma and allergies, for which they take montelukast and a daily allergy pill. They were previously on Fasenra for asthma, but discontinued it as they did not notice any improvement in their numbers or symptoms. She thinks that she might need it in retrospect. However she has not needed prednisone. She does like to downplay her symptoms overall.   The patient's reflux symptoms do not seem to be related to their eating habits or activity level. They report no significant heartburn. 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    Otherwise, there have been no changes to her past medical history, surgical history, family history, or social history.    Review of systems otherwise negative other than that mentioned in the HPI.    Objective:   Blood pressure 118/72, pulse 86, temperature 98.4 F (36.9 C), resp. rate 16, weight 208 lb 8 oz (94.6 kg), last menstrual period 08/15/2013, SpO2 96%. Body mass index is 41.06  kg/m.    Physical Exam Vitals reviewed.  Constitutional:      Appearance: She is well-developed.     Comments: Lovely and talkative. Bubbly. Hoarse and clearing her throat during the visit.,   HENT:     Head: Normocephalic and atraumatic.     Right Ear: Tympanic membrane, ear canal and external ear normal.     Left Ear: Tympanic membrane, ear canal and external ear normal.     Nose: No nasal deformity, septal deviation, mucosal edema or rhinorrhea.     Right Turbinates: Enlarged, swollen and pale.     Left Turbinates: Enlarged, swollen and pale.     Right Sinus: No maxillary sinus tenderness or frontal sinus tenderness.     Left Sinus: No maxillary sinus tenderness or frontal sinus tenderness.     Comments: No nasal polyps noted.     Mouth/Throat:     Lips: Pink.     Mouth: Mucous membranes are moist. Mucous membranes are not pale and not dry.     Pharynx: Uvula midline.     Comments: No cobblestoning noted today. Eyes:     General:  Lids are normal. No allergic shiner.       Right eye: No discharge.        Left eye: No discharge.     Conjunctiva/sclera: Conjunctivae normal.     Right eye: Right conjunctiva is not injected. No chemosis.    Left eye: Left conjunctiva is not injected. No chemosis.    Pupils: Pupils are equal, round, and reactive to light.  Cardiovascular:     Rate and Rhythm: Normal rate and regular rhythm.     Heart sounds: Normal heart sounds.  Pulmonary:     Effort: Pulmonary effort is normal. No tachypnea, accessory muscle usage or respiratory distress.     Breath sounds: Normal breath sounds. No wheezing, rhonchi or rales.     Comments: Moving air well in all lung fields. No increased work of breathing noted.  Chest:     Chest wall: No tenderness.  Lymphadenopathy:     Cervical: No cervical adenopathy.  Skin:    General: Skin is warm.     Capillary Refill: Capillary refill takes less than 2 seconds.     Coloration: Skin is not pale.     Findings: No  abrasion, erythema, petechiae or rash. Rash is not papular, urticarial or vesicular.     Comments: No eczematous or urticarial lesions noted.   Neurological:     Mental Status: She is alert.  Psychiatric:        Behavior: Behavior is cooperative.      Diagnostic studies:   Spirometry: results abnormal (FEV1: 1.24/57%, FVC: 1.74/64%, FEV1/FVC: 71%).    Spirometry consistent with possible restrictive disease. This is stable for her.    Allergy Studies: none        Malachi Bonds, MD  [facility]

## 2023-02-28 DIAGNOSIS — I83891 Varicose veins of right lower extremities with other complications: Secondary | ICD-10-CM | POA: Diagnosis not present

## 2023-03-14 DIAGNOSIS — I83891 Varicose veins of right lower extremities with other complications: Secondary | ICD-10-CM | POA: Diagnosis not present

## 2023-04-02 ENCOUNTER — Other Ambulatory Visit (HOSPITAL_COMMUNITY): Payer: Self-pay | Admitting: Adult Health

## 2023-04-02 DIAGNOSIS — Z1231 Encounter for screening mammogram for malignant neoplasm of breast: Secondary | ICD-10-CM

## 2023-04-06 ENCOUNTER — Ambulatory Visit (HOSPITAL_COMMUNITY): Payer: BC Managed Care – PPO

## 2023-04-08 DIAGNOSIS — U071 COVID-19: Secondary | ICD-10-CM | POA: Diagnosis not present

## 2023-04-08 DIAGNOSIS — J4521 Mild intermittent asthma with (acute) exacerbation: Secondary | ICD-10-CM | POA: Diagnosis not present

## 2023-04-13 DIAGNOSIS — J4521 Mild intermittent asthma with (acute) exacerbation: Secondary | ICD-10-CM | POA: Diagnosis not present

## 2023-04-23 DIAGNOSIS — I10 Essential (primary) hypertension: Secondary | ICD-10-CM | POA: Diagnosis not present

## 2023-04-23 DIAGNOSIS — E039 Hypothyroidism, unspecified: Secondary | ICD-10-CM | POA: Diagnosis not present

## 2023-04-23 DIAGNOSIS — R7303 Prediabetes: Secondary | ICD-10-CM | POA: Diagnosis not present

## 2023-04-28 ENCOUNTER — Other Ambulatory Visit: Payer: Self-pay | Admitting: Allergy & Immunology

## 2023-05-01 DIAGNOSIS — J4540 Moderate persistent asthma, uncomplicated: Secondary | ICD-10-CM | POA: Diagnosis not present

## 2023-05-01 DIAGNOSIS — J302 Other seasonal allergic rhinitis: Secondary | ICD-10-CM | POA: Diagnosis not present

## 2023-05-01 DIAGNOSIS — R0683 Snoring: Secondary | ICD-10-CM | POA: Diagnosis not present

## 2023-05-01 DIAGNOSIS — Z0000 Encounter for general adult medical examination without abnormal findings: Secondary | ICD-10-CM | POA: Diagnosis not present

## 2023-05-01 DIAGNOSIS — R5383 Other fatigue: Secondary | ICD-10-CM | POA: Diagnosis not present

## 2023-05-03 DIAGNOSIS — G2581 Restless legs syndrome: Secondary | ICD-10-CM | POA: Diagnosis not present

## 2023-05-03 DIAGNOSIS — I872 Venous insufficiency (chronic) (peripheral): Secondary | ICD-10-CM | POA: Diagnosis not present

## 2023-05-03 DIAGNOSIS — I83892 Varicose veins of left lower extremities with other complications: Secondary | ICD-10-CM | POA: Diagnosis not present

## 2023-05-03 DIAGNOSIS — R600 Localized edema: Secondary | ICD-10-CM | POA: Diagnosis not present

## 2023-05-14 ENCOUNTER — Ambulatory Visit (HOSPITAL_COMMUNITY)
Admission: RE | Admit: 2023-05-14 | Discharge: 2023-05-14 | Disposition: A | Discharge status: Home / Self Care | Payer: BC Managed Care – PPO | Source: Ambulatory Visit | Attending: Adult Health | Admitting: Adult Health

## 2023-05-14 DIAGNOSIS — Z1231 Encounter for screening mammogram for malignant neoplasm of breast: Secondary | ICD-10-CM | POA: Diagnosis not present

## 2023-05-30 DIAGNOSIS — I83892 Varicose veins of left lower extremities with other complications: Secondary | ICD-10-CM | POA: Diagnosis not present

## 2023-06-06 ENCOUNTER — Encounter: Payer: Self-pay | Admitting: Allergy & Immunology

## 2023-06-06 ENCOUNTER — Ambulatory Visit (INDEPENDENT_AMBULATORY_CARE_PROVIDER_SITE_OTHER): Payer: BC Managed Care – PPO | Admitting: Allergy & Immunology

## 2023-06-06 VITALS — BP 132/88 | HR 83 | Temp 97.7°F | Resp 14 | Ht <= 58 in | Wt 210.2 lb

## 2023-06-06 DIAGNOSIS — J4540 Moderate persistent asthma, uncomplicated: Secondary | ICD-10-CM | POA: Diagnosis not present

## 2023-06-06 DIAGNOSIS — J3089 Other allergic rhinitis: Secondary | ICD-10-CM | POA: Diagnosis not present

## 2023-06-06 DIAGNOSIS — J302 Other seasonal allergic rhinitis: Secondary | ICD-10-CM | POA: Diagnosis not present

## 2023-06-06 DIAGNOSIS — K219 Gastro-esophageal reflux disease without esophagitis: Secondary | ICD-10-CM

## 2023-06-06 DIAGNOSIS — I83892 Varicose veins of left lower extremities with other complications: Secondary | ICD-10-CM | POA: Diagnosis not present

## 2023-06-06 MED ORDER — AZELASTINE HCL 0.1 % NA SOLN: 1.0000 | Freq: Two times a day (BID) | NASAL | 5 refills | Status: AC | PRN

## 2023-06-06 MED ORDER — BREZTRI AEROSPHERE 160-9-4.8 MCG/ACT IN AERO: 2.0000 | INHALATION_SPRAY | Freq: Two times a day (BID) | RESPIRATORY_TRACT | 1 refills | Status: DC

## 2023-06-06 MED ORDER — CETIRIZINE HCL 10 MG PO TABS: 10.0000 mg | ORAL_TABLET | Freq: Every day | ORAL | 5 refills | Status: AC

## 2023-06-06 MED ORDER — TRIAMCINOLONE ACETONIDE 55 MCG/ACT NA AERO: 1.0000 | INHALATION_SPRAY | Freq: Two times a day (BID) | NASAL | 5 refills | Status: DC | PRN

## 2023-06-06 MED ORDER — ALBUTEROL SULFATE HFA 108 (90 BASE) MCG/ACT IN AERS: INHALATION_SPRAY | RESPIRATORY_TRACT | 1 refills | Status: DC

## 2023-06-06 NOTE — Progress Notes (Signed)
 FOLLOW UP  Date of Service/Encounter:  06/06/23   Assessment:   Moderate persistent asthma, uncomplicated - with worsening control since the last visit   Seasonal and perennial allergic rhinitis (grass, trees, mold, dust mite, and cat)  Adverse reaction to Singulair  (throat clearing)   Hypothyroidism   Osteopenia - incidental finding on X-ray (treated with calcium and vitamin D  supplementation)    GERD - controlled with Pepcid  today   Rash - improved with cessation of amlodipine  Recent COVID - leading to bronchitis and laryngitis (received steroid)   Plan/Recommendations:   1. Moderate persistent asthma, uncomplicated - Lung testing looked stable.  - I am glad that you tolerated the COVID bout without a problem.  - Call us  if you have problems with the copay card.  - You seem to have a good handle on your symptoms.  - Daily controller medication(s): Breztri  two puffs twice daily  - Prior to physical activity: albuterol  2 puffs 10-15 minutes before physical activity. - Rescue medications: albuterol  4 puffs every 4-6 hours as needed - Asthma control goals:  * Full participation in all desired activities (may need albuterol  before activity) * Albuterol  use two time or less a week on average (not counting use with activity) * Cough interfering with sleep two time or less a month * Oral steroids no more than once a year * No hospitalizations  2. Perennial allergic rhinitis (grass, trees, mold, dust mite, and cat) - Continue with cetirizine  10mg  daily - Continue with Nasacort  one spray per nostril daily AS NEEDED. - Continue with Astelin  one spray per nostril twice daily AS NEEDED.  3. Concern for uncontrolled GERD - Continue with Pepcid  twice daily.  4. Osteopenia - Recent DEXA scan normal in July 2024.   5. Return in about 6 months (around 12/04/2023). You can have the follow up appointment with Dr. Iva or a Nurse Practicioner (our Nurse Practitioners are  excellent and always have Physician oversight!).   Subjective:   Valerie Henderson is a 58 y.o. female presenting today for follow up of  Chief Complaint  Patient presents with   Follow-up    Valerie Henderson has a history of the following: Patient Active Problem List   Diagnosis Date Noted   Varicose veins of both lower extremities 12/26/2022   S/P abdominal supracervical subtotal hysterectomy 12/26/2022   Encounter for routine gynecological examination with Papanicolaou smear of cervix 12/26/2022   Gastroesophageal reflux disease 03/14/2021   Encounter for screening fecal occult blood testing 08/23/2020   Encounter for gynecological examination with Papanicolaou smear of cervix 02/14/2019   Vaginal dryness 02/01/2017   Seasonal and perennial allergic rhinitis 12/26/2016   Moderate persistent asthma without complication 12/26/2016   Elevated BP 06/10/2015   Post-operative state 10/22/2013   Endometriosis of pelvis 09/16/2013   Cervical os stenosis 08/06/2013   Menorrhagia 08/06/2013   Screening for colorectal cancer 01/14/2013   Pre-op testing 12/25/2012   Fibromyalgia 12/16/2012   Irregular bleeding 12/05/2012   Hypothyroid 12/05/2012   Constipation 12/05/2012    History obtained from: chart review and patient.  Discussed the use of AI scribe software for clinical note transcription with the patient and/or guardian, who gave verbal consent to proceed.  Valerie Henderson is a 59 y.o. female presenting for a follow up visit.  She was last seen in October 2024.  At that time, her lung function looks stable.  We added on omeprazole  at night to see if that helped control her reflux a  bit more.  We continue with her Breztri  2 puffs twice daily and albuterol  as needed.  For her rhinitis, continue with cetirizine  as well as Nasacort  and Astelin .  We did order a DEXA scan to look for bone mineralization. This was normal.   Valerie Henderson is a 58 year old female who presents for follow-up after COVID-19  infection. She recently contracted COVID-19 and managed her symptoms at home without hospitalization. Her symptoms were mild, including intermittent headaches, and she noted that her experience was less severe compared to others she knows. She used her usual bronchitis regimen, including Breztri , and did not take Paxlovid.  Asthma/Respiratory Symptom History: She has a history of recurrent bronchitis, particularly during the Christmas season, which she associates with cold air and exposure to Christmas decorations. She typically experiences laryngitis during these episodes and has used steroids in the past for treatment. She has not required steroids for breathing issues since her last visit. No recent exacerbations of bronchitis symptoms or new breathing difficulties. She remains on the Breztri  two puffs BID. The copay card is still working.   Allergic Rhinitis Symptom History: She is doing well from an allergic rhinitis standpoint.   GERD Symptom History: She manages gastroesophageal reflux disease (GERD) with Pepcid , taken twice daily. She discontinued Singulair  after it caused throat clearing, which resolved upon cessation. She prefers not to use proton pump inhibitors due to concerns about bone mineralization.  Her family history includes her mother, who is on bisphosphonates following a hip and femur fracture, indicating a possible genetic predisposition to bone density issues. She has been taking vitamin D  and calcium supplements, and her recent DEXA scan showed normal bone mineral density.  She is going to be going on an [place] cruise in May 2025. She is going with 12 other people in total. She is going to be flying directly into [place].   Otherwise, there have been no changes to her past medical history, surgical history, family history, or social history.    Review of systems otherwise negative other than that mentioned in the HPI.    Objective:   Blood pressure 132/88, pulse 83,  temperature 97.7 F (36.5 C), resp. rate 14, height 4' 10 (1.473 m), weight 210 lb 4 oz (95.4 kg), last menstrual period 08/15/2013, SpO2 95%. Body mass index is 43.94 kg/m.    Physical Exam Vitals reviewed.  Constitutional:      Appearance: She is well-developed.     Comments: Lovely and talkative. Bubbly.   HENT:     Head: Normocephalic and atraumatic.     Right Ear: Tympanic membrane, ear canal and external ear normal.     Left Ear: Tympanic membrane, ear canal and external ear normal.     Nose: No nasal deformity, septal deviation, mucosal edema or rhinorrhea.     Right Turbinates: Enlarged, swollen and pale.     Left Turbinates: Enlarged, swollen and pale.     Right Sinus: No maxillary sinus tenderness or frontal sinus tenderness.     Left Sinus: No maxillary sinus tenderness or frontal sinus tenderness.     Comments: No nasal polyps noted.     Mouth/Throat:     Lips: Pink.     Mouth: Mucous membranes are moist. Mucous membranes are not pale and not dry.     Pharynx: Uvula midline.     Comments: No cobblestoning noted today. Eyes:     General: Lids are normal. No allergic shiner.  Right eye: No discharge.        Left eye: No discharge.     Conjunctiva/sclera: Conjunctivae normal.     Right eye: Right conjunctiva is not injected. No chemosis.    Left eye: Left conjunctiva is not injected. No chemosis.    Pupils: Pupils are equal, round, and reactive to light.  Cardiovascular:     Rate and Rhythm: Normal rate and regular rhythm.     Heart sounds: Normal heart sounds.  Pulmonary:     Effort: Pulmonary effort is normal. No tachypnea, accessory muscle usage or respiratory distress.     Breath sounds: Normal breath sounds. No wheezing, rhonchi or rales.     Comments: Moving air well in all lung fields. No increased work of breathing noted.  Chest:     Chest wall: No tenderness.  Lymphadenopathy:     Cervical: No cervical adenopathy.  Skin:    General: Skin is warm.      Capillary Refill: Capillary refill takes less than 2 seconds.     Coloration: Skin is not pale.     Findings: No abrasion, erythema, petechiae or rash. Rash is not papular, urticarial or vesicular.     Comments: No eczematous or urticarial lesions noted.   Neurological:     Mental Status: She is alert.  Psychiatric:        Behavior: Behavior is cooperative.      Diagnostic studies:    Spirometry: results abnormal (FEV1: 1.27/60%, FVC: 1.72/66%, FEV1/FVC: 74%).    Spirometry consistent with possible restrictive disease. This is stable for her.   Allergy Studies: none       Marty Shaggy, MD  [facility] 

## 2023-06-06 NOTE — Patient Instructions (Addendum)
`  1. Moderate persistent asthma, uncomplicated - Lung testing looked stable.  - I am glad that you tolerated the COVID bout without a problem.  - Call us  if you have problems with the copay card.  - You seem to have a good handle on your symptoms.  - Daily controller medication(s): Breztri  two puffs twice daily  - Prior to physical activity: albuterol  2 puffs 10-15 minutes before physical activity. - Rescue medications: albuterol  4 puffs every 4-6 hours as needed - Asthma control goals:  * Full participation in all desired activities (may need albuterol  before activity) * Albuterol  use two time or less a week on average (not counting use with activity) * Cough interfering with sleep two time or less a month * Oral steroids no more than once a year * No hospitalizations  2. Perennial allergic rhinitis (grass, trees, mold, dust mite, and cat) - Continue with cetirizine  10mg  daily - Continue with Nasacort  one spray per nostril daily AS NEEDED. - Continue with Astelin  one spray per nostril twice daily AS NEEDED.  3. Concern for uncontrolled GERD - Continue with Pepcid  twice daily.  4. Osteopenia - Recent DEXA scan normal in July 2024.   5. Return in about 6 months (around 12/04/2023). You can have the follow up appointment with Dr. Iva or a Nurse Practicioner (our Nurse Practitioners are excellent and always have Physician oversight!).    Please inform us  of any Emergency Department visits, hospitalizations, or changes in symptoms. Call us  before going to the ED for breathing or allergy symptoms since we might be able to fit you in for a sick visit. Feel free to contact us  anytime with any questions, problems, or concerns.  It was a pleasure to see you again today!  Websites that have reliable patient information: 1. American Academy of Asthma, Allergy, and Immunology: www.aaaai.org 2. Food Allergy Research and Education (FARE): foodallergy.org 3. Mothers of Asthmatics:  http://www.asthmacommunitynetwork.org 4. American College of Allergy, Asthma, and Immunology: www.acaai.org      "Like" us  on Facebook and Instagram for our latest updates!      A healthy democracy works best when [employer] participate! Make sure you are registered to vote! If you have moved or changed any of your contact information, you will need to get this updated before voting! Scan the QR codes below to learn more!

## 2023-06-07 ENCOUNTER — Telehealth: Payer: Self-pay

## 2023-06-07 MED ORDER — LEVOCETIRIZINE DIHYDROCHLORIDE 5 MG PO TABS: 5.0000 mg | ORAL_TABLET | Freq: Every evening | ORAL | 1 refills | Status: DC

## 2023-06-07 NOTE — Telephone Encounter (Signed)
 Patient called in - DOB/Pharmacy verified - stated she has been using Levocetirizine (Xyzal ) instead of Cetirizine  (Zyrtec ).  Patient requested Levocetirizine5 mg prescription be sent into [facility] [address]  Forwarding updated message to provider.

## 2023-06-08 NOTE — Telephone Encounter (Signed)
Oh great thank you!

## 2023-06-20 DIAGNOSIS — Z09 Encounter for follow-up examination after completed treatment for conditions other than malignant neoplasm: Secondary | ICD-10-CM | POA: Diagnosis not present

## 2023-06-20 DIAGNOSIS — I83892 Varicose veins of left lower extremities with other complications: Secondary | ICD-10-CM | POA: Diagnosis not present

## 2023-07-04 DIAGNOSIS — I83892 Varicose veins of left lower extremities with other complications: Secondary | ICD-10-CM | POA: Diagnosis not present

## 2023-07-25 DIAGNOSIS — I83892 Varicose veins of left lower extremities with other complications: Secondary | ICD-10-CM | POA: Diagnosis not present

## 2023-08-28 DIAGNOSIS — I781 Nevus, non-neoplastic: Secondary | ICD-10-CM | POA: Diagnosis not present

## 2023-09-05 ENCOUNTER — Telehealth: Payer: Self-pay | Admitting: Allergy & Immunology

## 2023-09-05 NOTE — Telephone Encounter (Signed)
 Pt stated she is going on cruise and wanted advice on altitude medication.

## 2023-09-05 NOTE — Telephone Encounter (Signed)
 Called and spoke to patient and she expressed that she wanted to know if she could take Diamox Sequels with her asthma issues. Patient is going to call her PCP to see if it'll be prescribed but wanted to ensure it's okay to take. Please advise if this mediation is okay to with her asthma and if it'll help with the altitude in [place] . Thank you!

## 2023-09-06 DIAGNOSIS — M79661 Pain in right lower leg: Secondary | ICD-10-CM | POA: Diagnosis not present

## 2023-09-06 NOTE — Telephone Encounter (Signed)
 Spoke with the patient and informed her that Dr. Idolina Maker said it was okay to take Diamox with current asthma symptoms. Verbalized understanding.

## 2023-09-06 NOTE — Telephone Encounter (Signed)
 Spoke with Valerie Henderson and informed her that Dr. Idolina Maker would not be able to prescribe diamox and she would have to reach out to her PCP. Verbalized understanding.

## 2023-09-06 NOTE — Telephone Encounter (Signed)
 Orvella called back and wants to know if Dr. Idolina Maker will prescribe Diamox?  If so, she would like this sent to [facility] in [place] on [address]

## 2023-09-06 NOTE — Telephone Encounter (Signed)
 I am so excited about her [place] trip!  It is fine to take that with asthma.

## 2023-09-11 DIAGNOSIS — L989 Disorder of the skin and subcutaneous tissue, unspecified: Secondary | ICD-10-CM | POA: Diagnosis not present

## 2023-09-11 DIAGNOSIS — I10 Essential (primary) hypertension: Secondary | ICD-10-CM | POA: Diagnosis not present

## 2023-09-11 DIAGNOSIS — I8393 Asymptomatic varicose veins of bilateral lower extremities: Secondary | ICD-10-CM | POA: Diagnosis not present

## 2023-10-08 ENCOUNTER — Ambulatory Visit
Admission: EM | Admit: 2023-10-08 | Discharge: 2023-10-08 | Disposition: A | Discharge status: Home / Self Care | Attending: Family Medicine | Admitting: Family Medicine

## 2023-10-08 DIAGNOSIS — S29012A Strain of muscle and tendon of back wall of thorax, initial encounter: Secondary | ICD-10-CM

## 2023-10-08 MED ORDER — TIZANIDINE HCL 4 MG PO CAPS: 4.0000 mg | ORAL_CAPSULE | Freq: Three times a day (TID) | ORAL | 0 refills | Status: DC | PRN

## 2023-10-08 MED ORDER — KETOROLAC TROMETHAMINE 30 MG/ML IJ SOLN
30.0000 mg | Freq: Once | INTRAMUSCULAR | Status: AC
  Administered 2023-10-08: 30 mg via INTRAMUSCULAR

## 2023-10-08 NOTE — ED Provider Notes (Signed)
 [facility]    CSN: [id number] Arrival date & time: 10/08/23  7829      History   Chief Complaint No chief complaint on file.   HPI Valerie Henderson is a 58 y.o. female.   Presenting today with several day history of left mid to upper back pain worse with certain movements since moving some heavy tree limbs and logs.  Denies radiation of pain, numbness, tingling, weakness, bowel or bladder incontinence, saddle anesthesia, known injury to the area.  So far trying over-the-counter pain relievers with mild temporary benefit.    Past Medical History:  Diagnosis Date   Anemia    Asthma    Complication of anesthesia    Constipation 12/05/2012   Elevated BP 06/10/2015   Endometriosis    Fibromyalgia 12/16/2012   GERD (gastroesophageal reflux disease)    Hypothyroid 12/05/2012   Hypothyroidism    Insomnia    Irregular bleeding 12/05/2012   Endometrial  ablation 9/14   PONV (postoperative nausea and vomiting)     Patient Active Problem List   Diagnosis Date Noted   Varicose veins of both lower extremities 12/26/2022   S/P abdominal supracervical subtotal hysterectomy 12/26/2022   Encounter for routine gynecological examination with Papanicolaou smear of cervix 12/26/2022   Gastroesophageal reflux disease 03/14/2021   Encounter for screening fecal occult blood testing 08/23/2020   Encounter for gynecological examination with Papanicolaou smear of cervix 02/14/2019   Vaginal dryness 02/01/2017   Seasonal and perennial allergic rhinitis 12/26/2016   Moderate persistent asthma without complication 12/26/2016   Elevated BP 06/10/2015   Post-operative state 10/22/2013   Endometriosis of pelvis 09/16/2013   Cervical os stenosis 08/06/2013   Menorrhagia 08/06/2013   Screening for colorectal cancer 01/14/2013   Pre-op testing 12/25/2012   Fibromyalgia 12/16/2012   Irregular bleeding 12/05/2012   Hypothyroid 12/05/2012   Constipation 12/05/2012    Past Surgical History:   Procedure Laterality Date   CHOLECYSTECTOMY     DILITATION & CURRETTAGE/HYSTROSCOPY WITH THERMACHOICE ABLATION N/A 01/14/2013   Procedure: DILATATION & CURETTAGE/HYSTEROSCOPY WITH THERMACHOICE ABLATION;  Surgeon: Albino Hum, MD;  Location: [facility];  Service: Gynecology;  Laterality: N/A;  8 ml D5W total in, 8 ml D5W total out; 86-88 degrees Celcius; 8 minutes and 39 seconds total time   LAPAROSCOPIC BILATERAL SALPINGECTOMY Bilateral 01/14/2013   Procedure: LAPAROSCOPIC BILATERAL SALPINGECTOMY;  Surgeon: Albino Hum, MD;  Location: [facility];  Service: Gynecology;  Laterality: Bilateral;   OOPHORECTOMY Bilateral 09/16/2013   Procedure: OOPHORECTOMY;  Surgeon: Albino Hum, MD;  Location: [facility];  Service: Gynecology;  Laterality: Bilateral;   SUPRACERVICAL ABDOMINAL HYSTERECTOMY N/A 09/16/2013   Procedure: HYSTERECTOMY SUPRACERVICAL ABDOMINAL;  Surgeon: Albino Hum, MD;  Location: [facility];  Service: Gynecology;  Laterality: N/A;    OB History     Gravida  1   Para  1   Term      Preterm      AB      Living  1      SAB      IAB      Ectopic      Multiple      Live Births  1            Home Medications    Prior to Admission medications   Medication Sig Start Date End Date Taking? Authorizing Provider  montelukast  (SINGULAIR ) 10 MG tablet TAKE 1 TABLET(10 MG) BY MOUTH AT BEDTIME Patient not taking: Reported on  06/06/2023 04/30/23   Rochester Chuck, MD  tiZANidine (ZANAFLEX) 4 MG capsule Take 1 capsule (4 mg total) by mouth 3 (three) times daily as needed for muscle spasms. Do not drink alcohol or drive while taking this medication.  May cause drowsiness. 10/08/23  Yes Corbin Dess, PA-C  albuterol  (VENTOLIN  HFA) 108 [phone] Base) MCG/ACT inhaler INHALE 2 PUFFS INTO THE LUNGS EVERY 4 TO 6 HOURS AS NEEDED FOR COUGH OR WHEEZING OR SHORTNESS OF BREATH OR CHEST TIGHTNESS 06/06/23   Rochester Chuck, MD  azelastine  (ASTELIN ) 0.1 % nasal spray Place 1  spray into both nostrils 2 (two) times daily as needed for rhinitis. 06/06/23   Rochester Chuck, MD  Budeson-Glycopyrrol-Formoterol  (BREZTRI  AEROSPHERE) 160-9-4.8 MCG/ACT AERO Inhale 2 puffs into the lungs in the morning and at bedtime. 06/06/23   Rochester Chuck, MD  cetirizine  (ZYRTEC  ALLERGY) 10 MG tablet Take 1 tablet (10 mg total) by mouth daily. 06/06/23   Rochester Chuck, MD  cholecalciferol (VITAMIN D ) 1000 units tablet Take 1,000 Units by mouth daily.    [provider]  famotidine  (PEPCID ) 20 MG tablet Take 1 tablet (20 mg total) by mouth 2 (two) times daily. 02/21/23   Rochester Chuck, MD  FOLIC ACID PO Take by mouth daily.    [provider]  levocetirizine (XYZAL ) 5 MG tablet Take 1 tablet (5 mg total) by mouth every evening. 06/07/23   Rochester Chuck, MD  levothyroxine  (SYNTHROID ) 137 MCG tablet  12/20/18   [provider]  MAGNESIUM PO Take by mouth daily.    [provider]  Multiple Vitamin (MULTIVITAMIN) tablet Take 1 tablet by mouth daily.    [provider]  olmesartan (BENICAR) 20 MG tablet Take 20 mg by mouth daily. 09/08/21   [provider]  omeprazole  (PRILOSEC) 40 MG capsule Take 1 capsule (40 mg total) by mouth daily. Patient not taking: Reported on 06/06/2023 02/21/23   Rochester Chuck, MD  triamcinolone  (NASACORT ) 55 MCG/ACT AERO nasal inhaler Place 1 spray into the nose 2 (two) times daily as needed. 06/06/23   Rochester Chuck, MD  UNABLE TO FIND Vit C gummies-takes 2 daily    [provider]  valACYclovir HCl (VALTREX PO) Take by mouth as needed.    [provider]  zolpidem  (AMBIEN ) 5 MG tablet take 1 tablet by mouth at bedtime if needed 05/24/17   [provider]    Family History Family History  Problem Relation Age of Onset   Heart disease Mother        heart attack   Hypertension Mother    Heart disease Father        heart attack   Hypertension  Father    Cancer Maternal Aunt        breast   Cancer Maternal Grandmother        uterine   Cancer Maternal Grandfather        lung   Cancer Paternal Grandmother        uterine   Hypertension Brother    Hypertension Sister    Hypertension Other    Colon cancer Neg Hx    Allergic rhinitis Neg Hx    Angioedema Neg Hx    Asthma Neg Hx    Atopy Neg Hx    Eczema Neg Hx    Immunodeficiency Neg Hx    Urticaria Neg Hx     Social History Social History   Tobacco Use  Smoking status: Former    Types: Cigarettes   Smokeless tobacco: Never   Tobacco comments:    only used for a few years during her teens   Vaping Use   Vaping status: Never Used  Substance Use Topics   Alcohol use: Yes    Comment: occassional   Drug use: No     Allergies   Amlodipine and Neosporin [neomycin-bacitracin zn-polymyx]   Review of Systems Review of Systems Per HPI  Physical Exam Triage Vital Signs ED Triage Vitals  Encounter Vitals Group     BP 10/08/23 0839 (!) 141/84     Systolic BP Percentile --      Diastolic BP Percentile --      Pulse Rate 10/08/23 0839 79     Resp 10/08/23 0839 18     Temp 10/08/23 0839 (!) 97.4 F (36.3 C)     Temp Source 10/08/23 0839 Oral     SpO2 10/08/23 0839 94 %     Weight --      Height --      Head Circumference --      Peak Flow --      Pain Score 10/08/23 0842 8     Pain Loc --      Pain Education --      Exclude from Growth Chart --    No data found.  Updated Vital Signs BP (!) 141/84 (BP Location: Right Arm)   Pulse 79   Temp (!) 97.4 F (36.3 C) (Oral)   Resp 18   LMP 08/15/2013 Comment: SCH  SpO2 94%   Visual Acuity Right Eye Distance:   Left Eye Distance:   Bilateral Distance:    Right Eye Near:   Left Eye Near:    Bilateral Near:     Physical Exam Vitals and nursing note reviewed.  Constitutional:      Appearance: Normal appearance. She is not ill-appearing.  HENT:     Head: Atraumatic.     Mouth/Throat:     Mouth:  Mucous membranes are moist.  Eyes:     Extraocular Movements: Extraocular movements intact.     Conjunctiva/sclera: Conjunctivae normal.  Cardiovascular:     Rate and Rhythm: Normal rate and regular rhythm.     Heart sounds: Normal heart sounds.  Pulmonary:     Effort: Pulmonary effort is normal.     Breath sounds: Normal breath sounds.  Musculoskeletal:        General: Tenderness present. No swelling. Normal range of motion.     Cervical back: Normal range of motion and neck supple.     Comments: No midline spinal tenderness to palpation diffusely.  Left lateral mid back tender to palpation and mild spasm range of motion of all 4 extremities intact  Skin:    General: Skin is warm and dry.  Neurological:     Mental Status: She is alert and oriented to person, place, and time.     Motor: No weakness.     Gait: Gait normal.     Comments: Bilateral lower extremities neurovascularly intact  Psychiatric:        Mood and Affect: Mood normal.        Thought Content: Thought content normal.        Judgment: Judgment normal.      UC Treatments / Results  Labs (all labs ordered are listed, but only abnormal results are displayed) Labs Reviewed - No data to display  EKG   Radiology  No results found.  Procedures Procedures (including critical care time)  Medications Ordered in UC Medications  ketorolac  (TORADOL ) 30 MG/ML injection 30 mg (30 mg Intramuscular Given 10/08/23 0914)    Initial Impression / Assessment and Plan / UC Course  I have reviewed the triage vital signs and the nursing notes.  Pertinent labs & imaging results that were available during my care of the patient were reviewed by me and considered in my medical decision making (see chart for details).     Consistent with back strain.  Treat with IM Toradol , Zanaflex, heat, massage, stretches.  Return for worsening symptoms.  Final Clinical Impressions(s) / UC Diagnoses   Final diagnoses:  Strain of  mid-back, initial encounter     Discharge Instructions      We have given you a shot of Toradol  today for pain and inflammation, avoid taking ibuprofen  or Aleve type medications over-the-counter for the next 48 hours but you may continue taking Tylenol  as needed for breakthrough pain.  I have prescribed a muscle relaxer to be used as needed with caution as it can cause drowsiness.  You may also use warm Epsom salt baths, massage, heat, lidocaine  patches  ED Prescriptions     Medication Sig Dispense Auth. Provider   tiZANidine (ZANAFLEX) 4 MG capsule Take 1 capsule (4 mg total) by mouth 3 (three) times daily as needed for muscle spasms. Do not drink alcohol or drive while taking this medication.  May cause drowsiness. 15 capsule Corbin Dess, [place]      PDMP not reviewed this encounter.   Corbin Dess, [place] 10/08/23 1951

## 2023-10-08 NOTE — ED Triage Notes (Signed)
 Pt report mid back pain, x 1 day.

## 2023-10-08 NOTE — Discharge Instructions (Signed)
 We have given you a shot of Toradol  today for pain and inflammation, avoid taking ibuprofen  or Aleve type medications over-the-counter for the next 48 hours but you may continue taking Tylenol  as needed for breakthrough pain.  I have prescribed a muscle relaxer to be used as needed with caution as it can cause drowsiness.  You may also use warm Epsom salt baths, massage, heat, lidocaine  patches

## 2023-10-11 DIAGNOSIS — J4540 Moderate persistent asthma, uncomplicated: Secondary | ICD-10-CM | POA: Diagnosis not present

## 2023-10-11 DIAGNOSIS — I10 Essential (primary) hypertension: Secondary | ICD-10-CM | POA: Diagnosis not present

## 2023-10-11 DIAGNOSIS — J302 Other seasonal allergic rhinitis: Secondary | ICD-10-CM | POA: Diagnosis not present

## 2023-10-11 DIAGNOSIS — S29012A Strain of muscle and tendon of back wall of thorax, initial encounter: Secondary | ICD-10-CM | POA: Diagnosis not present

## 2023-10-11 DIAGNOSIS — S29012D Strain of muscle and tendon of back wall of thorax, subsequent encounter: Secondary | ICD-10-CM | POA: Diagnosis not present

## 2023-10-23 DIAGNOSIS — R7303 Prediabetes: Secondary | ICD-10-CM | POA: Diagnosis not present

## 2023-10-23 DIAGNOSIS — E039 Hypothyroidism, unspecified: Secondary | ICD-10-CM | POA: Diagnosis not present

## 2023-10-23 DIAGNOSIS — I10 Essential (primary) hypertension: Secondary | ICD-10-CM | POA: Diagnosis not present

## 2023-10-29 DIAGNOSIS — N1831 Chronic kidney disease, stage 3a: Secondary | ICD-10-CM | POA: Diagnosis not present

## 2023-10-29 DIAGNOSIS — E782 Mixed hyperlipidemia: Secondary | ICD-10-CM | POA: Diagnosis not present

## 2023-10-29 DIAGNOSIS — E039 Hypothyroidism, unspecified: Secondary | ICD-10-CM | POA: Diagnosis not present

## 2023-10-29 DIAGNOSIS — I129 Hypertensive chronic kidney disease with stage 1 through stage 4 chronic kidney disease, or unspecified chronic kidney disease: Secondary | ICD-10-CM | POA: Diagnosis not present

## 2023-10-29 DIAGNOSIS — S29012D Strain of muscle and tendon of back wall of thorax, subsequent encounter: Secondary | ICD-10-CM | POA: Diagnosis not present

## 2023-10-31 DIAGNOSIS — M9902 Segmental and somatic dysfunction of thoracic region: Secondary | ICD-10-CM | POA: Diagnosis not present

## 2023-10-31 DIAGNOSIS — M546 Pain in thoracic spine: Secondary | ICD-10-CM | POA: Diagnosis not present

## 2023-11-07 DIAGNOSIS — N1831 Chronic kidney disease, stage 3a: Secondary | ICD-10-CM | POA: Diagnosis not present

## 2023-11-19 DIAGNOSIS — E039 Hypothyroidism, unspecified: Secondary | ICD-10-CM | POA: Diagnosis not present

## 2023-11-19 DIAGNOSIS — I10 Essential (primary) hypertension: Secondary | ICD-10-CM | POA: Diagnosis not present

## 2023-11-19 DIAGNOSIS — N1831 Chronic kidney disease, stage 3a: Secondary | ICD-10-CM | POA: Diagnosis not present

## 2023-11-19 DIAGNOSIS — I129 Hypertensive chronic kidney disease with stage 1 through stage 4 chronic kidney disease, or unspecified chronic kidney disease: Secondary | ICD-10-CM | POA: Diagnosis not present

## 2023-11-22 DIAGNOSIS — N1831 Chronic kidney disease, stage 3a: Secondary | ICD-10-CM | POA: Diagnosis not present

## 2023-11-22 DIAGNOSIS — E039 Hypothyroidism, unspecified: Secondary | ICD-10-CM | POA: Diagnosis not present

## 2023-12-03 ENCOUNTER — Other Ambulatory Visit: Payer: Self-pay | Admitting: Allergy & Immunology

## 2023-12-04 ENCOUNTER — Telehealth: Payer: Self-pay

## 2023-12-04 ENCOUNTER — Other Ambulatory Visit: Payer: Self-pay | Admitting: Allergy & Immunology

## 2023-12-04 ENCOUNTER — Other Ambulatory Visit (HOSPITAL_COMMUNITY): Payer: Self-pay

## 2023-12-04 NOTE — Telephone Encounter (Signed)
*  [facility]  Pharmacy Patient Advocate Encounter   Received notification from CoverMyMeds that prior authorization for Breztri  Aerosphere 160-9-4.8MCG/ACT aerosol  is required/requested.   Insurance verification completed.   The patient is insured through [facility] .   Per test claim: PA required; PA submitted to above mentioned insurance via CoverMyMeds Key/confirmation #/EOC [id number] Status is pending

## 2023-12-05 ENCOUNTER — Encounter: Payer: Self-pay | Admitting: Allergy & Immunology

## 2023-12-05 ENCOUNTER — Ambulatory Visit (INDEPENDENT_AMBULATORY_CARE_PROVIDER_SITE_OTHER): Payer: BC Managed Care – PPO | Admitting: Allergy & Immunology

## 2023-12-05 ENCOUNTER — Other Ambulatory Visit: Payer: Self-pay

## 2023-12-05 VITALS — BP 130/80 | HR 84 | Temp 97.6°F | Resp 18 | Ht <= 58 in | Wt 201.6 lb

## 2023-12-05 DIAGNOSIS — J4540 Moderate persistent asthma, uncomplicated: Secondary | ICD-10-CM | POA: Diagnosis not present

## 2023-12-05 DIAGNOSIS — J3089 Other allergic rhinitis: Secondary | ICD-10-CM

## 2023-12-05 DIAGNOSIS — M8588 Other specified disorders of bone density and structure, other site: Secondary | ICD-10-CM | POA: Diagnosis not present

## 2023-12-05 DIAGNOSIS — J302 Other seasonal allergic rhinitis: Secondary | ICD-10-CM

## 2023-12-05 DIAGNOSIS — K219 Gastro-esophageal reflux disease without esophagitis: Secondary | ICD-10-CM

## 2023-12-05 MED ORDER — BREZTRI AEROSPHERE 160-9-4.8 MCG/ACT IN AERO: 2.0000 | INHALATION_SPRAY | Freq: Two times a day (BID) | RESPIRATORY_TRACT | 1 refills | Status: AC

## 2023-12-05 NOTE — Addendum Note (Signed)
 Addended by: JENEL MARYLYNN GRADE on: 12/05/2023 04:30 PM   Modules accepted: Orders

## 2023-12-05 NOTE — Progress Notes (Signed)
 FOLLOW UP  Date of Service/Encounter:  12/05/23   Assessment:   Moderate persistent asthma, uncomplicated - stable spiro on Breztri    Seasonal and perennial allergic rhinitis (grass, trees, mold, dust mite, and cat)   Adverse reaction to Singulair  (throat clearing)   Hypothyroidism   Osteopenia - incidental finding on X-ray (treated with calcium and vitamin D  supplementation)    GERD - controlled with Pepcid  today   Rash - improved with cessation of amlodipine     Plan/Recommendations:   `1. Moderate persistent asthma, uncomplicated - Lung testing looked very stable.  - Daily controller medication(s): Breztri  two puffs twice daily with spacer - Prior to physical activity: albuterol  2 puffs 10-15 minutes before physical activity. - Rescue medications: albuterol  4 puffs every 4-6 hours as needed - Asthma control goals:  * Full participation in all desired activities (may need albuterol  before activity) * Albuterol  use two time or less a week on average (not counting use with activity) * Cough interfering with sleep two time or less a month * Oral steroids no more than once a year * No hospitalizations  2. Perennial allergic rhinitis (grass, trees, mold, dust mite, and cat) - Continue with cetirizine  10mg  daily. - Continue with Nasacort  one spray per nostril daily AS NEEDED. - Continue with Astelin  one spray per nostril twice daily AS NEEDED.  3. GERD - Continue with Pepcid  twice daily.  4. Osteopenia - Recent DEXA scan normal in July 2024.   5. Return in about 6 months (around 06/06/2024). You can have the follow up appointment with Dr. Iva or a Nurse Practicioner (our Nurse Practitioners are excellent and always have Physician oversight!).    Subjective:   Valerie Henderson is a 58 y.o. female presenting today for follow up of  Chief Complaint  Patient presents with   Follow-up    Valerie Henderson has a history of the following: Patient Active Problem List    Diagnosis Date Noted   Varicose veins of both lower extremities 12/26/2022   S/P abdominal supracervical subtotal hysterectomy 12/26/2022   Encounter for routine gynecological examination with Papanicolaou smear of cervix 12/26/2022   Gastroesophageal reflux disease 03/14/2021   Encounter for screening fecal occult blood testing 08/23/2020   Encounter for gynecological examination with Papanicolaou smear of cervix 02/14/2019   Vaginal dryness 02/01/2017   Seasonal and perennial allergic rhinitis 12/26/2016   Moderate persistent asthma without complication 12/26/2016   Elevated BP 06/10/2015   Post-operative state 10/22/2013   Endometriosis of pelvis 09/16/2013   Cervical os stenosis 08/06/2013   Menorrhagia 08/06/2013   Screening for colorectal cancer 01/14/2013   Pre-op testing 12/25/2012   Fibromyalgia 12/16/2012   Irregular bleeding 12/05/2012   Hypothyroid 12/05/2012   Constipation 12/05/2012    History obtained from: chart review and patient.  Discussed the use of AI scribe software for clinical note transcription with the patient and/or guardian, who gave verbal consent to proceed.  Valerie Henderson is a 58 y.o. female presenting for a follow up visit.  She was last seen in February 2025.  At that time, lung testing was stable.  We continue with Breztri  2 puffs twice daily as well as albuterol  as needed.  For the perennial allergic rhinitis, we will continue with cetirizine  as well as Nasacort  and Astelin .  GERD was controlled with Pepcid  twice a day.  She had a recent DEXA scan which was normal in July 2024.  Since the last visit, she has done well.  She did  go on her [place] cruise and had a great time.  They were gone for a total of 10 days, 7 of which were on the boat.  She does not have another cruise planned right now.  Asthma/Respiratory Symptom History: She reports no recent hospitalizations or exacerbations for her asthma. She uses Breztri , two puffs twice a day, which is  affordable due to a copay card. She previously used Symbicort  but switched due to insurance coverage issues. No nighttime coughing or wheezing is reported.  She does not want to change her dosing at all. 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   Allergic Rhinitis Symptom History: Her allergies are manageable, with symptoms primarily during fall and sometimes spring. She uses nasal sprays as needed and takes Zyrtec  regularly. No allergy symptoms occur in the winter, but she continues her medications year-round due to concerns about lung function  She remains on her famotidine  twice daily for her reflux.  This seems to be controlling her symptoms very well.  Regarding hypertension, she was previously on olmesartan but discontinued it due to a suspected adverse effect on kidney function, as indicated by a decrease in creatinine levels. She is now on amlodipine 5 mg. She reports that her blood pressure does not seem to be running high when she checks it at home.  She experiences sleep issues and uses a mouthguard along with Ambien  2.5 mg nightly, which she finds helpful.     Otherwise, there have been no changes to her past medical history, surgical history, family history, or social history.    Review of systems otherwise negative other than that mentioned in the HPI.    Objective:   Blood pressure 130/80, pulse 84, temperature 97.6 F (36.4 C), temperature source Temporal, resp. rate 18, height 4' 9.87 (1.47 m), weight 201 lb 9.6 oz (91.4 kg), last menstrual period 08/15/2013, SpO2 95%. Body mass index is 42.32 kg/m.    Physical Exam Vitals reviewed.  Constitutional:      Appearance: She is well-developed.     Comments: Lovely and talkative. Bubbly.   HENT:     Head: Normocephalic and atraumatic.     Right Ear: Tympanic membrane, ear canal and external ear normal.     Left Ear:  Tympanic membrane, ear canal and external ear normal.     Nose: No nasal deformity, septal deviation, mucosal edema or rhinorrhea.     Right Turbinates: Enlarged, swollen and pale.     Left Turbinates: Enlarged, swollen and pale.     Right Sinus: No maxillary sinus tenderness or frontal sinus tenderness.     Left Sinus: No maxillary sinus tenderness or frontal sinus tenderness.     Comments: No nasal polyps noted.     Mouth/Throat:     Lips: Pink.     Mouth: Mucous membranes are moist. Mucous membranes are not pale and not dry.     Pharynx: Uvula midline.     Comments: No cobblestoning noted today. Eyes:     General: Lids are normal. No allergic shiner.       Right eye: No discharge.        Left eye: No discharge.     Conjunctiva/sclera: Conjunctivae normal.     Right eye: Right conjunctiva is not injected. No chemosis.    Left eye: Left conjunctiva is not injected. No chemosis.    Pupils: Pupils are equal,  round, and reactive to light.  Cardiovascular:     Rate and Rhythm: Normal rate and regular rhythm.     Heart sounds: Normal heart sounds.  Pulmonary:     Effort: Pulmonary effort is normal. No tachypnea, accessory muscle usage or respiratory distress.     Breath sounds: Normal breath sounds. No wheezing, rhonchi or rales.     Comments: Moving air well in all lung fields. No increased work of breathing noted.  Chest:     Chest wall: No tenderness.  Lymphadenopathy:     Cervical: No cervical adenopathy.  Skin:    General: Skin is warm.     Capillary Refill: Capillary refill takes less than 2 seconds.     Coloration: Skin is not pale.     Findings: No abrasion, erythema, petechiae or rash. Rash is not papular, urticarial or vesicular.     Comments: No eczematous or urticarial lesions noted.   Neurological:     Mental Status: She is alert.  Psychiatric:        Behavior: Behavior is cooperative.      Diagnostic studies:    Spirometry: results abnormal (FEV1: 1.28/61%,  FVC: 1.76/68%, FEV1/FVC: 73%).    Spirometry consistent with possible restrictive disease. This is stable compared to where it normally is.   Allergy Studies: none       Marty Shaggy, MD  [facility] 

## 2023-12-05 NOTE — Telephone Encounter (Signed)
 No Authorization Required.No Authorization Required.Please note this request does not require prior authorization review when within quantity limits of 1 canister per 30 days. Paid claim on file on 12/03/2023. Please contact pharmacy to process prescription when eligible/due for refill(s). Thank you!

## 2023-12-05 NOTE — Patient Instructions (Addendum)
`  1. Moderate persistent asthma, uncomplicated - Lung testing looked very stable.  - Daily controller medication(s): Breztri  two puffs twice daily with spacer - Prior to physical activity: albuterol  2 puffs 10-15 minutes before physical activity. - Rescue medications: albuterol  4 puffs every 4-6 hours as needed - Asthma control goals:  * Full participation in all desired activities (may need albuterol  before activity) * Albuterol  use two time or less a week on average (not counting use with activity) * Cough interfering with sleep two time or less a month * Oral steroids no more than once a year * No hospitalizations  2. Perennial allergic rhinitis (grass, trees, mold, dust mite, and cat) - Continue with cetirizine  10mg  daily. - Continue with Nasacort  one spray per nostril daily AS NEEDED. - Continue with Astelin  one spray per nostril twice daily AS NEEDED.  3. GERD - Continue with Pepcid  twice daily.  4. Osteopenia - Recent DEXA scan normal in July 2024.   5. Return in about 6 months (around 06/06/2024). You can have the follow up appointment with Dr. Iva or a Nurse Practicioner (our Nurse Practitioners are excellent and always have Physician oversight!).    Please inform us  of any Emergency Department visits, hospitalizations, or changes in symptoms. Call us  before going to the ED for breathing or allergy symptoms since we might be able to fit you in for a sick visit. Feel free to contact us  anytime with any questions, problems, or concerns.  It was a pleasure to see you again today!  Websites that have reliable patient information: 1. American Academy of Asthma, Allergy, and Immunology: www.aaaai.org 2. Food Allergy Research and Education (FARE): foodallergy.org 3. Mothers of Asthmatics: http://www.asthmacommunitynetwork.org 4. American College of Allergy, Asthma, and Immunology: www.acaai.org      "Like" us  on Facebook and Instagram for our latest updates!      A  healthy democracy works best when [employer] participate! Make sure you are registered to vote! If you have moved or changed any of your contact information, you will need to get this updated before voting! Scan the QR codes below to learn more!

## 2024-01-04 ENCOUNTER — Other Ambulatory Visit: Payer: Self-pay | Admitting: Allergy & Immunology

## 2024-01-09 ENCOUNTER — Encounter: Payer: Self-pay | Admitting: Adult Health

## 2024-01-09 ENCOUNTER — Other Ambulatory Visit (HOSPITAL_COMMUNITY)
Admission: RE | Admit: 2024-01-09 | Discharge: 2024-01-09 | Disposition: A | Discharge status: Home / Self Care | Source: Ambulatory Visit | Attending: Adult Health | Admitting: Adult Health

## 2024-01-09 ENCOUNTER — Ambulatory Visit: Admitting: Adult Health

## 2024-01-09 VITALS — BP 114/70 | HR 71 | Ht 59.0 in | Wt 203.0 lb

## 2024-01-09 DIAGNOSIS — Z1211 Encounter for screening for malignant neoplasm of colon: Secondary | ICD-10-CM

## 2024-01-09 DIAGNOSIS — Z90711 Acquired absence of uterus with remaining cervical stump: Secondary | ICD-10-CM

## 2024-01-09 DIAGNOSIS — Z01419 Encounter for gynecological examination (general) (routine) without abnormal findings: Secondary | ICD-10-CM | POA: Diagnosis not present

## 2024-01-09 LAB — HEMOCCULT GUIAC POC 1CARD (OFFICE): Fecal Occult Blood, POC: NEGATIVE

## 2024-01-09 NOTE — Progress Notes (Signed)
 Patient ID: Valerie Henderson, female   DOB: December 15, 1965, 58 y.o.   MRN: [id number] History of Present Illness: Graziella is a 58 year old white female, married, sp [facility] in for a well woman gyn exam and she requests a pap.  PCP is Dr Shona   Current Medications, Allergies, Past Medical History, Past Surgical History, Family History and Social History were reviewed in [employer] electronic medical record.     Review of Systems: Patient denies any headaches, hearing loss, fatigue, blurred vision, shortness of breath, chest pain, abdominal pain, problems with bowel movements, urination, or intercourse. No joint pain or mood swings.     Physical Exam:BP 114/70 (BP Location: Left Arm, Patient Position: Sitting, Cuff Size: Large)   Pulse 71   Ht 4' 11 (1.499 m)   Wt 203 lb (92.1 kg)   LMP 08/15/2013 Comment: [facility]  BMI 41.00 kg/m   General:  Well developed, well nourished, no acute distress Skin:  Warm and dry Neck:  Midline trachea, normal thyroid , good ROM, no lymphadenopathy Lungs; Clear to auscultation bilaterally Breast:  No dominant palpable mass, retraction, or nipple discharge Cardiovascular: Regular rate and rhythm Abdomen:  Soft, non tender, no hepatosplenomegaly Pelvic:  External genitalia is normal in appearance, no lesions.  The vagina is normal in appearance. Urethra has no lesions or masses. The cervix is smooth, pap with HR HPV genotyping performed.  Uterus is absent.  No adnexal masses or tenderness noted.Bladder is non tender, no masses felt. Rectal: Good sphincter tone, no polyps, or hemorrhoids felt.  Hemoccult negative. Extremities/musculoskeletal:  No swelling or varicosities noted, no clubbing or cyanosis Psych:  No mood changes, alert and cooperative,seems happy AA is 1 Fall risk is low    01/09/2024    2:34 PM 12/26/2022    9:11 AM 08/23/2020    3:36 PM  Depression screen PHQ 2/9  Decreased Interest 0 0 0  Down, Depressed, Hopeless 0 0 0  PHQ - 2 Score 0 0 0  Altered  sleeping 0 0 0  Tired, decreased energy 0 0 0  Change in appetite 0 0 0  Feeling bad or failure about yourself  0 0 0  Trouble concentrating 0 0 0  Moving slowly or fidgety/restless 0 0 0  Suicidal thoughts 0 0 0  PHQ-9 Score 0 0 0       01/09/2024    2:34 PM 12/26/2022    9:11 AM 08/23/2020    3:36 PM  GAD 7 : Generalized Anxiety Score  Nervous, Anxious, on Edge 0 0 0  Control/stop worrying 0 0 0  Worry too much - different things 0 0 0  Trouble relaxing 0 1 1  Restless 0 1 1  Easily annoyed or irritable 0 0 0  Afraid - awful might happen 0 0 0  Total GAD 7 Score 0 2 2    Upstream - 01/09/24 1447       Pregnancy Intention Screening   Does the patient want to become pregnant in the next year? N/A    Does the patient's partner want to become pregnant in the next year? N/A    Would the patient like to discuss contraceptive options today? N/A      Contraception Wrap Up   Current Method Female Sterilization   [facility]   End Method Female Sterilization   [facility]   Contraception Counseling Provided No          Examination chaperoned by Clarita Salt LPN   Impression  and plan: 1. Encounter for gynecological examination with Papanicolaou smear of cervix (Primary) Pap sent at her request Physical in 1 year Labs with PCP Mammogram was negative 05/14/23 Colonoscopy per GI She is not depressed or anxious  Stay active  - Cytology - PAP( )  2. S/P abdominal supracervical subtotal hysterectomy   3. Encounter for screening fecal occult blood testing Hemoccult was negative

## 2024-01-14 ENCOUNTER — Ambulatory Visit: Payer: Self-pay | Admitting: Adult Health

## 2024-01-14 LAB — CYTOLOGY - PAP
Comment: NEGATIVE
Diagnosis: NEGATIVE
High risk HPV: NEGATIVE

## 2024-01-28 DIAGNOSIS — N1831 Chronic kidney disease, stage 3a: Secondary | ICD-10-CM | POA: Diagnosis not present

## 2024-01-28 DIAGNOSIS — I10 Essential (primary) hypertension: Secondary | ICD-10-CM | POA: Diagnosis not present

## 2024-01-28 DIAGNOSIS — Z23 Encounter for immunization: Secondary | ICD-10-CM | POA: Diagnosis not present

## 2024-01-28 DIAGNOSIS — I129 Hypertensive chronic kidney disease with stage 1 through stage 4 chronic kidney disease, or unspecified chronic kidney disease: Secondary | ICD-10-CM | POA: Diagnosis not present

## 2024-01-28 DIAGNOSIS — E039 Hypothyroidism, unspecified: Secondary | ICD-10-CM | POA: Diagnosis not present

## 2024-06-11 ENCOUNTER — Ambulatory Visit: Admitting: Allergy & Immunology
